# Patient Record
Sex: Female | Born: 1990 | Race: White | Hispanic: No | Marital: Single | Smoking: Current every day smoker
Health system: Southern US, Community
[De-identification: ages and names within clinical notes are randomized; demographics above are authoritative.]

## PROBLEM LIST (undated history)

## (undated) DIAGNOSIS — R569 Unspecified convulsions: Secondary | ICD-10-CM

## (undated) DIAGNOSIS — R519 Headache, unspecified: Secondary | ICD-10-CM

## (undated) DIAGNOSIS — M419 Scoliosis, unspecified: Secondary | ICD-10-CM

## (undated) DIAGNOSIS — R51 Headache: Secondary | ICD-10-CM

## (undated) HISTORY — PX: KNEE SURGERY: SHX244

## (undated) HISTORY — PX: BACK SURGERY: SHX140

---
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
## 2010-06-19 ENCOUNTER — Emergency Department (HOSPITAL_COMMUNITY)
Admission: EM | Admit: 2010-06-19 | Discharge: 2010-06-19 | Disposition: A | Discharge status: Home / Self Care | Payer: Self-pay | Attending: Emergency Medicine | Admitting: Emergency Medicine

## 2010-06-19 DIAGNOSIS — M25579 Pain in unspecified ankle and joints of unspecified foot: Secondary | ICD-10-CM | POA: Insufficient documentation

## 2010-06-19 DIAGNOSIS — M79609 Pain in unspecified limb: Secondary | ICD-10-CM | POA: Insufficient documentation

## 2010-07-03 LAB — URINALYSIS, ROUTINE W REFLEX MICROSCOPIC
Bilirubin Urine: NEGATIVE
Glucose, UA: NEGATIVE mg/dL
Hgb urine dipstick: NEGATIVE
Protein, ur: NEGATIVE mg/dL
pH: 7.5 (ref 5.0–8.0)

## 2010-07-03 LAB — BASIC METABOLIC PANEL
BUN: 6 mg/dL (ref 6–23)
Chloride: 109 mEq/L (ref 96–112)
GFR calc Af Amer: >60 mL/min (ref >60)
Glucose, Bld: 89 mg/dL (ref 70–99)

## 2010-07-03 LAB — POCT CARDIAC MARKERS
CKMB, poc: 2.7 ng/mL (ref 1.0–8.0)
Myoglobin, poc: 63.4 ng/mL (ref 12–200)
Troponin i, poc: <0.05 ng/mL (ref 0.00–0.09)

## 2010-07-03 LAB — CBC
HCT: 32.4 % — ABNORMAL LOW (ref 36.0–46.0)
MCV: 87.7 fL (ref 78.0–100.0)
RBC: 3.69 MIL/uL — ABNORMAL LOW (ref 3.87–5.11)
RDW: 12.5 % (ref 11.5–15.5)

## 2010-07-03 LAB — HEPATIC FUNCTION PANEL
Albumin: 4 g/dL (ref 3.5–5.2)
Alkaline Phosphatase: 72 U/L (ref 39–117)
Bilirubin, Direct: 0.1 mg/dL (ref 0.0–0.3)
Indirect Bilirubin: 0.4 mg/dL (ref 0.3–0.9)
Total Bilirubin: 0.5 mg/dL (ref 0.3–1.2)
Total Protein: 6.8 g/dL (ref 6.0–8.3)

## 2010-07-03 LAB — DIFFERENTIAL
Basophils Absolute: 0 10*3/uL (ref 0.0–0.1)
Eosinophils Absolute: 0.2 10*3/uL (ref 0.0–0.7)
Monocytes Absolute: 0.4 10*3/uL (ref 0.1–1.0)
Monocytes Relative: 7 % (ref 3–12)
Neutro Abs: 3.2 10*3/uL (ref 1.7–7.7)

## 2010-07-03 LAB — LIPASE, BLOOD: Lipase: 29 U/L (ref 11–59)

## 2010-07-03 LAB — PREGNANCY, URINE: Preg Test, Ur: NEGATIVE

## 2010-07-04 LAB — POCT PREGNANCY, URINE: Preg Test, Ur: NEGATIVE

## 2010-07-04 LAB — URINE CULTURE: Colony Count: NO GROWTH

## 2010-07-04 LAB — URINALYSIS, ROUTINE W REFLEX MICROSCOPIC: Specific Gravity, Urine: 1.015 (ref 1.005–1.030)

## 2010-07-07 LAB — URINALYSIS, ROUTINE W REFLEX MICROSCOPIC
Bilirubin Urine: NEGATIVE
Nitrite: NEGATIVE
Protein, ur: NEGATIVE mg/dL
Specific Gravity, Urine: 1.015 (ref 1.005–1.030)

## 2010-07-07 LAB — URINE MICROSCOPIC-ADD ON

## 2010-07-07 LAB — URINE CULTURE: Colony Count: 10000

## 2010-07-07 LAB — PREGNANCY, URINE: Preg Test, Ur: NEGATIVE

## 2011-02-11 ENCOUNTER — Emergency Department (HOSPITAL_COMMUNITY)
Admission: EM | Admit: 2011-02-11 | Discharge: 2011-02-11 | Discharge status: Against Medical Advice | Payer: Self-pay | Attending: Emergency Medicine | Admitting: Emergency Medicine

## 2011-02-11 ENCOUNTER — Emergency Department (HOSPITAL_COMMUNITY): Payer: Self-pay

## 2011-02-11 DIAGNOSIS — Z5320 Procedure and treatment not carried out because of patient's decision for unspecified reasons: Secondary | ICD-10-CM | POA: Insufficient documentation

## 2011-02-11 DIAGNOSIS — M25569 Pain in unspecified knee: Secondary | ICD-10-CM | POA: Insufficient documentation

## 2011-02-11 HISTORY — DX: Scoliosis, unspecified: M41.9

## 2011-02-11 NOTE — ED Provider Notes (Signed)
Pt left before full evaluation   Joya Gaskins, MD 02/11/11 1819

## 2011-02-11 NOTE — ED Notes (Signed)
Pt presents with left knee pain and swelling after falling yesterday. Pt to triage via w/c. NAD at this time.

## 2011-02-11 NOTE — ED Notes (Signed)
Assisted pt to bathroom, pt tolerated well.

## 2011-02-11 NOTE — ED Notes (Signed)
Pt states that she can't wait any longer, states that she has to be somewhere by 6pm, attempts to get pt to stay were unsuccessful, pt states that she needs to leave, will return if needed. ama form explained to and signed by pt

## 2011-02-11 NOTE — ED Notes (Signed)
Pt states that she was running to answer the phone yesterday when she missed a step and fell, pain to left knee area, cms intact distal

## 2011-07-06 ENCOUNTER — Emergency Department (HOSPITAL_COMMUNITY)
Admission: EM | Admit: 2011-07-06 | Discharge: 2011-07-06 | Disposition: A | Discharge status: Home / Self Care | Payer: Self-pay | Attending: Emergency Medicine | Admitting: Emergency Medicine

## 2011-07-06 ENCOUNTER — Encounter (HOSPITAL_COMMUNITY): Payer: Self-pay

## 2011-07-06 DIAGNOSIS — M4120 Other idiopathic scoliosis, site unspecified: Secondary | ICD-10-CM | POA: Insufficient documentation

## 2011-07-06 DIAGNOSIS — F17200 Nicotine dependence, unspecified, uncomplicated: Secondary | ICD-10-CM | POA: Insufficient documentation

## 2011-07-06 DIAGNOSIS — N390 Urinary tract infection, site not specified: Secondary | ICD-10-CM | POA: Insufficient documentation

## 2011-07-06 LAB — URINALYSIS, ROUTINE W REFLEX MICROSCOPIC
Glucose, UA: NEGATIVE mg/dL
Ketones, ur: NEGATIVE mg/dL
Nitrite: POSITIVE — AB
Specific Gravity, Urine: <1.005 — ABNORMAL LOW (ref 1.005–1.030)
Urobilinogen, UA: 0.2 mg/dL (ref 0.0–1.0)
pH: 6.5 (ref 5.0–8.0)

## 2011-07-06 MED ORDER — CIPROFLOXACIN HCL 250 MG PO TABS
500.0000 mg | ORAL_TABLET | Freq: Once | ORAL | Status: AC
  Administered 2011-07-06: 500 mg via ORAL
  Filled 2011-07-06: qty 2

## 2011-07-06 MED ORDER — CIPROFLOXACIN HCL 500 MG PO TABS: 500.0000 mg | ORAL_TABLET | Freq: Two times a day (BID) | ORAL | Status: AC

## 2011-07-06 MED ORDER — PHENAZOPYRIDINE HCL 100 MG PO TABS
200.0000 mg | ORAL_TABLET | Freq: Once | ORAL | Status: AC
  Administered 2011-07-06: 200 mg via ORAL
  Filled 2011-07-06: qty 2

## 2011-07-06 MED ORDER — PHENAZOPYRIDINE HCL 200 MG PO TABS: 200.0000 mg | ORAL_TABLET | Freq: Three times a day (TID) | ORAL | Status: AC

## 2011-07-06 NOTE — ED Provider Notes (Signed)
History   This chart was scribed for Celene Kras, MD by Sofie Rower. The patient was seen in room APA01/APA01 and the patient's care was started at 11:29PM.      CSN: [id number]  Arrival date & time 07/06/11  2212   None     Chief Complaint  Patient presents with  . Abdominal Pain  . Diarrhea    HPI  Judy Graham is a 21 y.o. female Judy Graham is a 21 y.o. female who presents to the Emergency Department complaining of moderate, constant abdominal pain onset today with assoicated symtoms of diarrhea, irregular periods, back pain. Pt has a hx of scoliosis. She denies any increasing pain with urination. She denies any fevers, vomiting diarrhea or constipation. Patient states she did have symptoms of urinary tract infection about a week ago but the symptoms went away.    Past Medical History  Diagnosis Date  . Scoliosis     Past Surgical History  Procedure Date  . Back surgery   . Knee surgery     No family history on file.  History  Substance Use Topics  . Smoking status: Current Everyday Smoker -- 0.5 packs/day  . Smokeless tobacco: Not on file  . Alcohol Use: No    OB History    Grav Para Term Preterm Abortions TAB SAB Ect Mult Living                  Review of Systems  All other systems reviewed and are negative.    10 Systems reviewed and are negative for acute change except as noted in the HPI.   Allergies  Clindamycin/lincomycin  Home Medications   Current Outpatient Rx  Name Route Sig Dispense Refill  . IBUPROFEN 200 MG PO TABS Oral Take 400-600 mg by mouth 3 (three) times daily as needed. FOR PAIN      BP 123/81  Pulse 107  Temp(Src) 98 F (36.7 C) (Oral)  Resp 24  Ht 5\' 3"  (1.6 m)  Wt 110 lb (49.896 kg)  BMI 19.49 kg/m2  SpO2 100%  LMP 06/22/2011  Physical Exam  Nursing note and vitals reviewed. Constitutional: She appears well-developed and well-nourished. No distress.  HENT:  Head: Normocephalic and atraumatic.  Right  Ear: External ear normal.  Left Ear: External ear normal.  Nose: Nose normal.  Eyes: Conjunctivae are normal. Right eye exhibits no discharge. Left eye exhibits no discharge. No scleral icterus.  Neck: Normal range of motion. Neck supple. No tracheal deviation present.  Cardiovascular: Normal rate, regular rhythm, normal heart sounds and intact distal pulses.   Pulmonary/Chest: Effort normal and breath sounds normal. No stridor. No respiratory distress. She has no wheezes. She has no rales.  Abdominal: Soft. Bowel sounds are normal. She exhibits no distension. There is tenderness (Mild suprapubic. ). There is no rebound and no guarding.  Musculoskeletal: She exhibits no edema and no tenderness.       Scoliosis.   Neurological: She is alert. She has normal strength. No sensory deficit. Cranial nerve deficit:  no gross defecits noted. She exhibits normal muscle tone. She displays no seizure activity. Coordination normal.  Skin: Skin is warm and dry. No rash noted.  Psychiatric: She has a normal mood and affect.    ED Course  Procedures (including critical care time)  DIAGNOSTIC STUDIES: Oxygen Saturation is 100% on room air, normal by my interpretation.    COORDINATION OF CARE:     Labs Reviewed  URINALYSIS, ROUTINE W  REFLEX MICROSCOPIC - Abnormal; Notable for the following:    APPearance CLOUDY (*)    Specific Gravity, Urine <1.005 (*)    Hgb urine dipstick LARGE (*)    Nitrite POSITIVE (*)    Leukocytes, UA SMALL (*)    All other components within normal limits  URINE MICROSCOPIC-ADD ON - Abnormal; Notable for the following:    Squamous Epithelial / LPF MANY (*)    Bacteria, UA MANY (*)    All other components within normal limits  POCT PREGNANCY, URINE   No results found.   1. UTI (lower urinary tract infection)     11:31PM- EDP at bedside discusses treatment plan.   MDM  Symptoms are consistent with a urinary tract infection. Patient was treated with a course of  Cipro and Pyridium.     I personally performed the services described in this documentation, which was scribed in my presence.  The recorded information has been reviewed and considered.    Celene Kras, MD 07/07/11 [phone]

## 2011-07-06 NOTE — ED Notes (Signed)
abd pain with diarrhea. C/o irregular periods as well

## 2011-07-06 NOTE — Discharge Instructions (Signed)

## 2011-07-06 NOTE — ED Notes (Signed)
Discharge instructions reviewed with pt, questions answered. Pt verbalized understanding.  

## 2011-07-06 NOTE — ED Notes (Signed)
Reports being on period today.

## 2011-07-06 NOTE — ED Notes (Signed)
Pt stated pain 6/10, pain radiates from mid abdomin to lower abdominal/pelvic region, pain on palpation. Hyperactive bowel sounds in all 4 quadrants.

## 2011-08-29 DIAGNOSIS — Z981 Arthrodesis status: Secondary | ICD-10-CM | POA: Insufficient documentation

## 2011-11-21 ENCOUNTER — Emergency Department (HOSPITAL_COMMUNITY): Payer: Self-pay

## 2011-11-21 ENCOUNTER — Emergency Department (HOSPITAL_COMMUNITY)
Admission: EM | Admit: 2011-11-21 | Discharge: 2011-11-21 | Disposition: A | Discharge status: Home / Self Care | Payer: Self-pay | Attending: Emergency Medicine | Admitting: Emergency Medicine

## 2011-11-21 ENCOUNTER — Encounter (HOSPITAL_COMMUNITY): Payer: Self-pay | Admitting: *Deleted

## 2011-11-21 DIAGNOSIS — R109 Unspecified abdominal pain: Secondary | ICD-10-CM | POA: Insufficient documentation

## 2011-11-21 DIAGNOSIS — R51 Headache: Secondary | ICD-10-CM | POA: Insufficient documentation

## 2011-11-21 DIAGNOSIS — F17200 Nicotine dependence, unspecified, uncomplicated: Secondary | ICD-10-CM | POA: Insufficient documentation

## 2011-11-21 LAB — URINE MICROSCOPIC-ADD ON

## 2011-11-21 LAB — CBC WITH DIFFERENTIAL/PLATELET
Basophils Absolute: 0 10*3/uL (ref 0.0–0.1)
Basophils Relative: 0 % (ref 0–1)
Eosinophils Absolute: 0.1 10*3/uL (ref 0.0–0.7)
Eosinophils Relative: 1 % (ref 0–5)
HCT: 40.9 % (ref 36.0–46.0)
Lymphocytes Relative: 25 % (ref 12–46)
MCH: 30.4 pg (ref 26.0–34.0)
MCHC: 33.5 g/dL (ref 30.0–36.0)
MCV: 90.9 fL (ref 78.0–100.0)
Monocytes Absolute: 0.5 10*3/uL (ref 0.1–1.0)
Platelets: 297 10*3/uL (ref 150–400)
RDW: 13.5 % (ref 11.5–15.5)
WBC: 9 10*3/uL (ref 4.0–10.5)

## 2011-11-21 LAB — COMPREHENSIVE METABOLIC PANEL
ALT: 10 U/L (ref 0–35)
AST: 23 U/L (ref 0–37)
Albumin: 4.2 g/dL (ref 3.5–5.2)
Calcium: 9.4 mg/dL (ref 8.4–10.5)
Creatinine, Ser: 0.61 mg/dL (ref 0.50–1.10)
GFR calc non Af Amer: >90 mL/min (ref >90)
Sodium: 136 mEq/L (ref 135–145)
Total Protein: 7.6 g/dL (ref 6.0–8.3)

## 2011-11-21 LAB — URINALYSIS, ROUTINE W REFLEX MICROSCOPIC
Glucose, UA: NEGATIVE mg/dL
Hgb urine dipstick: NEGATIVE
Ketones, ur: NEGATIVE mg/dL
Protein, ur: NEGATIVE mg/dL
Urobilinogen, UA: 0.2 mg/dL (ref 0.0–1.0)

## 2011-11-21 LAB — POCT PREGNANCY, URINE: Preg Test, Ur: NEGATIVE

## 2011-11-21 MED ORDER — METOCLOPRAMIDE HCL 5 MG/ML IJ SOLN
10.0000 mg | Freq: Once | INTRAMUSCULAR | Status: AC
  Administered 2011-11-21: 10 mg via INTRAVENOUS
  Filled 2011-11-21: qty 2

## 2011-11-21 MED ORDER — DIPHENHYDRAMINE HCL 50 MG/ML IJ SOLN
25.0000 mg | Freq: Once | INTRAMUSCULAR | Status: AC
  Administered 2011-11-21: 25 mg via INTRAVENOUS
  Filled 2011-11-21: qty 1

## 2011-11-21 MED ORDER — KETOROLAC TROMETHAMINE 30 MG/ML IJ SOLN
30.0000 mg | Freq: Once | INTRAMUSCULAR | Status: AC
  Administered 2011-11-21: 30 mg via INTRAVENOUS
  Filled 2011-11-21: qty 1

## 2011-11-21 MED ORDER — SODIUM CHLORIDE 0.9 % IV SOLN
Freq: Once | INTRAVENOUS | Status: AC
  Administered 2011-11-21: 14:00:00 via INTRAVENOUS

## 2011-11-21 NOTE — ED Notes (Signed)
Pt c/o headache with n/v since last night. 

## 2011-11-21 NOTE — ED Notes (Signed)
RN at bedside

## 2011-11-21 NOTE — ED Notes (Signed)
Headache, N/V, abd pain, onset last night.

## 2011-11-21 NOTE — ED Provider Notes (Signed)
History     CSN: [id number]  Arrival date & time 11/21/11  1149   First MD Initiated Contact with Patient 11/21/11 1249      Chief Complaint  Patient presents with  . Headache    (Consider location/radiation/quality/duration/timing/severity/associated sxs/prior treatment) HPI Comments: The patient is a 21 year old woman who benefit her stomach is hurting and she's had vomiting and headache. This started last night. She couldn't sleep because of her pain and headache. She did not eat anything that she thought made her sick.   She hasn't taken anything for her symptoms.  Patient is a 21 y.o. female presenting with abdominal pain. The history is provided by the patient. No language interpreter was used.  Abdominal Pain The primary symptoms of the illness include abdominal pain and nausea. The primary symptoms of the illness do not include fever, vomiting or diarrhea. Primary symptoms comment: Headache The current episode started 6 to 12 hours ago. The onset of the illness was gradual. The problem has not changed since onset. The abdominal pain began 6 to 12 hours ago. The pain came on gradually. The abdominal pain has been unchanged since its onset. The abdominal pain does not radiate. The severity of the abdominal pain is 6/10. The abdominal pain is relieved by nothing. Exacerbated by: Nothing.  Associated with: Headache, felt at the vertex. Symptoms associated with the illness do not include chills.    Past Medical History  Diagnosis Date  . Scoliosis     Past Surgical History  Procedure Date  . Back surgery   . Knee surgery     History reviewed. No pertinent family history.  History  Substance Use Topics  . Smoking status: Current Everyday Smoker -- 0.5 packs/day  . Smokeless tobacco: Not on file  . Alcohol Use: No    OB History    Grav Para Term Preterm Abortions TAB SAB Ect Mult Living                  Review of Systems  Constitutional: Negative.  Negative for  fever and chills.  Eyes: Negative.   Respiratory: Negative.   Cardiovascular: Negative.   Gastrointestinal: Positive for nausea and abdominal pain. Negative for vomiting and diarrhea.  Genitourinary: Negative.   Musculoskeletal: Negative.   Skin: Negative.   Neurological: Positive for headaches.  Psychiatric/Behavioral: Negative.     Allergies  Clindamycin/lincomycin  Home Medications   Current Outpatient Rx  Name Route Sig Dispense Refill  . DULOXETINE HCL 30 MG PO CPEP Oral Take 30 mg by mouth daily.    . IBUPROFEN 200 MG PO TABS Oral Take 400-600 mg by mouth 3 (three) times daily as needed. FOR PAIN      BP 133/91  Pulse 81  Temp 97.9 F (36.6 C) (Oral)  Resp 17  Ht 5\' 3"  (1.6 m)  Wt 100 lb (45.36 kg)  BMI 17.71 kg/m2  SpO2 99%  LMP 10/20/2011  Physical Exam  Nursing note and vitals reviewed. Constitutional: She is oriented to person, place, and time.       Slender young woman in mild distress complaining of headache and abdominal pain.  HENT:  Head: Normocephalic and atraumatic.  Right Ear: External ear normal.  Left Ear: External ear normal.  Nose: Nose normal.  Mouth/Throat: Oropharynx is clear and moist.  Eyes: Conjunctivae and EOM are normal. Pupils are equal, round, and reactive to light.  Neck: Normal range of motion. Neck supple.  Cardiovascular: Normal rate, regular rhythm and normal  heart sounds.   Pulmonary/Chest: Effort normal and breath sounds normal.  Abdominal: Soft. Bowel sounds are normal. She exhibits no distension. There is no tenderness. There is no rebound.  Musculoskeletal: Normal range of motion.  Lymphadenopathy:    She has no cervical adenopathy.  Neurological: She is alert and oriented to person, place, and time.       No sensory or motor deficit.  Skin: Skin is warm and dry. No erythema.  Psychiatric: She has a normal mood and affect. Her behavior is normal.    ED Course  Procedures (including critical care time)   Labs  Reviewed  URINALYSIS, ROUTINE W REFLEX MICROSCOPIC  CBC WITH DIFFERENTIAL  COMPREHENSIVE METABOLIC PANEL  LIPASE, BLOOD   1:10 PM Pt seen --> physical exam performed.  Lab workup ordered.  Antiemetics and pain medicatons ordered.  3:05 PM Results for orders placed during the hospital encounter of 11/21/11  URINALYSIS, ROUTINE W REFLEX MICROSCOPIC      Component Value Range   Color, Urine YELLOW  YELLOW   APPearance HAZY (*) CLEAR   Specific Gravity, Urine <1.005 (*) 1.005 - 1.030   pH 6.0  5.0 - 8.0   Glucose, UA NEGATIVE  NEGATIVE mg/dL   Hgb urine dipstick NEGATIVE  NEGATIVE   Bilirubin Urine NEGATIVE  NEGATIVE   Ketones, ur NEGATIVE  NEGATIVE mg/dL   Protein, ur NEGATIVE  NEGATIVE mg/dL   Urobilinogen, UA 0.2  0.0 - 1.0 mg/dL   Nitrite NEGATIVE  NEGATIVE   Leukocytes, UA SMALL (*) NEGATIVE  CBC WITH DIFFERENTIAL      Component Value Range   WBC 9.0  4.0 - 10.5 K/uL   RBC 4.50  3.87 - 5.11 MIL/uL   Hemoglobin 13.7  12.0 - 15.0 g/dL   HCT 09.8  11.9 - 14.7 %   MCV 90.9  78.0 - 100.0 fL   MCH 30.4  26.0 - 34.0 pg   MCHC 33.5  30.0 - 36.0 g/dL   RDW 82.9  56.2 - 13.0 %   Platelets 297  150 - 400 K/uL   Neutrophils Relative 69  43 - 77 %   Neutro Abs 6.2  1.7 - 7.7 K/uL   Lymphocytes Relative 25  12 - 46 %   Lymphs Abs 2.2  0.7 - 4.0 K/uL   Monocytes Relative 5  3 - 12 %   Monocytes Absolute 0.5  0.1 - 1.0 K/uL   Eosinophils Relative 1  0 - 5 %   Eosinophils Absolute 0.1  0.0 - 0.7 K/uL   Basophils Relative 0  0 - 1 %   Basophils Absolute 0.0  0.0 - 0.1 K/uL  COMPREHENSIVE METABOLIC PANEL      Component Value Range   Sodium 136  135 - 145 mEq/L   Potassium 3.2 (*) 3.5 - 5.1 mEq/L   Chloride 101  96 - 112 mEq/L   CO2 27  19 - 32 mEq/L   Glucose, Bld 99  70 - 99 mg/dL   BUN 7  6 - 23 mg/dL   Creatinine, Ser 8.65  0.50 - 1.10 mg/dL   Calcium 9.4  8.4 - 78.4 mg/dL   Total Protein 7.6  6.0 - 8.3 g/dL   Albumin 4.2  3.5 - 5.2 g/dL   AST 23  0 - 37 U/L   ALT 10  0 -  35 U/L   Alkaline Phosphatase 88  39 - 117 U/L   Total Bilirubin 0.6  0.3 - 1.2  mg/dL   GFR calc non Af Amer >90  >90 mL/min   GFR calc Af Amer >90  >90 mL/min  LIPASE, BLOOD      Component Value Range   Lipase 206 (*) 11 - 59 U/L  POCT PREGNANCY, URINE      Component Value Range   Preg Test, Ur NEGATIVE  NEGATIVE  URINE MICROSCOPIC-ADD ON      Component Value Range   Squamous Epithelial / LPF FEW (*) RARE   WBC, UA 3-6  <3 WBC/hpf   RBC / HPF 0-2  <3 RBC/hpf   Bacteria, UA FEW (*) RARE   Dg Abd Acute W/chest  11/21/2011  *RADIOLOGY REPORT*  Clinical Data: Lower abdominal pain.  ACUTE ABDOMEN SERIES (ABDOMEN 2 VIEW & CHEST 1 VIEW)  Comparison: Chest x-ray 11/11/2009  Findings: Spinal hardware again noted, unchanged.  The bowel gas pattern is normal.  There is no evidence of free intraperitoneal air.  No suspicious radio-opaque calculi or other significant radiographic abnormality is seen. Heart size and mediastinal contours are within normal limits.  Both lungs are clear.  IMPRESSION: No acute findings.  Original Report Authenticated By: Cyndie Chime, M.D.    Lab tests showed lipase elevated at 206.  Lab workup otherwise negative.  She is a nondrinker so it is unclear why her lipase is elevated.  She feels better, no further headache or abdominal pain.  Released, with advice to rest in darkened room, clear liquids today.   1. Headache   2. Abdominal pain            Carleene Cooper III, MD 11/21/11 [phone]

## 2011-11-21 NOTE — ED Notes (Signed)
Family at bedside. 

## 2012-07-10 ENCOUNTER — Emergency Department (HOSPITAL_COMMUNITY)
Admission: EM | Admit: 2012-07-10 | Discharge: 2012-07-10 | Discharge status: Against Medical Advice | Payer: Self-pay | Attending: Emergency Medicine | Admitting: Emergency Medicine

## 2012-07-10 ENCOUNTER — Encounter (HOSPITAL_COMMUNITY): Payer: Self-pay | Admitting: *Deleted

## 2012-07-10 DIAGNOSIS — R1110 Vomiting, unspecified: Secondary | ICD-10-CM | POA: Insufficient documentation

## 2012-07-10 NOTE — ED Notes (Signed)
No answer

## 2012-07-10 NOTE — ED Notes (Signed)
Onset this am with vomiting and diarrhea.

## 2013-09-01 DIAGNOSIS — F4329 Adjustment disorder with other symptoms: Secondary | ICD-10-CM | POA: Insufficient documentation

## 2013-09-05 DIAGNOSIS — F419 Anxiety disorder, unspecified: Secondary | ICD-10-CM | POA: Insufficient documentation

## 2013-10-24 ENCOUNTER — Emergency Department (HOSPITAL_COMMUNITY)
Admission: EM | Admit: 2013-10-24 | Discharge: 2013-10-24 | Disposition: A | Discharge status: Home / Self Care | Payer: Self-pay | Attending: Emergency Medicine | Admitting: Emergency Medicine

## 2013-10-24 ENCOUNTER — Encounter (HOSPITAL_COMMUNITY): Payer: Self-pay | Admitting: Emergency Medicine

## 2013-10-24 DIAGNOSIS — F17200 Nicotine dependence, unspecified, uncomplicated: Secondary | ICD-10-CM | POA: Insufficient documentation

## 2013-10-24 DIAGNOSIS — K029 Dental caries, unspecified: Secondary | ICD-10-CM | POA: Insufficient documentation

## 2013-10-24 DIAGNOSIS — Z79899 Other long term (current) drug therapy: Secondary | ICD-10-CM | POA: Insufficient documentation

## 2013-10-24 DIAGNOSIS — Z8739 Personal history of other diseases of the musculoskeletal system and connective tissue: Secondary | ICD-10-CM | POA: Insufficient documentation

## 2013-10-24 DIAGNOSIS — K089 Disorder of teeth and supporting structures, unspecified: Secondary | ICD-10-CM | POA: Insufficient documentation

## 2013-10-24 DIAGNOSIS — K0889 Other specified disorders of teeth and supporting structures: Secondary | ICD-10-CM

## 2013-10-24 MED ORDER — HYDROCODONE-ACETAMINOPHEN 5-325 MG PO TABS
2.0000 | ORAL_TABLET | Freq: Once | ORAL | Status: AC
  Administered 2013-10-24: 2 via ORAL
  Filled 2013-10-24: qty 2

## 2013-10-24 MED ORDER — IBUPROFEN 800 MG PO TABS: 800.0000 mg | ORAL_TABLET | Freq: Three times a day (TID) | ORAL | Status: DC

## 2013-10-24 MED ORDER — PENICILLIN V POTASSIUM 500 MG PO TABS: 500.0000 mg | ORAL_TABLET | Freq: Four times a day (QID) | ORAL | Status: DC

## 2013-10-24 MED ORDER — HYDROCODONE-ACETAMINOPHEN 5-325 MG PO TABS: 1.0000 | ORAL_TABLET | ORAL | Status: DC | PRN

## 2013-10-24 NOTE — ED Provider Notes (Signed)
CSN: [id number]     Arrival date & time 10/24/13  1031 History   First MD Initiated Contact with Patient 10/24/13 1033     Chief Complaint  Patient presents with  . Dental Pain   HPI  Batoul Limes is a 23 y.o. female with a PMH of scoliosis who presents to the ED for evaluation of dental pain. History was provided by the patient. Patient complains of left upper dental pain which began last night. Denies any injuries or trauma. Patient complains of a constant throbbing pain. Pain not controlled with OTC medications at home. Patient does not have a dentist. Denies any fever, chills, neck pain, difficulty swallowing/breathing. Patient is a smoker.    Past Medical History  Diagnosis Date  . Scoliosis    Past Surgical History  Procedure Laterality Date  . Back surgery    . Knee surgery     History reviewed. No pertinent family history. History  Substance Use Topics  . Smoking status: Current Every Day Smoker -- 0.50 packs/day  . Smokeless tobacco: Not on file  . Alcohol Use: Not on file     Comment: occ   OB History   Grav Para Term Preterm Abortions TAB SAB Ect Mult Living                 Review of Systems  Constitutional: Negative for fever, chills, activity change, appetite change and fatigue.  HENT: Positive for dental problem. Negative for facial swelling.   Gastrointestinal: Negative for nausea, vomiting and abdominal pain.  Musculoskeletal: Negative for neck pain.  Neurological: Negative for weakness and headaches.    Allergies  Clindamycin/lincomycin  Home Medications   Prior to Admission medications   Medication Sig Start Date End Date Taking? Authorizing Provider  DULoxetine (CYMBALTA) 30 MG capsule Take 30 mg by mouth daily.    Historical Provider, MD  ibuprofen (ADVIL,MOTRIN) 200 MG tablet Take 400-600 mg by mouth 3 (three) times daily as needed. FOR PAIN    Historical Provider, MD   BP 127/90  Pulse 88  Temp(Src) 98.1 F (36.7 C) (Oral)  Resp 18  SpO2  98%  Filed Vitals:   10/24/13 1040 10/24/13 1107  BP: 127/90   Pulse: 88 93  Temp: 98.1 F (36.7 C)   TempSrc: Oral   Resp: 18   SpO2: 98% 100%    Physical Exam  Nursing note and vitals reviewed. Constitutional: She is oriented to person, place, and time. She appears well-developed and well-nourished. No distress.  Non-toxic. Tearful  HENT:  Head: Normocephalic and atraumatic.  Right Ear: External ear normal.  Left Ear: External ear normal.  Nose: Nose normal.  Mouth/Throat: Oropharynx is clear and moist. No oropharyngeal exudate.    Dental cary present in the left upper molar. No dental abscess or facial swelling. Poor dentition throughout. No erythema to the posterior pharynx. Tonsils without edema or exudates. Uvula midline. No trismus. No difficulty controlling secretions. Tympanic membranes gray and translucent bilaterally with no erythema, edema, or hemotympanum.  No mastoid or tragal tenderness bilaterally.   Eyes: Conjunctivae and EOM are normal. Pupils are equal, round, and reactive to light. Right eye exhibits no discharge. Left eye exhibits no discharge.  Neck: Normal range of motion. Neck supple.  No cervical lymphadenopathy. No nuchal rigidity. No submental fullness  Cardiovascular: Normal rate, regular rhythm and normal heart sounds.  Exam reveals no gallop and no friction rub.   No murmur heard. Pulmonary/Chest: Effort normal and breath sounds normal. No  respiratory distress. She has no wheezes. She has no rales. She exhibits no tenderness.  Abdominal: Soft.  Musculoskeletal: Normal range of motion. She exhibits no edema and no tenderness.  Neurological: She is alert and oriented to person, place, and time.  Skin: Skin is warm and dry. She is not diaphoretic.     ED Course  Procedures (including critical care time) Labs Review Labs Reviewed - No data to display  Imaging Review No results found.   EKG Interpretation None      MDM   Rodney Cruiseicole Dosh  is a 23 y.o. female with a PMH of scoliosis who presents to the ED for evaluation of dental pain. Etiology of dental pain likely due to dental caries. No evidence of a dental abscess at this time. No concerning signs/symptoms for Ludwig's Angina at this time. Patient afebrile and non-toxic in appearance. Patient instructed to follow-up with a dentist for further evaluation and management.  Resources provided. Return precautions were given. Patient in agreement with discharge and plan.    New Prescriptions   HYDROCODONE-ACETAMINOPHEN (NORCO/VICODIN) 5-325 MG PER TABLET    Take 1-2 tablets by mouth every 4 (four) hours as needed for moderate pain or severe pain.   IBUPROFEN (ADVIL,MOTRIN) 800 MG TABLET    Take 1 tablet (800 mg total) by mouth 3 (three) times daily.   PENICILLIN V POTASSIUM (VEETID) 500 MG TABLET    Take 1 tablet (500 mg total) by mouth 4 (four) times daily.     Final impressions: 1. Pain, dental      Greer EeJessica Katlin Mirayah Wren PA-C           Jillyn LedgerJessica K Arvin Abello, PA-C 10/25/13 1540

## 2013-10-24 NOTE — Discharge Instructions (Signed)
Take ibuprofen for mild-moderate pain - this will help reduce swelling  Take vicodin for severe pain - Please be careful with this medication.  It can cause drowsiness.  Use caution while driving, operating machinery, drinking alcohol, or any other activities that may impair your physical or mental abilities.   Take penicillin antibiotic for full dose  Return to the emergency department if you develop any changing/worsening condition, fever, difficulty swallowing/breathing, or any other concerns (please read additional information regarding your condition below)  Dental Pain A tooth ache may be caused by cavities (tooth decay). Cavities expose the nerve of the tooth to air and hot or cold temperatures. It may come from an infection or abscess (also called a boil or furuncle) around your tooth. It is also often caused by dental caries (tooth decay). This causes the pain you are having. DIAGNOSIS  Your caregiver can diagnose this problem by exam. TREATMENT   If caused by an infection, it may be treated with medications which kill germs (antibiotics) and pain medications as prescribed by your caregiver. Take medications as directed.  Only take over-the-counter or prescription medicines for pain, discomfort, or fever as directed by your caregiver.  Whether the tooth ache today is caused by infection or dental disease, you should see your dentist as soon as possible for further care. SEEK MEDICAL CARE IF: The exam and treatment you received today has been provided on an emergency basis only. This is not a substitute for complete medical or dental care. If your problem worsens or new problems (symptoms) appear, and you are unable to meet with your dentist, call or return to this location. SEEK IMMEDIATE MEDICAL CARE IF:   You have a fever.  You develop redness and swelling of your face, jaw, or neck.  You are unable to open your mouth.  You have severe pain uncontrolled by pain medicine. MAKE  SURE YOU:   Understand these instructions.  Will watch your condition.  Will get help right away if you are not doing well or get worse. Document Released: 04/04/2005 Document Revised: 06/27/2011 Document Reviewed: 11/21/2007 [facility] Patient Information 2015 [place], [place]. This information is not intended to replace advice given to you by your health care provider. Make sure you discuss any questions you have with your health care provider.   Emergency Department Resource Guide 1) Find a Doctor and Pay Out of Pocket Although you won't have to find out who is covered by your insurance plan, it is a good idea to ask around and get recommendations. You will then need to call the office and see if the doctor you have chosen will accept you as a new patient and what types of options they offer for patients who are self-pay. Some doctors offer discounts or will set up payment plans for their patients who do not have insurance, but you will need to ask so you aren't surprised when you get to your appointment.  2) Contact Your Local Health Department Not all health departments have doctors that can see patients for sick visits, but many do, so it is worth a call to see if yours does. If you don't know where your local health department is, you can check in your phone book. The CDC also has a tool to help you locate your state's health department, and many state websites also have listings of all of their local health departments.  3) Find a Walk-in Clinic If your illness is not likely to be very severe or  complicated, you may want to try a walk in clinic. These are popping up all over the country in pharmacies, drugstores, and shopping centers. They're usually staffed by nurse practitioners or physician assistants that have been trained to treat common illnesses and complaints. They're usually fairly quick and inexpensive. However, if you have serious medical issues or chronic medical problems, these  are probably not your best option.  No Primary Care Doctor: - Call Health Connect at  [phone] - they can help you locate a primary care doctor that  accepts your insurance, provides certain services, etc. - Physician Referral Service- [phone]  Chronic Pain Problems: Organization         Address  Phone   Notes  [facility]  [phone] Patients need to be referred by their primary care doctor.   Medication Assistance: Organization         Address  Phone   Notes  [facility] [address] [place], [place] [address] [phone](336) [phone] --Must be a resident of [facility] -- Must have NO insurance coverage whatsoever (no Medicaid/ Medicare, etc.) -- The pt. MUST have a primary care doctor that directs their care regularly and follows them in the community   MedAssist  [phone]   [employer]  [phone]) [phone]    Agencies that provide inexpensive medical care: Organization         Address  Phone   Notes  [facility]  [phone]   [facility]    [phone]   [facility] [address] [place], [place] [address] [phone]   [facility] [address], [place] [phone]) [phone]   Planned Parenthood    [phone]   [facility]    [phone]   [facility]  [address], [place] Phone:  [phone]) [phone], Fax:  [phone]) [phone] Hours of Operation:  9 am - 6 pm, M-F.  Also accepts Medicaid/Medicare and self-pay.  [facility]  [address], [place] Phone: [phone](336) [phone], Fax: [phone]. Hours of Operation:  8:30 am - 5:30 pm, M-F.  Also accepts Medicaid and self-pay.  [facility] [place] [address], [place] Phone: [phone]) [phone]   [facility] [address], [place], [place] [phone], Ext. [phone] Mondays &  Thursdays: 7-9 AM.  First 15 patients are seen on a first come, first serve basis.    Medicaid-accepting [facility] Providers:  Organization         Address  Phone   Notes  [facility] [address], [place] [phone] Also accepts self-pay patients.  [facility] [address] [place], [place]  [phone]) [phone]   [facility] [address], [place] [phone]   [facility] [address] [place] [address], [place] [phone]) [phone]   Renaye RakersVeita Bland [address], [place]   [phone] Only accepts [place] Access [place] patients after they have their name applied to their card.   Self-Pay (no insurance) in [facility] [place]:  Organization         Address  Phone   Notes  Sickle Cell Patients, [facility] [address], [place] [phone]) [phone]   [facility] [address] [place] [phone]   [facility] [place]  [address], [place] [phone]   [facility]/Dr. Osei-Bonsu  [address] [place] or [address], [place] [phone] Phone number for both [place] and [place] locations is the same.  [facility] [address] [place] [phone]   [facility] [address], [place] or [address] [phone]   [facility] [address] [place] [phone], phone; [phone], fax Sees patients 1st and 3rd Saturday of every month.  Must not qualify for public or private insurance (i.e. Medicaid, Medicare, [place] Health Choice, Veterans' Benefits)  Household income should be no more than 200% of the poverty level The clinic cannot treat you if you are pregnant or think you are pregnant  Sexually transmitted diseases are not treated at the  clinic.    Dental Care: Organization         Address  Phone  Notes  [facility] [address] [place], [place] [phone] Accepts children up to age 23 who are enrolled in [place] or [place] Health Choice; pregnant women with a Medicaid card; and children who have applied for Medicaid or [place] Health Choice, but were declined, whose parents can pay a reduced fee at time of service.  [facility]  [address], [place] [phone] Accepts children up to age 2 who are enrolled in [place] or [place] Health Choice; pregnant women with a Medicaid card; and children who have applied for Medicaid or [place] Health Choice, but were declined, whose parents can pay a reduced fee at time of service.  [facility]  [address] [place], [place] [phone] Patients are seen by appointment only. Walk-ins are not accepted. [facility] will see patients 37 years of age and older. Monday - Tuesday (8am-5pm) Most Wednesdays (8:30-5pm) $30 per visit, cash only  [facility] Access [facility]  [address], [facility] [phone] Patients are seen by appointment only. Walk-ins are not accepted. [facility] will see patients 56 years of age and older. One Wednesday Evening (Monthly: Volunteer Based).  $30 per visit, cash only  [employer]  [phone] for adults; Children under age 88, call Graduate Pediatric Dentistry at [phone]. Children aged 62-14, please call [phone] to request a pediatric application.  Dental services are provided in all areas of dental care including fillings, crowns and bridges, complete and partial dentures, implants, gum treatment, root canals, and extractions. Preventive care is also provided. Treatment is provided to both adults and children. Patients are selected via a lottery and there is often a  waiting list.   [facility] [address] [place]  [phone] [email or web address]   [facility] [address] [place], [place] [phone], Ext. [phone] Second and Fourth Thursday of each month, opens at 6:30 AM; Clinic ends at 9 AM.  Patients are seen on a first-come first-served basis, and a limited number are seen during each clinic.   [facility]  [address] Ether [place], [place] [phone]   Eligibility Requirements You must have lived in [place], [place], or [place] counties for at least the last three months.   You cannot be  eligible for state or federal sponsored National Cityhealthcare insurance, including CIGNAVeterans Administration, [place]icaid, or [employer]Medicare.   You generally cannot be eligible for healthcare insurance through your employer.    How to apply: Eligibility screenings are held every Tuesday and Wednesday afternoon from 1:00 pm until 4:00 pm. You do not need an appointment for the interview!  [facility] [address], [place], [place] [phone]   [facility]  [phone]   [facility]  [phone]   [facility]  [phone]    Behavioral Health Resources in the Community: Intensive Outpatient Programs Organization         Address  Phone  Notes  [facility] [address], [place], [place] [phone]   [facility] [address], [place], [place] [phone]   [facility] [address], [place], [place]  [phone]   [facility] [address],  [place], [place] [phone] or [phone]   Substance Abuse Resources Organization         Address  Phone  Notes  [facility]  [phone]   [facility]  [phone]   [facility]  [phone]   [facility]  [phone]   Residential & Outpatient Substance Abuse  Program  [phone]   Psychological Services Organization         Address  Phone  Notes  [facility]  [phone]- [phone]   [facility]  [phone]- [phone]   [facility] [address], [place] [phone] or [phone]    Mobile Crisis Teams Organization         Address  Phone  Notes  [facility], Mobile Crisis Care Unit  [phone]   [facility]  [address] [place], [place] [phone]   Doristine LocksSharon DeEsch [address] [place] [phone]    Self-Help/Support Groups Organization         Address  Phone             Notes  [facility] [place] - variety of support groups  [phone] Call for more information  Narcotics Anonymous (NA), [facility] [address], [place]  2 meetings at this location   Statisticianesidential Treatment Programs Organization         Address  Phone  Notes  [facility] [address],    [place]  [phone]   [facility]  [address], [place] [phone], [place], [place] [phone]   [facility] [address], [place] [phone] Admissions: 8am-3pm M-F  [facility] [address]    [place], [place] [phone]   [facility] [address], [place], [place] [phone]   [facility] [address]  [place], [place] [phone]   [facility] - Intensive Outpatient [address], [place], [place] [phone]   [facility] ([facility] [address]  [place], [place] [phone] or [phone]   [facility] ([facility]) [address] [place], [place] [phone] Accepts Medicaid  [facility] [address]  [place] [phone] Substance Abuse/Addiction Treatment   [facility]al Health Resources Organization          Address  Phone  Notes  [facility]  [phone]) [phone]   Angie FavaJulie Brannon, PhD [phone]  [place], [place]   [phone] or [phone]   [facility]   [address] [place], [place] [phone]   [address], [place], [place] [phone] Insurance/Medicaid/sponsorship through [facility] and Families [address], [address]                                    [place], [place] [phone] [place] [address], [place] [phone]    Dr. Adele Schilder  [phone]   [facility] Dept. 1) [address], [place] 2) [place] 3)  [address], [place] [phone]  [phone]   [place] [phone] or [phone] (After Hours)

## 2013-10-24 NOTE — ED Notes (Signed)
Pt states that she has been having lt sided dental pain since last night.

## 2013-10-25 ENCOUNTER — Emergency Department (HOSPITAL_COMMUNITY)
Admission: EM | Admit: 2013-10-25 | Discharge: 2013-10-25 | Disposition: A | Discharge status: Home / Self Care | Payer: Self-pay | Attending: Emergency Medicine | Admitting: Emergency Medicine

## 2013-10-25 DIAGNOSIS — Z792 Long term (current) use of antibiotics: Secondary | ICD-10-CM | POA: Insufficient documentation

## 2013-10-25 DIAGNOSIS — F17200 Nicotine dependence, unspecified, uncomplicated: Secondary | ICD-10-CM | POA: Insufficient documentation

## 2013-10-25 DIAGNOSIS — Z8739 Personal history of other diseases of the musculoskeletal system and connective tissue: Secondary | ICD-10-CM | POA: Insufficient documentation

## 2013-10-25 DIAGNOSIS — K047 Periapical abscess without sinus: Secondary | ICD-10-CM | POA: Insufficient documentation

## 2013-10-25 DIAGNOSIS — Z791 Long term (current) use of non-steroidal anti-inflammatories (NSAID): Secondary | ICD-10-CM | POA: Insufficient documentation

## 2013-10-25 DIAGNOSIS — K029 Dental caries, unspecified: Secondary | ICD-10-CM | POA: Insufficient documentation

## 2013-10-25 MED ORDER — OXYCODONE-ACETAMINOPHEN 5-325 MG PO TABS: 1.0000 | ORAL_TABLET | ORAL | Status: DC | PRN

## 2013-10-25 MED ORDER — AMOXICILLIN-POT CLAVULANATE 875-125 MG PO TABS: 1.0000 | ORAL_TABLET | Freq: Two times a day (BID) | ORAL | Status: DC

## 2013-10-25 MED ORDER — OXYCODONE-ACETAMINOPHEN 5-325 MG PO TABS
1.0000 | ORAL_TABLET | Freq: Once | ORAL | Status: AC
  Administered 2013-10-25: 1 via ORAL
  Filled 2013-10-25: qty 1

## 2013-10-25 NOTE — ED Provider Notes (Signed)
Medical screening examination/treatment/procedure(s) were performed by non-physician practitioner and as supervising physician I was immediately available for consultation/collaboration.   EKG Interpretation None        Jayden Kratochvil M Cyrah Mclamb, MD 10/25/13 1608 

## 2013-10-25 NOTE — Discharge Planning (Signed)
[facility] Community Liaison  Spoke to patient regarding primary care resources and the [facility] orange card. Orange card application and instructions provided. Resource guide and my contact information also provided for any future questions or concerns. No other Community Liaison needs identified at this time.

## 2013-10-25 NOTE — ED Notes (Signed)
PA at the bedside.

## 2013-10-25 NOTE — Discharge Instructions (Signed)
Continue to take penicillin - start augmentin if symptoms not improving or worsening  Continue to take Ibuprofen  Take percocet for severe pain as needed  Return to the emergency department if you develop any changing/worsening condition, fever, difficulty swallowing/breathing, or any other concerns (please read additional information regarding your condition below)  Dental Abscess A dental abscess is a collection of infected fluid (pus) from a bacterial infection in the inner part of the tooth (pulp). It usually occurs at the end of the tooth's root.  CAUSES   Severe tooth decay.  Trauma to the tooth that allows bacteria to enter into the pulp, such as a broken or chipped tooth. SYMPTOMS   Severe pain in and around the infected tooth.  Swelling and redness around the abscessed tooth or in the mouth or face.  Tenderness.  Pus drainage.  Bad breath.  Bitter taste in the mouth.  Difficulty swallowing.  Difficulty opening the mouth.  Nausea.  Vomiting.  Chills.  Swollen neck glands. DIAGNOSIS   A medical and dental history will be taken.  An examination will be performed by tapping on the abscessed tooth.  X-rays may be taken of the tooth to identify the abscess. TREATMENT The goal of treatment is to eliminate the infection. You may be prescribed antibiotic medicine to stop the infection from spreading. A root canal may be performed to save the tooth. If the tooth cannot be saved, it may be pulled (extracted) and the abscess may be drained.  HOME CARE INSTRUCTIONS  Only take over-the-counter or prescription medicines for pain, fever, or discomfort as directed by your caregiver.  Rinse your mouth (gargle) often with salt water ( tsp salt in 8 oz [250 ml] of warm water) to relieve pain or swelling.  Do not drive after taking pain medicine (narcotics).  Do not apply heat to the outside of your face.  Return to your dentist for further treatment as directed. SEEK  MEDICAL CARE IF:  Your pain is not helped by medicine.  Your pain is getting worse instead of better. SEEK IMMEDIATE MEDICAL CARE IF:  You have a fever or persistent symptoms for more than 2-3 days.  You have a fever and your symptoms suddenly get worse.  You have chills or a very bad headache.  You have problems breathing or swallowing.  You have trouble opening your mouth.  You have swelling in the neck or around the eye. Document Released: 04/04/2005 Document Revised: 12/28/2011 Document Reviewed: 07/13/2010 [facility] Patient Information 2015 [place], [place]. This information is not intended to replace advice given to you by your health care provider. Make sure you discuss any questions you have with your health care provider.   Dental Caries  Dental caries (also called tooth decay) is the most common oral disease. It can occur at any age, but is more common in children and young adults.  HOW DENTAL CARIES DEVELOPS  The process of decay begins when bacteria and foods (particularly sugars and starches) combine in your mouth to produce plaque. Plaque is a substance that sticks to the hard, outer surface of a tooth (enamel). The bacteria in plaque produce acids that attack enamel. These acids may also attack the root surface of a tooth (cementum) if it is exposed. Repeated attacks dissolve these surfaces and create holes in the tooth (cavities). If left untreated, the acids destroy the other layers of the tooth.  RISK FACTORS  Frequent sipping of sugary beverages.   Frequent snacking on sugary and starchy  foods, especially those that easily get stuck in the teeth.   Poor oral hygiene.   Dry mouth.   Substance abuse such as methamphetamine abuse.   Broken or poor-fitting dental restorations.   Eating disorders.   Gastroesophageal reflux disease (GERD).   Certain radiation treatments to the head and neck. SYMPTOMS In the early stages of dental caries, symptoms are  seldom present. Sometimes white, chalky areas may be seen on the enamel or other tooth layers. In later stages, symptoms may include:  Pits and holes on the enamel.  Toothache after sweet, hot, or cold foods or drinks are consumed.  Pain around the tooth.  Swelling around the tooth. DIAGNOSIS  Most of the time, dental caries is detected during a regular dental checkup. A diagnosis is made after a thorough medical and dental history is taken and the surfaces of your teeth are checked for signs of dental caries. Sometimes special instruments, such as lasers, are used to check for dental caries. Dental X-ray exams may be taken so that areas not visible to the eye (such as between the contact areas of the teeth) can be checked for cavities.  TREATMENT  If dental caries is in its early stages, it may be reversed with a fluoride treatment or an application of a remineralizing agent at the dental office. Thorough brushing and flossing at home is needed to aid these treatments. If it is in its later stages, treatment depends on the location and extent of tooth destruction:   If a small area of the tooth has been destroyed, the destroyed area will be removed and cavities will be filled with a material such as gold, silver amalgam, or composite resin.   If a large area of the tooth has been destroyed, the destroyed area will be removed and a cap (crown) will be fitted over the remaining tooth structure.   If the center part of the tooth (pulp) is affected, a procedure called a root canal will be needed before a filling or crown can be placed.   If most of the tooth has been destroyed, the tooth may need to be pulled (extracted). HOME CARE INSTRUCTIONS You can prevent, stop, or reverse dental caries at home by practicing good oral hygiene. Good oral hygiene includes:  Thoroughly cleaning your teeth at least twice a day with a toothbrush and dental floss.   Using a fluoride toothpaste. A fluoride  mouth rinse may also be used if recommended by your dentist or health care provider.   Restricting the amount of sugary and starchy foods and sugary liquids you consume.   Avoiding frequent snacking on these foods and sipping of these liquids.   Keeping regular visits with a dentist for checkups and cleanings. PREVENTION   Practice good oral hygiene.  Consider a dental sealant. A dental sealant is a coating material that is applied by your dentist to the pits and grooves of teeth. The sealant prevents food from being trapped in them. It may protect the teeth for several years.  Ask about fluoride supplements if you live in a community without fluorinated water or with water that has a low fluoride content. Use fluoride supplements as directed by your dentist or health care provider.  Allow fluoride varnish applications to teeth if directed by your dentist or health care provider. Document Released: 12/25/2001 Document Revised: 12/05/2012 Document Reviewed: 04/06/2012 [employer] [facility] Patient Information 2015 [place], [place]. This information is not intended to replace advice given to you by your health care  provider. Make sure you discuss any questions you have with your health care provider.   Emergency Department Resource Guide 1) Find a Doctor and Pay Out of Pocket Although you won't have to find out who is covered by your insurance plan, it is a good idea to ask around and get recommendations. You will then need to call the office and see if the doctor you have chosen will accept you as a new patient and what types of options they offer for patients who are self-pay. Some doctors offer discounts or will set up payment plans for their patients who do not have insurance, but you will need to ask so you aren't surprised when you get to your appointment.  2) Contact Your Local Health Department Not all health departments have doctors that can see patients for sick visits, but many do, so it is  worth a call to see if yours does. If you don't know where your local health department is, you can check in your phone book. The CDC also has a tool to help you locate your state's health department, and many state websites also have listings of all of their local health departments.  3) Find a Walk-in Clinic If your illness is not likely to be very severe or complicated, you may want to try a walk in clinic. These are popping up all over the country in pharmacies, drugstores, and shopping centers. They're usually staffed by nurse practitioners or physician assistants that have been trained to treat common illnesses and complaints. They're usually fairly quick and inexpensive. However, if you have serious medical issues or chronic medical problems, these are probably not your best option.  No Primary Care Doctor: - Call Health Connect at  [phone] - they can help you locate a primary care doctor that  accepts your insurance, provides certain services, etc. - Physician Referral Service- [phone]  Chronic Pain Problems: Organization         Address  Phone   Notes  [facility]  [phone] Patients need to be referred by their primary care doctor.   Medication Assistance: Organization         Address  Phone   Notes  [facility] [address] [place], [place] [phone] --Must be a resident of [facility] -- Must have NO insurance coverage whatsoever (no Medicaid/ Medicare, etc.) -- The pt. MUST have a primary care doctor that directs their care regularly and follows them in the community   Med[facility]ist  [phone]   [employer]  [phone]    Agencies that provide inexpensive medical care: Organization         Address  Phone   Notes  [facility]  [phone]) [phone]   [facility]    [phone]   [facility] [address] [place],  [place] [id number] [phone]   [facility] [address], [place] [phone]   [facility]    [phone]   [facility]    [phone]   [facility]  [address], [place] Phone:  [phone]) [phone], Fax:  [phone] Hours of Operation:  9 am - 6 pm, M-F.  Also accepts Medicaid/Medicare and self-pay.  [facility]  [address], [place] Phone: [phone]) [phone], Fax: [phone]. Hours of Operation:  8:30 am - 5:30 pm, M-F.  Also accepts Medicaid and self-pay.  [facility] [place] [address], [place] Phone: [phone]   [facility] [address] Natasha Bence [place], [place] [phone], Ext. [phone] Mondays & Thursdays: 7-9 AM.  First 15 patients are seen on a first come, first serve basis.    Medicaid-accepting [facility] Providers:  Organization         Address  Phone   Notes  [facility] [address], [place] [phone] Also accepts self-pay patients.  [facility] [address] Laurell [place], [place]  [phone]   [facility] [address], [place] [phone]   [facility] [address], [place] [phone]   Renaye Rakers [address], [place]   [phone] Only accepts [place] patients after they have their name applied to their card.   Self-Pay (no insurance) in [facility]:  Organization         Address  Phone   Notes  Sickle Cell Patients, [facility] [address] [place], [place] [phone]   [facility] [address] [place], [place] [phone]   [facility] [place]  [address], [place] [phone]   [facility]/Dr. Osei-Bonsu  [address] [place] or [address], [place] [phone] Phone number for both [place] and [place] locations is the same.  [facility] [address] [place] [phone]   [facility] [address], [place] or [address] [phone]   [facility] [address] [place] [phone], phone; [phone], fax Sees patients 1st and 3rd Saturday of every month.  Must not qualify for public or private insurance (i.e. Medicaid, Medicare, [place] Health Choice, Veterans' Benefits)  Household income should be no more than 200% of the poverty level The clinic cannot treat you if you are pregnant or think you are pregnant  Sexually transmitted diseases are not treated at the clinic.    Dental Care: Organization         Address  Phone  Notes  [facility] Department of [facility] [address] [place], [place] [phone] Accepts children up to age 36 who are enrolled in [place] or [place] Health Choice; pregnant women with a Medicaid card; and children who have applied for Medicaid or [place] Health Choice, but were declined, whose parents can pay a reduced fee at time of service.  [facility] Department of [facility]  [address], [place] [phone] Accepts children up to age 72 who are enrolled in [place] or [place] Health Choice; pregnant women with a Medicaid card; and children who have applied for Medicaid or [place] Health Choice, but were declined, whose parents can pay a reduced fee at time of service.  [facility]  [address] [place], [place] [phone] Patients are seen by appointment only. Walk-ins are not accepted. [facility] will see patients 52 years of age and older. Monday - Tuesday (8am-5pm) Most Wednesdays (8:30-5pm) $30 per visit, cash only  [facility]  [address],  [facility] [phone]  Patients are seen by appointment  only. Walk-ins are not accepted. [facility] will see patients 44 years of age and older. One Wednesday Evening (Monthly: Volunteer Based).  $30 per visit, cash only  [employer]  [phone] for adults; Children under age 43, call Graduate Pediatric Dentistry at [phone]. Children aged 29-14, please call [phone] to request a pediatric application.  Dental services are provided in all areas of dental care including fillings, crowns and bridges, complete and partial dentures, implants, gum treatment, root canals, and extractions. Preventive care is also provided. Treatment is provided to both adults and children. Patients are selected via a lottery and there is often a waiting list.   [facility] [address], [place]  [phone] [email or web address]   [facility] [address] [place], [place] [phone], Ext. [phone] Second and Fourth Thursday of each month, opens at 6:30 AM; Clinic ends at 9 AM.  Patients are seen on a first-come first-served basis, and a limited number are seen during each clinic.   [facility]  [address] Ether Griffins [place], [place] [phone]   Eligibility Requirements You must have lived in [place], [place], or [place] counties for at least the last three months.   You cannot be eligible for state or federal sponsored [place], including [employer], [place], or [employer].   You generally cannot be eligible for healthcare insurance through your employer.    How to apply: Eligibility screenings are held every Tuesday and Wednesday afternoon from 1:00 pm until 4:00 pm. You do not need an appointment for the interview!  [facility] [address] [place], [place] [phone]   [facility]  [phone]   [facility] Health Department  [phone]   [facility]   [phone]    Behavioral Health Resources in the Community: Intensive Outpatient Programs Organization         Address  Phone  Notes  [facility] Services [address] [place], [place] [phone]   [facility] [address], [place], [place] [phone]   [facility] [address], [place], [place]  [phone]   [facility] [address],  [place], [place] [phone] or [phone]   Substance Abuse Resources Organization         Address  Phone  Notes  [facility] Drug Services  [phone]   [facility]  [phone]   [facility] [place]  [phone]   Floydene Flock  [phone]   Residential & Outpatient Substance Abuse Program  [phone]   Psychological Services Organization         Address  Phone  Notes  [facility]  [phone]   [facility] Services  [phone]   [facility] [address] [place] [phone] or [phone]    Mobile Crisis Teams Organization         Address  Phone  Notes  [facility], Mobile Crisis Care Unit  [phone]   [facility]  [address] [place], [place] [phone]   Doristine Locks [address] [place] [phone]    Self-Help/Support Groups Organization         Address  Phone             Notes  [facility] [place] - variety of support groups  [phone]- I7437963 Call for more information  Narcotics  Anonymous (NA), [facility] [address], [employer] [place]  2 meetings at this location   Residential Sports administrator         Address  Phone  Notes  [facility] [address],    [place]  [phone]   [facility]  [address] [place] [address], [place], [place] [phone]   [facility] [address] [place], [place] [phone] Admissions: 8am-3pm M-F   [facility] [address],    [place], [place] [phone]   [facility] [address] [place], [place], [place] [phone]   [facility] [address],  [place], [place] [phone]   [facility] - Intensive Outpatient [address] Laurell [address], [place], [place] [phone]   [facility] ([facility] [address] [place].,  [place], [place] [phone] or [phone]   [facility] ([facility]) [address], [place], [place] [phone] Accepts Medicaid  [facility] [address]  [place] [phone] Substance Abuse/Addiction Treatment   [facility] Organization         Address  Phone  Notes  [facility]  [phone]   Angie Fava, PhD [address] Ervin Knack [place], [place]   [phone] or [phone]   [facility]   [address] [place], [place] [phone]   [facility] [address], [place], [place] [phone] Insurance/Medicaid/sponsorship through [facility] and Families [address]                                    [place], [place] [phone] Therapy/tele-psych/case  [facility] [address] [place], [place] [phone]    Dr. Lolly Mustache  [phone]   [facility] [place]  [employer] [facility] Dept. 1) [address], [place] 2) [address], [place] 3)  [address], [place] [phone]  [phone]   [facility] Child Abuse Hotline (986)099-2117 or 712-151-7961 (After Hours)

## 2013-10-25 NOTE — ED Notes (Signed)
Pt reports dental pain to upper left side since yesterday associated with swelling. Pt reports hx of dental carries. No fevers. Rates pain 7/10.

## 2013-10-26 NOTE — ED Provider Notes (Signed)
CSN: [id number]     Arrival date & time 10/25/13  1118 History   First MD Initiated Contact with Patient 10/25/13 1517     Chief Complaint  Patient presents with  . Dental Pain   HPI  Judy Graham is a 23 y.o. female with a PMH of scoliosis who presents to the ED for evaluation of dental pain. History was provided by the patient. Patient complains of left upper dental pain which began two days ago. Denies any injuries or trauma. Patient complains of a constant throbbing pain. Patient seen in the ED yesterday and was prescribed Norco, Penicillin and Ibuprofen. Patient has been taking the antibiotic with worsening facial swelling. She ran out of the Norco and has continued to take the Ibuprofen. Patient does not have a dentist. Denies any fever, chills, neck pain, difficulty swallowing/breathing. Patient is a smoker.    Past Medical History  Diagnosis Date  . Scoliosis    Past Surgical History  Procedure Laterality Date  . Back surgery    . Knee surgery     No family history on file. History  Substance Use Topics  . Smoking status: Current Every Day Smoker -- 0.50 packs/day  . Smokeless tobacco: Not on file  . Alcohol Use: Not on file     Comment: occ   OB History   Grav Para Term Preterm Abortions TAB SAB Ect Mult Living                  Review of Systems  Constitutional: Negative for fever, diaphoresis, activity change, appetite change and fatigue.  HENT: Positive for dental problem and facial swelling.   Gastrointestinal: Negative for nausea, vomiting and abdominal pain.  Musculoskeletal: Negative for neck pain.  Skin: Negative for wound.  Neurological: Negative for dizziness, weakness, light-headedness and headaches.    Allergies  Clindamycin/lincomycin  Home Medications   Prior to Admission medications   Medication Sig Start Date End Date Taking? Authorizing Provider  amoxicillin-clavulanate (AUGMENTIN) 875-125 MG per tablet Take 1 tablet by mouth every 12  (twelve) hours. 10/25/13   Jillyn Ledger, PA-C  HYDROcodone-acetaminophen (NORCO/VICODIN) 5-325 MG per tablet Take 1-2 tablets by mouth every 4 (four) hours as needed for moderate pain or severe pain. 10/24/13   Jillyn Ledger, PA-C  ibuprofen (ADVIL,MOTRIN) 200 MG tablet Take 1,200 mg by mouth every 6 (six) hours as needed for moderate pain. FOR PAIN    Historical Provider, MD  ibuprofen (ADVIL,MOTRIN) 800 MG tablet Take 1 tablet (800 mg total) by mouth 3 (three) times daily. 10/24/13   Jillyn Ledger, PA-C  oxyCODONE-acetaminophen (PERCOCET/ROXICET) 5-325 MG per tablet Take 1-2 tablets by mouth every 4 (four) hours as needed for severe pain. 10/25/13   Jillyn Ledger, PA-C  penicillin v potassium (VEETID) 500 MG tablet Take 1 tablet (500 mg total) by mouth 4 (four) times daily. 10/24/13   Jillyn Ledger, PA-C   BP 113/75  Pulse 95  Temp(Src) 98.7 F (37.1 C) (Oral)  Resp 18  SpO2 100%  Filed Vitals:   10/25/13 1229 10/25/13 1526 10/25/13 1545 10/25/13 1600  BP: 124/74 110/80 106/71 113/75  Pulse: 105 100 83 95  Temp: 98.6 F (37 C) 98.8 F (37.1 C)  98.7 F (37.1 C)  TempSrc:  Oral  Oral  Resp: 15 19  18   SpO2: 100% 100% 100% 100%    Physical Exam  Nursing note and vitals reviewed. Constitutional: She is oriented to person, place, and time. She  appears well-developed and well-nourished. No distress.  Non-toxic  HENT:  Head: Normocephalic and atraumatic.    Right Ear: External ear normal.  Left Ear: External ear normal.  Nose: Nose normal.  Mouth/Throat: Oropharynx is clear and moist. No oropharyngeal exudate.    Dental cary to the left upper molar. Facial edema to the left upper cheek. Dental abscess to the left upper jaw surrounding dental cary. No drainage. No oral edema. No erythema to the posterior pharynx. Tonsils without edema or exudates. Uvula midline. No trismus. No difficulty controlling secretions. Tympanic membranes gray and translucent bilaterally with no  erythema, edema, or hemotympanum.  No mastoid or tragal tenderness bilaterally.   Eyes: Conjunctivae and EOM are normal. Pupils are equal, round, and reactive to light. Right eye exhibits no discharge. Left eye exhibits no discharge.  Neck: Normal range of motion. Neck supple.  No cervical lymphadenopathy. No nuchal rigidity. No submental fullness.   Cardiovascular: Normal rate.   Pulmonary/Chest: Effort normal.  Abdominal: Soft.  Musculoskeletal: Normal range of motion. She exhibits no edema and no tenderness.  Neurological: She is alert and oriented to person, place, and time.  Skin: Skin is warm and dry. She is not diaphoretic.    ED Course  Dental Date/Time: 10/25/2013 4:00 PM Performed by: Jillyn LedgerPALMER, Jaylia Pettus K Authorized by: Jillyn LedgerPALMER, Solace Manwarren K Consent: Verbal consent obtained. Consent given by: patient Patient identity confirmed: verbally with patient Local anesthesia used: yes Local anesthetic: bupivacaine 0.5% with epinephrine Anesthetic total: 1.8 ml Patient sedated: no Patient tolerance: Patient tolerated the procedure well with no immediate complications. Comments: Dental abscess I&D'd with an 11 blade scalpel. Bloody and scant purulent drainage. Bleeding controlled. Patient tolerated the procedure well.    (including critical care time) Labs Review Labs Reviewed - No data to display  Imaging Review No results found.   EKG Interpretation None      MDM   Judy Graham is a 23 y.o. female with a PMH of scoliosis who presents to the ED for evaluation of dental pain. Patient found to have a dental abscess. Abscess drained in the ED. Patient given Augmentin (allergy to clindamycin). No concerning signs/symptoms for Ludwig's Angina at this time.  Patient afebrile and non-toxic in appearance. Patient instructed to follow-up with a dentist for further evaluation and management. More resources provided. Return precautions were given. Encouraged to continue Ibuprofen. Patient in  agreement with discharge and plan.     Discharge Medication List as of 10/25/2013  4:08 PM    START taking these medications   Details  amoxicillin-clavulanate (AUGMENTIN) 875-125 MG per tablet Take 1 tablet by mouth every 12 (twelve) hours., Starting 10/25/2013, Until Discontinued, Print    oxyCODONE-acetaminophen (PERCOCET/ROXICET) 5-325 MG per tablet Take 1-2 tablets by mouth every 4 (four) hours as needed for severe pain., Starting 10/25/2013, Until Discontinued, Print         Final impressions: 1. Dental abscess   2. Dental caries      Luiz IronJessica Katlin Davette Nugent PA-C          Jillyn LedgerJessica K Meegan Shanafelt, PA-C 10/26/13 1556

## 2013-10-26 NOTE — ED Provider Notes (Signed)
Medical screening examination/treatment/procedure(s) were performed by non-physician practitioner and as supervising physician I was immediately available for consultation/collaboration.     Geoffery Lyonsouglas Warden Buffa, MD 10/26/13 [phone]

## 2014-01-09 ENCOUNTER — Encounter (HOSPITAL_COMMUNITY): Payer: Self-pay | Admitting: Emergency Medicine

## 2014-01-09 ENCOUNTER — Emergency Department (HOSPITAL_COMMUNITY)
Admission: EM | Admit: 2014-01-09 | Discharge: 2014-01-09 | Discharge status: Against Medical Advice | Payer: Self-pay | Attending: Emergency Medicine | Admitting: Emergency Medicine

## 2014-01-09 DIAGNOSIS — M549 Dorsalgia, unspecified: Secondary | ICD-10-CM | POA: Insufficient documentation

## 2014-01-09 DIAGNOSIS — R109 Unspecified abdominal pain: Secondary | ICD-10-CM | POA: Insufficient documentation

## 2014-01-09 DIAGNOSIS — F17200 Nicotine dependence, unspecified, uncomplicated: Secondary | ICD-10-CM | POA: Insufficient documentation

## 2014-01-09 DIAGNOSIS — Z3201 Encounter for pregnancy test, result positive: Secondary | ICD-10-CM | POA: Insufficient documentation

## 2014-01-09 LAB — URINALYSIS, ROUTINE W REFLEX MICROSCOPIC
Bilirubin Urine: NEGATIVE
GLUCOSE, UA: NEGATIVE mg/dL
Hgb urine dipstick: NEGATIVE
Ketones, ur: 40 mg/dL — AB
LEUKOCYTES UA: NEGATIVE
Nitrite: NEGATIVE
PH: 8.5 — AB (ref 5.0–8.0)
PROTEIN: NEGATIVE mg/dL
Specific Gravity, Urine: 1.018 (ref 1.005–1.030)
Urobilinogen, UA: 0.2 mg/dL (ref 0.0–1.0)

## 2014-01-09 LAB — POC URINE PREG, ED: Preg Test, Ur: POSITIVE — AB

## 2014-01-09 NOTE — ED Notes (Signed)
Pt c/o right sided back and flank pain x 3 days

## 2014-01-16 ENCOUNTER — Emergency Department (HOSPITAL_COMMUNITY)
Admission: EM | Admit: 2014-01-16 | Discharge: 2014-01-16 | Disposition: A | Discharge status: Home / Self Care | Payer: Self-pay | Attending: Emergency Medicine | Admitting: Emergency Medicine

## 2014-01-16 ENCOUNTER — Emergency Department (HOSPITAL_COMMUNITY): Payer: Self-pay

## 2014-01-16 ENCOUNTER — Encounter (HOSPITAL_COMMUNITY): Payer: Self-pay | Admitting: Emergency Medicine

## 2014-01-16 DIAGNOSIS — O99331 Smoking (tobacco) complicating pregnancy, first trimester: Secondary | ICD-10-CM | POA: Insufficient documentation

## 2014-01-16 DIAGNOSIS — R102 Pelvic and perineal pain: Secondary | ICD-10-CM | POA: Insufficient documentation

## 2014-01-16 DIAGNOSIS — Z3A09 9 weeks gestation of pregnancy: Secondary | ICD-10-CM | POA: Insufficient documentation

## 2014-01-16 DIAGNOSIS — O26891 Other specified pregnancy related conditions, first trimester: Secondary | ICD-10-CM | POA: Insufficient documentation

## 2014-01-16 DIAGNOSIS — N898 Other specified noninflammatory disorders of vagina: Secondary | ICD-10-CM | POA: Insufficient documentation

## 2014-01-16 DIAGNOSIS — F17210 Nicotine dependence, cigarettes, uncomplicated: Secondary | ICD-10-CM | POA: Insufficient documentation

## 2014-01-16 LAB — URINALYSIS, ROUTINE W REFLEX MICROSCOPIC
Bilirubin Urine: NEGATIVE
Glucose, UA: NEGATIVE mg/dL
Hgb urine dipstick: NEGATIVE
KETONES UR: NEGATIVE mg/dL
LEUKOCYTES UA: NEGATIVE
NITRITE: NEGATIVE
PH: 7.5 (ref 5.0–8.0)
PROTEIN: NEGATIVE mg/dL
Specific Gravity, Urine: 1.005 (ref 1.005–1.030)
UROBILINOGEN UA: 0.2 mg/dL (ref 0.0–1.0)

## 2014-01-16 LAB — COMPREHENSIVE METABOLIC PANEL
ALK PHOS: 62 U/L (ref 39–117)
ALT: 11 U/L (ref 0–35)
AST: 20 U/L (ref 0–37)
Albumin: 4 g/dL (ref 3.5–5.2)
Anion gap: 12 (ref 5–15)
BILIRUBIN TOTAL: 0.3 mg/dL (ref 0.3–1.2)
BUN: 5 mg/dL — ABNORMAL LOW (ref 6–23)
CHLORIDE: 104 meq/L (ref 96–112)
CO2: 24 meq/L (ref 19–32)
Calcium: 9.3 mg/dL (ref 8.4–10.5)
Creatinine, Ser: 0.47 mg/dL — ABNORMAL LOW (ref 0.50–1.10)
GFR calc Af Amer: >90 mL/min (ref >90)
Glucose, Bld: 94 mg/dL (ref 70–99)
Potassium: 3.9 mEq/L (ref 3.7–5.3)
Sodium: 140 mEq/L (ref 137–147)
Total Protein: 7.9 g/dL (ref 6.0–8.3)

## 2014-01-16 LAB — WET PREP, GENITAL
CLUE CELLS WET PREP: NONE SEEN
TRICH WET PREP: NONE SEEN
WBC, Wet Prep HPF POC: NONE SEEN
YEAST WET PREP: NONE SEEN

## 2014-01-16 LAB — HCG, QUANTITATIVE, PREGNANCY: hCG, Beta Chain, Quant, S: 65571 m[IU]/mL — ABNORMAL HIGH (ref <5)

## 2014-01-16 LAB — CBC WITH DIFFERENTIAL/PLATELET
BASOS ABS: 0 10*3/uL (ref 0.0–0.1)
Basophils Relative: 0 % (ref 0–1)
Eosinophils Absolute: 0.1 10*3/uL (ref 0.0–0.7)
Eosinophils Relative: 2 % (ref 0–5)
HEMATOCRIT: 36.9 % (ref 36.0–46.0)
Hemoglobin: 12.3 g/dL (ref 12.0–15.0)
LYMPHS PCT: 23 % (ref 12–46)
Lymphs Abs: 1.5 10*3/uL (ref 0.7–4.0)
MCH: 30.2 pg (ref 26.0–34.0)
MCHC: 33.3 g/dL (ref 30.0–36.0)
MCV: 90.7 fL (ref 78.0–100.0)
MONO ABS: 0.4 10*3/uL (ref 0.1–1.0)
Monocytes Relative: 6 % (ref 3–12)
NEUTROS ABS: 4.7 10*3/uL (ref 1.7–7.7)
Neutrophils Relative %: 69 % (ref 43–77)
Platelets: 258 10*3/uL (ref 150–400)
RBC: 4.07 MIL/uL (ref 3.87–5.11)
RDW: 13.2 % (ref 11.5–15.5)
WBC: 6.8 10*3/uL (ref 4.0–10.5)

## 2014-01-16 LAB — ABO/RH: ABO/RH(D): O POS

## 2014-01-16 LAB — PREGNANCY, URINE: Preg Test, Ur: POSITIVE — AB

## 2014-01-16 LAB — LIPASE, BLOOD: Lipase: 27 U/L (ref 11–59)

## 2014-01-16 LAB — OB RESULTS CONSOLE GC/CHLAMYDIA
Chlamydia: NEGATIVE
GC PROBE AMP, GENITAL: NEGATIVE

## 2014-01-16 MED ORDER — HYDROCODONE-ACETAMINOPHEN 5-325 MG PO TABS
2.0000 | ORAL_TABLET | Freq: Once | ORAL | Status: AC
  Administered 2014-01-16: 2 via ORAL
  Filled 2014-01-16: qty 2

## 2014-01-16 NOTE — ED Notes (Signed)
Pt in stating she is pregnant, her LMP was two months ago, c/o right lower quad pain that started a few days ago, pt states she has not had an ultrasound to confirm pregnancy. Pt also reports vaginal discharge.

## 2014-01-16 NOTE — ED Notes (Signed)
Pt. Reports that she did a home pregnancy X 3 and all were positive.  Pt. Reports having  RLQ abdominal pain radiates into rt. Flank.  Pain began 2-3 weeks ago.  Denies any vaginal bleeding.  Pt. Having pressure when she voids and urgency.   Pt. Also reports having nausea

## 2014-01-16 NOTE — ED Provider Notes (Signed)
CSN: [id number]     Arrival date & time 01/16/14  1128 History   First MD Initiated Contact with Patient 01/16/14 1147     Chief Complaint  Patient presents with  . Pregnancy   . Abdominal Pain     (Consider location/radiation/quality/duration/timing/severity/associated sxs/prior Treatment) HPI Judy Graham is a 23 year old female G3 P0020 with past medical history scoliosis who presents to the ER with abdominal pain. Patient with positive home pregnancy test  approximately 2 weeks ago and LMP 2 months ago. Patient states she has noticed this pain for the past "several days" and cannot give a specific time line. Patient states she came to the ER to be seen 2 days ago, however stated wait was too long and went home. Patient describes her pain as a constant, throbbing pain in her right lower quadrant radiating to her right flank. Patient states he has had kidney stones in the past, and she states the pain feels somewhat similar to her to stones. Patient denies any associated nausea, vomiting, diarrhea, fever, dysuria, vaginal bleeding. Patient does report mild vaginal discharge which is white in color. Patient states she is sexually active, with one partner. Past Medical History  Diagnosis Date  . Scoliosis    Past Surgical History  Procedure Laterality Date  . Back surgery    . Knee surgery     History reviewed. No pertinent family history. History  Substance Use Topics  . Smoking status: Current Every Day Smoker -- 0.50 packs/day  . Smokeless tobacco: Not on file  . Alcohol Use: Not on file     Comment: occ   OB History   Grav Para Term Preterm Abortions TAB SAB Ect Mult Living                 Review of Systems  Constitutional: Negative for fever.  HENT: Negative for trouble swallowing.   Eyes: Negative for visual disturbance.  Respiratory: Negative for shortness of breath.   Cardiovascular: Negative for chest pain.  Gastrointestinal: Positive for abdominal pain. Negative for  nausea and vomiting.  Genitourinary: Positive for vaginal discharge and pelvic pain. Negative for dysuria, vaginal bleeding and vaginal pain.  Musculoskeletal: Negative for neck pain.  Skin: Negative for rash.  Neurological: Negative for dizziness, weakness and numbness.  Psychiatric/Behavioral: Negative.       Allergies  Clindamycin/lincomycin  Home Medications   Prior to Admission medications   Not on File   BP 103/57  Pulse 65  Temp(Src) 98.2 F (36.8 C) (Oral)  Resp 21  SpO2 100%  LMP 11/16/2013 Physical Exam  Nursing note and vitals reviewed. Constitutional: She is oriented to person, place, and time. She appears well-developed and well-nourished. No distress.  HENT:  Head: Normocephalic and atraumatic.  Mouth/Throat: Oropharynx is clear and moist. No oropharyngeal exudate.  Eyes: Right eye exhibits no discharge. Left eye exhibits no discharge. No scleral icterus.  Neck: Normal range of motion.  Cardiovascular: Normal rate, regular rhythm and normal heart sounds.   No murmur heard. Pulmonary/Chest: Effort normal and breath sounds normal. No respiratory distress.  Abdominal: Soft. Normal appearance and bowel sounds are normal. There is tenderness in the right lower quadrant. There is no rigidity, no rebound, no guarding, no tenderness at McBurney's point and negative Murphy's sign.    Genitourinary: There is no rash, tenderness, lesion or injury on the right labia. There is no rash, tenderness, lesion or injury on the left labia. Uterus is deviated. Uterus is not tender. Cervix exhibits no  motion tenderness, no discharge and no friability. Right adnexum displays tenderness. Right adnexum displays no mass and no fullness. Left adnexum displays no mass, no tenderness and no fullness. No erythema, tenderness or bleeding around the vagina. No signs of injury around the vagina. Vaginal discharge found.  Moderate amount of right adnexal tenderness noted. No cervical motion  tenderness. Mild discharge noted in vaginal vault white in color. Chaperone present during entire pelvic exam.  Musculoskeletal: Normal range of motion. She exhibits no edema and no tenderness.  Neurological: She is alert and oriented to person, place, and time. No cranial nerve deficit. Coordination normal.  Skin: Skin is warm and dry. No rash noted. She is not diaphoretic.  Psychiatric: She has a normal mood and affect.    ED Course  Procedures (including critical care time) Labs Review Labs Reviewed  URINALYSIS, ROUTINE W REFLEX MICROSCOPIC - Abnormal; Notable for the following:    APPearance HAZY (*)    All other components within normal limits  PREGNANCY, URINE - Abnormal; Notable for the following:    Preg Test, Ur POSITIVE (*)    All other components within normal limits  COMPREHENSIVE METABOLIC PANEL - Abnormal; Notable for the following:    BUN 5 (*)    Creatinine, Ser 0.47 (*)    All other components within normal limits  HCG, QUANTITATIVE, PREGNANCY - Abnormal; Notable for the following:    hCG, Beta Chain, Quant, S 65571 (*)    All other components within normal limits  WET PREP, GENITAL  GC/CHLAMYDIA PROBE AMP  CBC WITH DIFFERENTIAL  LIPASE, BLOOD  ABO/RH    Imaging Review US Ob Comp Less 14 Wks  01/16/2014   CLINICAL DATA:  Acute right lower quadrant abdominal and pelvic pain radiating to the right flank. Pain began 2-3 weeks ago. No associated vaginal bleeding.  EXAM: OBSTETRIC <14 WK [place] AND TRANSVAGINAL OB  US DOPPLER ULTRASOUND OF OVARIES  TECHNIQUE: Both transabdominal and transvaginal ultrasound examinations were performed for complete evaluation of the gestation as well as the maternal uterus, adnexal regions, and pelvic cul-de-sac. Transvaginal technique was performed to assess early pregnancy.  Color and duplex Doppler ultrasound was utilized to evaluate blood flow to the ovaries.  COMPARISON:  None.  FINDINGS: Intrauterine gestational sac: Visualized/normal in  shape.  Yolk sac:  Visualized  Embryo:  Visualized  Cardiac Activity: Detected  Heart Rate: 175 bpm  CRL:   24.7  Mm   9 w 1 d                  [place] EDC: 08/20/2014  Maternal uterus/adnexae: Single viable intrauterine pregnancy estimated at 9 weeks 2 days gestational age by crown-rump length as above. Cardiac activity demonstrated with a heart rate of 175 beats per min. No subchorionic hemorrhage. No acute finding by ultrasound. Trace pelvic free fluid. Right adnexal small cyst measures 14 mm.  Pulsed Doppler evaluation of both ovaries demonstrates normal appearing low-resistance arterial and venous waveforms.  IMPRESSION: Single viable IUP with an estimated gestational age [redacted] weeks 1 day by crown-rump length as above. No acute finding by ultrasound. No evidence of torsion.   Electronically Signed   By: Ruel Favors M.D.   On: 01/16/2014 14:42   US Ob Transvaginal  01/16/2014   CLINICAL DATA:  Acute right lower quadrant abdominal and pelvic pain radiating to the right flank. Pain began 2-3 weeks ago. No associated vaginal bleeding.  EXAM: OBSTETRIC <14 WK [place] AND TRANSVAGINAL OB  US DOPPLER  ULTRASOUND OF OVARIES  TECHNIQUE: Both transabdominal and transvaginal ultrasound examinations were performed for complete evaluation of the gestation as well as the maternal uterus, adnexal regions, and pelvic cul-de-sac. Transvaginal technique was performed to assess early pregnancy.  Color and duplex Doppler ultrasound was utilized to evaluate blood flow to the ovaries.  COMPARISON:  None.  FINDINGS: Intrauterine gestational sac: Visualized/normal in shape.  Yolk sac:  Visualized  Embryo:  Visualized  Cardiac Activity: Detected  Heart Rate: 175 bpm  CRL:   24.7  Mm   9 w 1 d                  [place] EDC: 08/20/2014  Maternal uterus/adnexae: Single viable intrauterine pregnancy estimated at 9 weeks 2 days gestational age by crown-rump length as above. Cardiac activity demonstrated with a heart rate of 175 beats per min. No  subchorionic hemorrhage. No acute finding by ultrasound. Trace pelvic free fluid. Right adnexal small cyst measures 14 mm.  Pulsed Doppler evaluation of both ovaries demonstrates normal appearing low-resistance arterial and venous waveforms.  IMPRESSION: Single viable IUP with an estimated gestational age [redacted] weeks 1 day by crown-rump length as above. No acute finding by ultrasound. No evidence of torsion.   Electronically Signed   By: Ruel Favors M.D.   On: 01/16/2014 14:42   [place] Art/ven Flow Abd Pelv Doppler  01/16/2014   CLINICAL DATA:  Acute right lower quadrant abdominal and pelvic pain radiating to the right flank. Pain began 2-3 weeks ago. No associated vaginal bleeding.  EXAM: OBSTETRIC <14 WK [place] AND TRANSVAGINAL OB  US DOPPLER ULTRASOUND OF OVARIES  TECHNIQUE: Both transabdominal and transvaginal ultrasound examinations were performed for complete evaluation of the gestation as well as the maternal uterus, adnexal regions, and pelvic cul-de-sac. Transvaginal technique was performed to assess early pregnancy.  Color and duplex Doppler ultrasound was utilized to evaluate blood flow to the ovaries.  COMPARISON:  None.  FINDINGS: Intrauterine gestational sac: Visualized/normal in shape.  Yolk sac:  Visualized  Embryo:  Visualized  Cardiac Activity: Detected  Heart Rate: 175 bpm  CRL:   24.7  Mm   9 w 1 d                  [place] EDC: 08/20/2014  Maternal uterus/adnexae: Single viable intrauterine pregnancy estimated at 9 weeks 2 days gestational age by crown-rump length as above. Cardiac activity demonstrated with a heart rate of 175 beats per min. No subchorionic hemorrhage. No acute finding by ultrasound. Trace pelvic free fluid. Right adnexal small cyst measures 14 mm.  Pulsed Doppler evaluation of both ovaries demonstrates normal appearing low-resistance arterial and venous waveforms.  IMPRESSION: Single viable IUP with an estimated gestational age [redacted] weeks 1 day by crown-rump length as above. No acute finding  by ultrasound. No evidence of torsion.   Electronically Signed   By: Ruel Favors M.D.   On: 01/16/2014 14:42     EKG Interpretation None      MDM   Final diagnoses:  Pelvic pain in female    Patient several days of right lower quadrant abdominal pain recently with positive home pregnancy test. Rule out for ectopic pregnancy, ovarian torsion, symptomatic relief. Urine pregnancy positive today. Will followup with pelvic ultrasound, CBC, CMP, ABO Rh, hCG beta Quant.  Labs unremarkable for any acute pathology, no leukocytosis, no anemia, no UTI, wet prep unremarkable.  2:42 PM: US pelvis complete with impression: Single viable IUP with an estimated gestational age  [redacted] weeks 1 day by crown-rump length as above. No acute finding by ultrasound. No evidence of torsion.  Beta Quant at 65,000, consistent with dated above pelvic ultrasound confirmed IUP. We will discharge patient at this time and have her followup with OB/GYN. Patient states she has an appointment with the health Department for her prenatal care in November. I encouraged patient to call this evening to see her sooner for her pelvic pain. I also gave patient a number for women's clinic to followup with them if she cannot get a sooner appointment with the health department. I encouraged patient to drink plenty of fluids, and use Tylenol one to 2 pills every 4-6 hours as needed for her pelvic pain. I gave patient strict return precautions, and encourage her to call or return to the ER should her symptoms change, worsen, return, persist or should she have a questions or concerns. Patient was agreeable to this plan.  BP 103/57  Pulse 65  Temp(Src) 98.2 F (36.8 C) (Oral)  Resp 21  SpO2 100%  LMP 11/16/2013   Signed,  Ladona Mow, PA-C 5:32 PM  This patient discussed with Dr. Gerhard Munch, M.D.      Monte Fantasia, PA-C 01/16/14 1732

## 2014-01-16 NOTE — Discharge Instructions (Signed)
Use Tylenol over-the-counter as needed every 4-6 hours for pelvic pain. Return to the ER if your symptoms worsen, persist, change or if you develop fever, nausea, vomiting, vaginal discharge, vaginal bleeding.    Pelvic Pain Female pelvic pain can be caused by many different things and start from a variety of places. Pelvic pain refers to pain that is located in the lower half of the abdomen and between your hips. The pain may occur over a short period of time (acute) or may be reoccurring (chronic). The cause of pelvic pain may be related to disorders affecting the female reproductive organs (gynecologic), but it may also be related to the bladder, kidney stones, an intestinal complication, or muscle or skeletal problems. Getting help right away for pelvic pain is important, especially if there has been severe, sharp, or a sudden onset of unusual pain. It is also important to get help right away because some types of pelvic pain can be life threatening.  CAUSES  Below are only some of the causes of pelvic pain. The causes of pelvic pain can be in one of several categories.   Gynecologic.  Pelvic inflammatory disease.  Sexually transmitted infection.  Ovarian cyst or a twisted ovarian ligament (ovarian torsion).  Uterine lining that grows outside the uterus (endometriosis).  Fibroids, cysts, or tumors.  Ovulation.  Pregnancy.  Pregnancy that occurs outside the uterus (ectopic pregnancy).  Miscarriage.  Labor.  Abruption of the placenta or ruptured uterus.  Infection.  Uterine infection (endometritis).  Bladder infection.  Diverticulitis.  Miscarriage related to a uterine infection (septic abortion).  Bladder.  Inflammation of the bladder (cystitis).  Kidney stone(s).  Gastrointestinal.  Constipation.  Diverticulitis.  Neurologic.  Trauma.  Feeling pelvic pain because of mental or emotional causes (psychosomatic).  Cancers of the bowel or pelvis. EVALUATION   Your caregiver will want to take a careful history of your concerns. This includes recent changes in your health, a careful gynecologic history of your periods (menses), and a sexual history. Obtaining your family history and medical history is also important. Your caregiver may suggest a pelvic exam. A pelvic exam will help identify the location and severity of the pain. It also helps in the evaluation of which organ system may be involved. In order to identify the cause of the pelvic pain and be properly treated, your caregiver may order tests. These tests may include:   A pregnancy test.  Pelvic ultrasonography.  An X-ray exam of the abdomen.  A urinalysis or evaluation of vaginal discharge.  Blood tests. HOME CARE INSTRUCTIONS   Only take over-the-counter or prescription medicines for pain, discomfort, or fever as directed by your caregiver.   Rest as directed by your caregiver.   Eat a balanced diet.   Drink enough fluids to make your urine clear or pale yellow, or as directed.   Avoid sexual intercourse if it causes pain.   Apply warm or cold compresses to the lower abdomen depending on which one helps the pain.   Avoid stressful situations.   Keep a journal of your pelvic pain. Write down when it started, where the pain is located, and if there are things that seem to be associated with the pain, such as food or your menstrual cycle.  Follow up with your caregiver as directed.  SEEK MEDICAL CARE IF:  Your medicine does not help your pain.  You have abnormal vaginal discharge. SEEK IMMEDIATE MEDICAL CARE IF:   You have heavy bleeding from the vagina.  Your pelvic pain increases.   You feel light-headed or faint.   You have chills.   You have pain with urination or blood in your urine.   You have uncontrolled diarrhea or vomiting.   You have a fever or persistent symptoms for more than 3 days.  You have a fever and your symptoms suddenly get  worse.   You are being physically or sexually abused.  MAKE SURE YOU:  Understand these instructions.  Will watch your condition.  Will get help if you are not doing well or get worse. Document Released: 03/01/2004 Document Revised: 08/19/2013 Document Reviewed: 07/25/2011 [facility] Patient Information 2015 [place], [place]. This information is not intended to replace advice given to you by your health care provider. Make sure you discuss any questions you have with your health care provider.   Emergency Department Resource Guide 1) Find a Doctor and Pay Out of Pocket Although you won't have to find out who is covered by your insurance plan, it is a good idea to ask around and get recommendations. You will then need to call the office and see if the doctor you have chosen will accept you as a new patient and what types of options they offer for patients who are self-pay. Some doctors offer discounts or will set up payment plans for their patients who do not have insurance, but you will need to ask so you aren't surprised when you get to your appointment.  2) Contact Your Local Health Department Not all health departments have doctors that can see patients for sick visits, but many do, so it is worth a call to see if yours does. If you don't know where your local health department is, you can check in your phone book. The CDC also has a tool to help you locate your state's health department, and many state websites also have listings of all of their local health departments.  3) Find a Walk-in Clinic If your illness is not likely to be very severe or complicated, you may want to try a walk in clinic. These are popping up all over the country in pharmacies, drugstores, and shopping centers. They're usually staffed by nurse practitioners or physician assistants that have been trained to treat common illnesses and complaints. They're usually fairly quick and inexpensive. However, if you have  serious medical issues or chronic medical problems, these are probably not your best option.  No Primary Care Doctor: - Call Health Connect at  [phone] - they can help you locate a primary care doctor that  accepts your insurance, provides certain services, etc. - Physician Referral Service- [phone]  Chronic Pain Problems: Organization         Address  Phone   Notes  [facility]  [phone] Patients need to be referred by their primary care doctor.   Medication Assistance: Organization         Address  Phone   Notes  [facility] Medication [facility] [address] [place] [place], [place] [address] [phone] --Must be a resident of [facility] -- Must have NO insurance coverage whatsoever (no Medicaid/ Medicare, etc.) -- The pt. MUST have a primary care doctor that directs their care regularly and follows them in the community   [facility]  [phone]   [employer]  [phone]    Agencies that provide inexpensive medical care: Organization         Address  Phone   Notes  [facility]  [phone]   [facility]    [phone]   [facility] [address] [place], [place] [address] [phone]   [facility] [address] [place] [place] [phone]   Planned Parenthood    [phone]   [facility]    [phone]   [facility]  [address], [place] Phone:  [phone], Fax:  [phone] Hours of Operation:  9 am - 6 pm, M-F.  Also accepts Medicaid/Medicare and self-pay.  [facility]  [address], [place] Phone: [phone], Fax: [phone]. Hours of Operation:  8:30 am - 5:30 pm, M-F.  Also accepts Medicaid and self-pay.  [facility] [place] [address], [place] Phone: [phone]   [facility] [address] Natasha Bence [place], [place] [phone], Ext. [phone] Mondays & Thursdays: 7-9 AM.  First 15 patients are seen on a first come, first serve basis.    Medicaid-accepting [facility] Providers:  Organization         Address  Phone   Notes  [facility] [address], [place] [phone] Also accepts self-pay patients.  [facility] [address] Laurell Josephs [place], [place]  [phone]   [facility] [address], [place] [phone]   [facility] [address] [place] [phone]   Renaye Rakers [address], [place]   [phone] Only accepts [place] patients after they have their name applied to their card.   Self-Pay (no insurance) in [facility]:  Organization         Address  Phone   Notes  Sickle Cell Patients, [facility] Medicine [address] [place], [place] [phone]   [facility] [address] [place], [place] [phone]   [facility] [place]  [address], [place] [phone]   [facility]/Dr. Osei-Bonsu  [address], [place] or [address], [place] [phone] Phone number for both [place] and [place] locations is the same.  [facility] and [facility] [address] [place] [phone]   [facility] [address] [place] or [address] [phone]   [facility] [address] [place] [phone], phone; [phone], fax Sees patients 1st and 3rd Saturday of every month.  Must not qualify for public or private insurance (i.e. Medicaid, Medicare, [place] Health Choice, Veterans' Benefits)  Household income should be no more than 200% of the poverty level The clinic cannot treat you if you are pregnant or think you are pregnant   Sexually transmitted diseases are not treated at the clinic.    Dental Care: Organization         Address  Phone  Notes  [facility] Department of [facility] [address] [place], [place] [phone] Accepts children up to age 41 who are enrolled in [place] or [place] Health Choice; pregnant women with a Medicaid card; and children who have applied for Medicaid or  Health Choice, but were declined, whose parents can pay a reduced fee at time of service.  [facility] Department of  [facility]  [address], [place] [phone] Accepts children up to age 28 who are enrolled in Medicaid or [place] Health Choice; pregnant women with a Medicaid card; and children who have applied for Medicaid or [place] Health Choice, but were declined, whose parents can pay a reduced fee at time of service.  [facility]  [address] [place], [place] [phone] Patients are seen by appointment only. Walk-ins are not accepted. [facility] will see patients 78 years of age and older. Monday - Tuesday (8am-5pm) Most Wednesdays (8:30-5pm) $30 per visit, cash only  [facility] Adult [facility] Access [facility]  [address], [facility] [phone] Patients are seen by appointment only. Walk-ins are not accepted. [facility] will see patients 36 years of age and older. One Wednesday Evening (Monthly: Volunteer Based).  $30 per visit, cash only  [employer]  [phone] for adults; Children under age 82, call Graduate Pediatric Dentistry at [phone]. Children aged 75-14, please call [phone] to request a pediatric application.  Dental services are provided in all areas of dental care including fillings, crowns and bridges, complete and partial dentures, implants, gum treatment, root canals, and extractions. Preventive care is also provided. Treatment is provided to both adults and children. Patients  are selected via a lottery and there is often a waiting list.   [facility] [address] [place]  [phone] [email or web address]   [facility] [address] [place], [place] [phone], Ext. [phone] Second and Fourth Thursday of each month, opens at 6:30 AM; Clinic ends at 9 AM.  Patients are seen on a first-come first-served basis, and a limited number are seen during each clinic.   [facility]  [address] Ether Griffins [place], [place] [phone]   Eligibility Requirements You must have lived in [place], [place], or [place] counties for at least the last three months.   You cannot be eligible for state or federal sponsored [place], including [employer], [place], or [employer].   You generally cannot be eligible for healthcare insurance through your employer.    How to apply: Eligibility screenings are held every Tuesday and Wednesday afternoon from 1:00 pm until 4:00 pm. You do not need an appointment for the interview!  [facility] [address], [place], [place] [phone]   [facility] Health Department  [phone]   [facility] Health Department  [phone]   [facility] Health Department  [phone]    Behavioral Health Resources in the Community: Intensive Outpatient Programs Organization         Address  Phone  Notes  [facility] Services [address], [place], [place] [phone]   [facility] Outpatient [address] [place], [place] [phone]   [facility]: [facility] [address], [place], [place]  [phone]   [facility] Mental Health [address]  [place], [place] [phone] or [phone]   Substance Abuse Resources Organization         Address  Phone  Notes  [facility] Drug Services  [phone]   [facility]  [phone]   [facility] [place]  [phone]   Floydene Flock  [phone]    Residential & Outpatient Substance Abuse Program  [phone]   Psychological Services Organization         Address  Phone  Notes  [facility] Behavioral Health  [phone]- [phone]   [facility]  [phone]- [phone]   [facility] [address], [place] [phone] or [phone]    Mobile Crisis Teams Organization         Address  Phone  Notes  [facility], Mobile Crisis Care Unit  [phone]   [facility]  [address] [place], [place] [phone]   Doristine LocksSharon DeEsch [address] [place] [phone]    Self-Help/Support Groups Organization         Address  Phone             Notes  [facility] [place] - variety of support groups  336- [phone] Call for more information  Narcotics Anonymous (NA), [facility] [address], [place]  2 meetings at this location   Statisticianesidential Treatment Programs Organization         Address  Phone  Notes  [facility] [address],    [place]  [phone]   [facility]  [address], [place] [phone], [place], [place] [phone]   [facility] [address], [place] [phone] Admissions: 8am-3pm M-F  [facility] [address]    [place], [place] [phone]   [facility] [address], [place], [place] [phone]   [facility] [address]  [place], [place] [phone]   [facility] - Intensive Outpatient [address], [place], [place] [phone]   [facility] ([facility] [address]  [place], [place] [phone] or [phone]   [facility] ([facility]) [address] [place], [place] [phone] Accepts Medicaid  [facility] [address]  [place] [phone] Substance Abuse/Addiction Treatment   [facility]  Resources Organization         Address  Phone  Notes  [facility]  [phone]   Angie FavaJulie Brannon, PhD [address] [place], [place]   [phone](336) [phone] or [phone](336) [phone]   [facility]   [address] [place], [place] [phone](336) [phone]   [facility] [address], [place], [place] [phone] Insurance/Medicaid/sponsorship through [facility]  [facility] [address]                                    [place], [place] [phone] Therapy/tele-psych/case  [facility] [address]   [place], [place] [phone]) [phone]    Dr. Lolly MustacheArfeen  [phone](336) [phone]   [facility]  [employer] [facility] 1) [address], [place] 2) [address], [place] 3)  [address], [place] [phone](336) [phone]) [phone]  [phone]   [facility] Child Abuse Hotline [phone]) [phone] or [phone]) [phone] (After Hours)

## 2014-01-17 LAB — GC/CHLAMYDIA PROBE AMP
CT PROBE, AMP APTIMA: NEGATIVE
GC PROBE AMP APTIMA: NEGATIVE

## 2014-01-17 NOTE — ED Provider Notes (Signed)
Medical screening examination/treatment/procedure(s) were performed by non-physician practitioner and as supervising physician I was immediately available for consultation/collaboration.  Gerhard Munchobert Neeley Sedivy, MD 01/17/14 [phone]

## 2014-03-04 ENCOUNTER — Encounter: Payer: Self-pay | Admitting: Advanced Practice Midwife

## 2014-03-04 ENCOUNTER — Telehealth: Payer: Self-pay | Admitting: *Deleted

## 2014-03-04 ENCOUNTER — Ambulatory Visit (INDEPENDENT_AMBULATORY_CARE_PROVIDER_SITE_OTHER): Payer: Self-pay | Admitting: Advanced Practice Midwife

## 2014-03-04 VITALS — BP 116/82 | HR 108 | Temp 98.2°F | Wt 94.4 lb

## 2014-03-04 DIAGNOSIS — O0932 Supervision of pregnancy with insufficient antenatal care, second trimester: Secondary | ICD-10-CM

## 2014-03-04 DIAGNOSIS — Z118 Encounter for screening for other infectious and parasitic diseases: Secondary | ICD-10-CM

## 2014-03-04 DIAGNOSIS — Z113 Encounter for screening for infections with a predominantly sexual mode of transmission: Secondary | ICD-10-CM

## 2014-03-04 DIAGNOSIS — M419 Scoliosis, unspecified: Secondary | ICD-10-CM

## 2014-03-04 DIAGNOSIS — Z124 Encounter for screening for malignant neoplasm of cervix: Secondary | ICD-10-CM

## 2014-03-04 DIAGNOSIS — Z23 Encounter for immunization: Secondary | ICD-10-CM

## 2014-03-04 DIAGNOSIS — Z3492 Encounter for supervision of normal pregnancy, unspecified, second trimester: Secondary | ICD-10-CM

## 2014-03-04 LAB — POCT URINALYSIS DIP (DEVICE)
Bilirubin Urine: NEGATIVE
GLUCOSE, UA: NEGATIVE mg/dL
Hgb urine dipstick: NEGATIVE
Ketones, ur: 40 mg/dL — AB
Leukocytes, UA: NEGATIVE
NITRITE: NEGATIVE
Protein, ur: NEGATIVE mg/dL
Specific Gravity, Urine: 1.02 (ref 1.005–1.030)
Urobilinogen, UA: 0.2 mg/dL (ref 0.0–1.0)
pH: 8.5 — ABNORMAL HIGH (ref 5.0–8.0)

## 2014-03-04 MED ORDER — CYCLOBENZAPRINE HCL 10 MG PO TABS: 10.0000 mg | ORAL_TABLET | Freq: Three times a day (TID) | ORAL | Status: DC | PRN

## 2014-03-04 NOTE — Progress Notes (Signed)
New OB. See Smart Set  Subjective:    Judy Graham is a G1P0 4584w6d being seen today for her first obstetrical visit.  Her obstetrical history is significant for Late to care. Patient does intend to breast feed. Pregnancy history fully reviewed.  Patient reports backache and Has scoliosis.  Filed Vitals:   03/04/14 1004  BP: 116/82  Pulse: 108  Temp: 98.2 F (36.8 C)  Weight: 94 lb 6.4 oz (42.82 kg)    HISTORY: OB History  Gravida Para Term Preterm AB SAB TAB Ectopic Multiple Living  1             # Outcome Date GA Lbr Len/2nd Weight Sex Delivery Anes PTL Lv  1 Current              Past Medical History  Diagnosis Date  . Scoliosis    Past Surgical History  Procedure Laterality Date  . Back surgery    . Knee surgery     Family History  Problem Relation Age of Onset  . Cancer Mother   . Cancer Maternal Grandmother   . Diabetes Paternal Grandmother   . Stroke Paternal Grandmother      Exam    Uterus:  Fundal Height: 16 cm  Pelvic Exam:    Perineum: No Hemorrhoids, Normal Perineum   Vulva: Bartholin's, Urethra, Skene's normal   Vagina:  normal mucosa, normal discharge   pH:    Cervix: nulliparous appearance   Adnexa: no mass, fullness, tenderness   Bony Pelvis: gynecoid  System: Breast:  normal appearance, no masses or tenderness   Skin: normal coloration and turgor, no rashes    Neurologic: oriented, grossly non-focal   Extremities: normal strength, tone, and muscle mass   HEENT neck supple with midline trachea   Mouth/Teeth mucous membranes moist, pharynx normal without lesions   Neck supple and no masses   Cardiovascular: regular rate and rhythm   Respiratory:  appears well, vitals normal, no respiratory distress, acyanotic, normal RR, ear and throat exam is normal, neck free of mass or lymphadenopathy, chest clear, no wheezing, crepitations, rhonchi, normal symmetric air entry   Abdomen: soft, non-tender; bowel sounds normal; no masses,  no  organomegaly   Urinary: urethral meatus normal      Assessment:    Pregnancy: G1P0 Patient Active Problem List   Diagnosis Date Noted  . Late prenatal care affecting pregnancy in second trimester, antepartum 03/04/2014        Plan:     Initial labs drawn. Prenatal vitamins. Problem list reviewed and updated. Genetic Screening discussed Quad Screen: ordered.  Ultrasound discussed; fetal survey: ordered.  Follow up in 4 weeks. 50% of 30 min visit spent on counseling and coordination of care.   Discussed back pain/scoliosis. States her doctor told her she could not have a vaginal birth or epidural . I told her she may be able to. Will Rx Flexeril for pain.    [facility],Cohen Doleman 03/04/2014

## 2014-03-04 NOTE — Progress Notes (Signed)
Initial prenatal visit  

## 2014-03-04 NOTE — Patient Instructions (Signed)
Second Trimester of Pregnancy The second trimester is from week 13 through week 28, months 4 through 6. The second trimester is often a time when you feel your best. Your body has also adjusted to being pregnant, and you begin to feel better physically. Usually, morning sickness has lessened or quit completely, you may have more energy, and you may have an increase in appetite. The second trimester is also a time when the fetus is growing rapidly. At the end of the sixth month, the fetus is about 9 inches long and weighs about 1 pounds. You will likely begin to feel the baby move (quickening) between 18 and 20 weeks of the pregnancy. BODY CHANGES Your body goes through many changes during pregnancy. The changes vary from woman to woman.   Your weight will continue to increase. You will notice your lower abdomen bulging out.  You may begin to get stretch marks on your hips, abdomen, and breasts.  You may develop headaches that can be relieved by medicines approved by your health care provider.  You may urinate more often because the fetus is pressing on your bladder.  You may develop or continue to have heartburn as a result of your pregnancy.  You may develop constipation because certain hormones are causing the muscles that push waste through your intestines to slow down.  You may develop hemorrhoids or swollen, bulging veins (varicose veins).  You may have back pain because of the weight gain and pregnancy hormones relaxing your joints between the bones in your pelvis and as a result of a shift in weight and the muscles that support your balance.  Your breasts will continue to grow and be tender.  Your gums may bleed and may be sensitive to brushing and flossing.  Dark spots or blotches (chloasma, mask of pregnancy) may develop on your face. This will likely fade after the baby is born.  A dark line from your belly button to the pubic area (linea nigra) may appear. This will likely fade  after the baby is born.  You may have changes in your hair. These can include thickening of your hair, rapid growth, and changes in texture. Some women also have hair loss during or after pregnancy, or hair that feels dry or thin. Your hair will most likely return to normal after your baby is born. WHAT TO EXPECT AT YOUR PRENATAL VISITS During a routine prenatal visit:  You will be weighed to make sure you and the fetus are growing normally.  Your blood pressure will be taken.  Your abdomen will be measured to track your baby's growth.  The fetal heartbeat will be listened to.  Any test results from the previous visit will be discussed. Your health care provider may ask you:  How you are feeling.  If you are feeling the baby move.  If you have had any abnormal symptoms, such as leaking fluid, bleeding, severe headaches, or abdominal cramping.  If you have any questions. Other tests that may be performed during your second trimester include:  Blood tests that check for:  Low iron levels (anemia).  Gestational diabetes (between 24 and 28 weeks).  Rh antibodies.  Urine tests to check for infections, diabetes, or protein in the urine.  An ultrasound to confirm the proper growth and development of the baby.  An amniocentesis to check for possible genetic problems.  Fetal screens for spina bifida and Down syndrome. HOME CARE INSTRUCTIONS   Avoid all smoking, herbs, alcohol, and unprescribed   drugs. These chemicals affect the formation and growth of the baby.  Follow your health care provider's instructions regarding medicine use. There are medicines that are either safe or unsafe to take during pregnancy.  Exercise only as directed by your health care provider. Experiencing uterine cramps is a good sign to stop exercising.  Continue to eat regular, healthy meals.  Wear a good support bra for breast tenderness.  Do not use hot tubs, steam rooms, or saunas.  Wear your  seat belt at all times when driving.  Avoid raw meat, uncooked cheese, cat litter boxes, and soil used by cats. These carry germs that can cause birth defects in the baby.  Take your prenatal vitamins.  Try taking a stool softener (if your health care provider approves) if you develop constipation. Eat more high-fiber foods, such as fresh vegetables or fruit and whole grains. Drink plenty of fluids to keep your urine clear or pale yellow.  Take warm sitz baths to soothe any pain or discomfort caused by hemorrhoids. Use hemorrhoid cream if your health care provider approves.  If you develop varicose veins, wear support hose. Elevate your feet for 15 minutes, 3-4 times a day. Limit salt in your diet.  Avoid heavy lifting, wear low heel shoes, and practice good posture.  Rest with your legs elevated if you have leg cramps or low back pain.  Visit your dentist if you have not gone yet during your pregnancy. Use a soft toothbrush to brush your teeth and be gentle when you floss.  A sexual relationship may be continued unless your health care provider directs you otherwise.  Continue to go to all your prenatal visits as directed by your health care provider. SEEK MEDICAL CARE IF:   You have dizziness.  You have mild pelvic cramps, pelvic pressure, or nagging pain in the abdominal area.  You have persistent nausea, vomiting, or diarrhea.  You have a bad smelling vaginal discharge.  You have pain with urination. SEEK IMMEDIATE MEDICAL CARE IF:   You have a fever.  You are leaking fluid from your vagina.  You have spotting or bleeding from your vagina.  You have severe abdominal cramping or pain.  You have rapid weight gain or loss.  You have shortness of breath with chest pain.  You notice sudden or extreme swelling of your face, hands, ankles, feet, or legs.  You have not felt your baby move in over an hour.  You have severe headaches that do not go away with  medicine.  You have vision changes. Document Released: 03/29/2001 Document Revised: 04/09/2013 Document Reviewed: 06/05/2012 ExitCare Patient Information 2015 [employer]. This information is not intended to replace advice given to you by your health care provider. Make sure you discuss any questions you have with your health care provider.  

## 2014-03-04 NOTE — Telephone Encounter (Signed)
Pt called nurse line and requested medication for morning sickness, states she forgot to ask at her appointment this am.  Contacted patient, informed patient that she can try Vitamin B6 100mg  BID or [place] as directed. Pt verbalizes understanding.

## 2014-03-05 LAB — PRENATAL PROFILE (SOLSTAS)
ANTIBODY SCREEN: NEGATIVE
Basophils Absolute: 0 10*3/uL (ref 0.0–0.1)
Basophils Relative: 0 % (ref 0–1)
EOS PCT: 1 % (ref 0–5)
Eosinophils Absolute: 0.1 10*3/uL (ref 0.0–0.7)
HCT: 36.2 % (ref 36.0–46.0)
HEMOGLOBIN: 12.6 g/dL (ref 12.0–15.0)
HIV 1&2 Ab, 4th Generation: NONREACTIVE
Hepatitis B Surface Ag: NEGATIVE
LYMPHS ABS: 1.6 10*3/uL (ref 0.7–4.0)
Lymphocytes Relative: 15 % (ref 12–46)
MCH: 30.3 pg (ref 26.0–34.0)
MCHC: 34.8 g/dL (ref 30.0–36.0)
MCV: 87 fL (ref 78.0–100.0)
MONOS PCT: 5 % (ref 3–12)
MPV: 10 fL (ref 9.4–12.4)
Monocytes Absolute: 0.5 10*3/uL (ref 0.1–1.0)
Neutro Abs: 8.4 10*3/uL — ABNORMAL HIGH (ref 1.7–7.7)
Neutrophils Relative %: 79 % — ABNORMAL HIGH (ref 43–77)
Platelets: 308 10*3/uL (ref 150–400)
RBC: 4.16 MIL/uL (ref 3.87–5.11)
RDW: 13.9 % (ref 11.5–15.5)
RUBELLA: 1.72 {index} — AB (ref <0.90)
Rh Type: POSITIVE
WBC: 10.6 10*3/uL — ABNORMAL HIGH (ref 4.0–10.5)

## 2014-03-05 LAB — AFP, QUAD SCREEN
AFP: 102.3 ng/mL
Age Alone: 1:1110 {titer}
Curr Gest Age: 15.6 wks.days
Down Syndrome Scr Risk Est: <1:38500 {titer}
HCG TOTAL: 78.15 [IU]/mL
INH: 613.7 pg/mL
INTERPRETATION-AFP: NEGATIVE
MOM FOR AFP: 2.52
MoM for INH: 2.34
MoM for hCG: 1.59
OPEN SPINA BIFIDA: POSITIVE — AB
Osb Risk: 1:231 {titer}
Tri 18 Scr Risk Est: NEGATIVE
Trisomy 18 (Edward) Syndrome Interp.: 1:234000 {titer}
UE3 MOM: 1.11
uE3 Value: 0.89 ng/mL

## 2014-03-05 LAB — CYTOLOGY - PAP

## 2014-03-07 LAB — PRESCRIPTION MONITORING PROFILE (19 PANEL)
Amphetamine/Meth: NEGATIVE ng/mL
BARBITURATE SCREEN, URINE: NEGATIVE ng/mL
BENZODIAZEPINE SCREEN, URINE: NEGATIVE ng/mL
BUPRENORPHINE, URINE: NEGATIVE ng/mL
CARISOPRODOL, URINE: NEGATIVE ng/mL
Cocaine Metabolites: NEGATIVE ng/mL
Creatinine, Urine: 143.82 mg/dL (ref >20.0)
ECSTASY: NEGATIVE ng/mL
Fentanyl, Ur: NEGATIVE ng/mL
METHADONE SCREEN, URINE: NEGATIVE ng/mL
Meperidine, Ur: NEGATIVE ng/mL
Methaqualone: NEGATIVE ng/mL
NITRITES URINE, INITIAL: NEGATIVE ug/mL
OPIATE SCREEN, URINE: NEGATIVE ng/mL
OXYCODONE SCRN UR: NEGATIVE ng/mL
PHENCYCLIDINE, UR: NEGATIVE ng/mL
Propoxyphene: NEGATIVE ng/mL
Tapentadol, urine: NEGATIVE ng/mL
Tramadol Scrn, Ur: NEGATIVE ng/mL
Zolpidem, Urine: NEGATIVE ng/mL
pH, Initial: 7.6 pH (ref 4.5–8.9)

## 2014-03-07 LAB — CULTURE, OB URINE: Colony Count: 40000

## 2014-03-07 LAB — CANNABANOIDS (GC/LC/MS), URINE: THC-COOH (GC/LC/MS), ur confirm: 2012 ng/mL — AB (ref <5)

## 2014-03-08 ENCOUNTER — Encounter (HOSPITAL_COMMUNITY): Payer: Self-pay | Admitting: Advanced Practice Midwife

## 2014-03-08 ENCOUNTER — Other Ambulatory Visit (HOSPITAL_COMMUNITY): Payer: Self-pay | Admitting: Advanced Practice Midwife

## 2014-03-08 DIAGNOSIS — F1290 Cannabis use, unspecified, uncomplicated: Secondary | ICD-10-CM | POA: Insufficient documentation

## 2014-03-08 DIAGNOSIS — O2342 Unspecified infection of urinary tract in pregnancy, second trimester: Secondary | ICD-10-CM | POA: Insufficient documentation

## 2014-03-08 DIAGNOSIS — O280 Abnormal hematological finding on antenatal screening of mother: Secondary | ICD-10-CM | POA: Insufficient documentation

## 2014-03-08 MED ORDER — CEPHALEXIN 500 MG PO CAPS: 500.0000 mg | ORAL_CAPSULE | Freq: Four times a day (QID) | ORAL | Status: DC

## 2014-03-08 NOTE — Progress Notes (Signed)
UTI  _-  Rx sent for Keflex  Increased Spina Bifida risk on Quad   __ referred to MFM

## 2014-03-11 ENCOUNTER — Telehealth: Payer: Self-pay | Admitting: *Deleted

## 2014-03-11 DIAGNOSIS — O280 Abnormal hematological finding on antenatal screening of mother: Secondary | ICD-10-CM

## 2014-03-11 NOTE — Telephone Encounter (Signed)
Genetic Counseling appointment for 03/12/14 at 1000.  Contacted patient, informed of results of quad screen and appointment with MFM on 03/12/14 @ 1000.  Location given.  Informed patient of UTI and prescription that was ordered and sent to pharmacy.  Stressed importance of taking antibiotic as prescribed.  Pt verbalizes understanding.

## 2014-03-11 NOTE — Telephone Encounter (Signed)
-----   Message from Aviva SignsMarie L Williams, CNM sent at 03/08/2014  5:54 PM EST ----- Regarding: Rx and referral Has UTI - Rx Keflex ( I did)  Increased spina bifida risk on quad screen, needs MFM referral  Hilda LiasMarie

## 2014-03-12 ENCOUNTER — Ambulatory Visit (HOSPITAL_COMMUNITY): Admission: RE | Admit: 2014-03-12 | Payer: Self-pay | Source: Ambulatory Visit

## 2014-03-12 ENCOUNTER — Encounter (HOSPITAL_COMMUNITY): Payer: Self-pay | Admitting: Emergency Medicine

## 2014-03-12 ENCOUNTER — Other Ambulatory Visit: Payer: Self-pay | Admitting: Advanced Practice Midwife

## 2014-03-12 ENCOUNTER — Encounter (HOSPITAL_COMMUNITY): Payer: Self-pay

## 2014-03-12 ENCOUNTER — Ambulatory Visit (HOSPITAL_COMMUNITY)
Admission: RE | Admit: 2014-03-12 | Discharge: 2014-03-12 | Disposition: A | Discharge status: Home / Self Care | Payer: Self-pay | Source: Ambulatory Visit | Attending: Advanced Practice Midwife | Admitting: Advanced Practice Midwife

## 2014-03-12 ENCOUNTER — Emergency Department (HOSPITAL_COMMUNITY)
Admission: EM | Admit: 2014-03-12 | Discharge: 2014-03-12 | Disposition: A | Discharge status: Home / Self Care | Payer: Self-pay | Attending: Emergency Medicine | Admitting: Emergency Medicine

## 2014-03-12 DIAGNOSIS — R772 Abnormality of alphafetoprotein: Secondary | ICD-10-CM

## 2014-03-12 DIAGNOSIS — Z3492 Encounter for supervision of normal pregnancy, unspecified, second trimester: Secondary | ICD-10-CM

## 2014-03-12 DIAGNOSIS — O09899 Supervision of other high risk pregnancies, unspecified trimester: Secondary | ICD-10-CM | POA: Insufficient documentation

## 2014-03-12 DIAGNOSIS — IMO0002 Reserved for concepts with insufficient information to code with codable children: Secondary | ICD-10-CM | POA: Insufficient documentation

## 2014-03-12 DIAGNOSIS — O352XX0 Maternal care for (suspected) hereditary disease in fetus, not applicable or unspecified: Secondary | ICD-10-CM | POA: Insufficient documentation

## 2014-03-12 DIAGNOSIS — K047 Periapical abscess without sinus: Secondary | ICD-10-CM | POA: Insufficient documentation

## 2014-03-12 DIAGNOSIS — Z8739 Personal history of other diseases of the musculoskeletal system and connective tissue: Secondary | ICD-10-CM | POA: Insufficient documentation

## 2014-03-12 DIAGNOSIS — Z3A16 16 weeks gestation of pregnancy: Secondary | ICD-10-CM | POA: Insufficient documentation

## 2014-03-12 DIAGNOSIS — Z720 Tobacco use: Secondary | ICD-10-CM | POA: Insufficient documentation

## 2014-03-12 DIAGNOSIS — O280 Abnormal hematological finding on antenatal screening of mother: Secondary | ICD-10-CM | POA: Insufficient documentation

## 2014-03-12 DIAGNOSIS — G930 Cerebral cysts: Secondary | ICD-10-CM | POA: Insufficient documentation

## 2014-03-12 DIAGNOSIS — O289 Unspecified abnormal findings on antenatal screening of mother: Secondary | ICD-10-CM | POA: Insufficient documentation

## 2014-03-12 DIAGNOSIS — O0930 Supervision of pregnancy with insufficient antenatal care, unspecified trimester: Secondary | ICD-10-CM | POA: Insufficient documentation

## 2014-03-12 DIAGNOSIS — O0932 Supervision of pregnancy with insufficient antenatal care, second trimester: Secondary | ICD-10-CM

## 2014-03-12 DIAGNOSIS — O288 Other abnormal findings on antenatal screening of mother: Secondary | ICD-10-CM

## 2014-03-12 DIAGNOSIS — Z36 Encounter for antenatal screening of mother: Secondary | ICD-10-CM | POA: Insufficient documentation

## 2014-03-12 DIAGNOSIS — Z792 Long term (current) use of antibiotics: Secondary | ICD-10-CM | POA: Insufficient documentation

## 2014-03-12 MED ORDER — HYDROCODONE-ACETAMINOPHEN 5-325 MG PO TABS
1.0000 | ORAL_TABLET | Freq: Once | ORAL | Status: AC
  Administered 2014-03-12: 1 via ORAL
  Filled 2014-03-12: qty 1

## 2014-03-12 MED ORDER — PENICILLIN V POTASSIUM 500 MG PO TABS
500.0000 mg | ORAL_TABLET | Freq: Once | ORAL | Status: AC
  Administered 2014-03-12: 500 mg via ORAL
  Filled 2014-03-12: qty 1

## 2014-03-12 MED ORDER — HYDROCODONE-ACETAMINOPHEN 5-325 MG PO TABS: 1.0000 | ORAL_TABLET | Freq: Four times a day (QID) | ORAL | Status: DC | PRN

## 2014-03-12 NOTE — ED Notes (Signed)
AVS explained in detail. Knows to continue abx regimen she is taking at home. Knows not to drink/drive with prescribed medications. Patient is not driving home. No other questions/concerns.

## 2014-03-12 NOTE — ED Notes (Signed)
Pt reports dental pain that started yesterday upper front and today her upper lip is also swollen. Pt denies sob.

## 2014-03-12 NOTE — Discharge Instructions (Signed)
Abscessed Tooth An abscessed tooth is an infection around your tooth. It may be caused by holes or damage to the tooth (cavity) or a dental disease. An abscessed tooth causes mild to very bad pain in and around the tooth. See your dentist right away if you have tooth or gum pain. HOME CARE  Take your medicine as told. Finish it even if you start to feel better.  Do not drive after taking pain medicine.  Rinse your mouth (gargle) often with salt water ( teaspoon salt in 8 ounces of warm water).  Do not apply heat to the outside of your face. GET HELP RIGHT AWAY IF:   You have a temperature by mouth above 102 F (38.9 C), not controlled by medicine.  You have chills and a very bad headache.  You have problems breathing or swallowing.  Your mouth will not open.  You develop puffiness (swelling) on the neck or around the eye.  Your pain is not helped by medicine.  Your pain is getting worse instead of better. MAKE SURE YOU:   Understand these instructions.  Will watch your condition.  Will get help right away if you are not doing well or get worse. Document Released: 09/21/2007 Document Revised: 06/27/2011 Document Reviewed: 07/13/2010 [facility] Patient Information 2015 [place]. This information is not intended to replace advice given to you by your health care provider. Make sure you discuss any questions you have with your health care provider. Please make sure to pickup her prescription for antibiotics tomorrow and take as directed

## 2014-03-12 NOTE — ED Provider Notes (Signed)
CSN: [id number]     Arrival date & time 03/12/14  2123 History  This chart was scribed for a non-physician practitioner, Ernie HewGail L Arsh Feutz, NP working with Rolan BuccoMelanie Belfi, MD by [place] Peace, ED Scribe. The patient was seen in [facility]. The patient's care was started at 10:25 PM.    Chief Complaint  Patient presents with  . Dental Pain  . Oral Swelling     Patient is a 23 y.o. female presenting with tooth pain. The history is provided by the patient. No language interpreter was used.  Dental Pain HPI Comments: Judy Cruiseicole Dinsmore is a 23 y.o. female who presents to the Emergency Department complaining of dental pain onset yesterday. Pain specifically to upper front teeth. Pt also reports her upper lip is so swollen. She denies SOB. Pt is currently pregnant.    Past Medical History  Diagnosis Date  . Scoliosis    Past Surgical History  Procedure Laterality Date  . Back surgery    . Knee surgery     Family History  Problem Relation Age of Onset  . Cancer Mother   . Cancer Maternal Grandmother   . Diabetes Paternal Grandmother   . Stroke Paternal Grandmother    History  Substance Use Topics  . Smoking status: Current Every Day Smoker -- 0.50 packs/day  . Smokeless tobacco: Not on file  . Alcohol Use: No     Comment: occ   OB History    Gravida Para Term Preterm AB TAB SAB Ectopic Multiple Living   3 0 0 0 2 0 2 0 0 0      Review of Systems  HENT: Positive for dental problem.        Swollen upper lip.   Respiratory: Negative for shortness of breath.   All other systems reviewed and are negative.     Allergies  Clindamycin/lincomycin  Home Medications   Prior to Admission medications   Medication Sig Start Date End Date Taking? Authorizing Provider  cephALEXin (KEFLEX) 500 MG capsule Take 1 capsule (500 mg total) by mouth 4 (four) times daily. 03/08/14   Aviva SignsMarie L Williams, CNM  cyclobenzaprine (FLEXERIL) 10 MG tablet Take 1 tablet (10 mg total) by mouth every 8 (eight)  hours as needed for muscle spasms. 03/04/14 03/04/15  Aviva SignsMarie L Williams, CNM  HYDROcodone-acetaminophen (NORCO/VICODIN) 5-325 MG per tablet Take 1 tablet by mouth every 6 (six) hours as needed for moderate pain. 03/12/14   Arman FilterGail K Darrol Brandenburg, NP   BP 110/70 mmHg  Pulse 106  Temp(Src) 98.7 F (37.1 C) (Oral)  Resp 16  SpO2 100%  LMP 11/16/2013 Physical Exam  Constitutional: She is oriented to person, place, and time. She appears well-developed and well-nourished. No distress.  HENT:  Head: Normocephalic and atraumatic.  Eyes: Conjunctivae and EOM are normal.  Neck: Neck supple. No tracheal deviation present.  Cardiovascular: Normal rate.   Pulmonary/Chest: Effort normal. No respiratory distress.  Musculoskeletal: Normal range of motion.  Neurological: She is alert and oriented to person, place, and time.  Skin: Skin is warm and dry.  Psychiatric: She has a normal mood and affect. Her behavior is normal.  Nursing note and vitals reviewed.   ED Course  Procedures (including critical care time) Labs Review Labs Reviewed - No data to display  Imaging Review [place] Ob Detail + 14 Wk  03/12/2014   OBSTETRICAL ULTRASOUND: This exam was performed within a [facility]. The OB US report was generated in the [facility] system, and faxed  to the ordering physician.   This report is available in the [employer] PACS. See the [facility] Obstetric US report via the Image Link.    EKG Interpretation None     Medications  penicillin v potassium (VEETID) tablet 500 mg (500 mg Oral Given 03/12/14 2244)  HYDROcodone-acetaminophen (NORCO/VICODIN) 5-325 MG per tablet 1 tablet (1 tablet Oral Given 03/12/14 2244)    10:28 PM- Treatment plan was discussed with patient who verbalizes understanding and agrees.   MDM  Reviewing patient's chart, I see that she's been given a prescription for Keflex for her dental abscess by her primary care provider.  Encouraged the patient to pick up her prescription has  been at the pharmacy since the 21st and take it as directed Final diagnoses:  Dental abscess       I personally performed the services described in this documentation, which was scribed in my presence. The recorded information has been reviewed and is accurate.   Arman FilterGail K Kanoe Wanner, NP 03/12/14 [phone]  Rolan BuccoMelanie Belfi, MD 03/12/14 2324

## 2014-03-14 ENCOUNTER — Encounter (HOSPITAL_COMMUNITY): Payer: Self-pay | Admitting: *Deleted

## 2014-03-14 ENCOUNTER — Emergency Department (HOSPITAL_COMMUNITY)
Admission: EM | Admit: 2014-03-14 | Discharge: 2014-03-14 | Disposition: A | Discharge status: Home / Self Care | Payer: Self-pay | Attending: Emergency Medicine | Admitting: Emergency Medicine

## 2014-03-14 DIAGNOSIS — Z3490 Encounter for supervision of normal pregnancy, unspecified, unspecified trimester: Secondary | ICD-10-CM

## 2014-03-14 DIAGNOSIS — Z792 Long term (current) use of antibiotics: Secondary | ICD-10-CM | POA: Insufficient documentation

## 2014-03-14 DIAGNOSIS — O99332 Smoking (tobacco) complicating pregnancy, second trimester: Secondary | ICD-10-CM | POA: Insufficient documentation

## 2014-03-14 DIAGNOSIS — Z3A16 16 weeks gestation of pregnancy: Secondary | ICD-10-CM | POA: Insufficient documentation

## 2014-03-14 DIAGNOSIS — Z79891 Long term (current) use of opiate analgesic: Secondary | ICD-10-CM | POA: Insufficient documentation

## 2014-03-14 DIAGNOSIS — K088 Other specified disorders of teeth and supporting structures: Secondary | ICD-10-CM | POA: Insufficient documentation

## 2014-03-14 DIAGNOSIS — O99612 Diseases of the digestive system complicating pregnancy, second trimester: Secondary | ICD-10-CM | POA: Insufficient documentation

## 2014-03-14 DIAGNOSIS — Z8739 Personal history of other diseases of the musculoskeletal system and connective tissue: Secondary | ICD-10-CM | POA: Insufficient documentation

## 2014-03-14 DIAGNOSIS — F17210 Nicotine dependence, cigarettes, uncomplicated: Secondary | ICD-10-CM | POA: Insufficient documentation

## 2014-03-14 DIAGNOSIS — Z79899 Other long term (current) drug therapy: Secondary | ICD-10-CM | POA: Insufficient documentation

## 2014-03-14 DIAGNOSIS — K029 Dental caries, unspecified: Secondary | ICD-10-CM

## 2014-03-14 MED ORDER — AMOXICILLIN 500 MG PO CAPS: 500.0000 mg | ORAL_CAPSULE | Freq: Three times a day (TID) | ORAL | Status: DC

## 2014-03-14 MED ORDER — HYDROCODONE-ACETAMINOPHEN 5-325 MG PO TABS: ORAL_TABLET | ORAL | Status: DC

## 2014-03-14 MED ORDER — AMOXICILLIN 500 MG PO CAPS
500.0000 mg | ORAL_CAPSULE | Freq: Once | ORAL | Status: AC
  Administered 2014-03-14: 500 mg via ORAL
  Filled 2014-03-14: qty 1

## 2014-03-14 NOTE — Discharge Instructions (Signed)
As we have discussed, there is a risk to the fetus when you take narcotics like vicodin, please weigh this risk when you decide to take this pain medicine. Do not drink, drive, operate heavy machinery or perform other critical task when taking vicodin because it will slow your reactions.  Take percocet for breakthrough pain, do not drink alcohol, drive, care for children or do other critical tasks while taking percocet.  Return to the emergency room for fever, change in vision, redness to the face that rapidly spreads towards the eye, nausea or vomiting, difficulty swallowing or shortness of breath.   Apply warm compresses to jaw throughout the day.   ake your antibiotics as directed and to the end of the course.   Followup with a dentist is very important for ongoing evaluation and management of recurrent dental pain. Return to emergency department for emergent changing or worsening symptoms. Dental Pain A tooth ache may be caused by cavities (tooth decay). Cavities expose the nerve of the tooth to air and hot or cold temperatures. It may come from an infection or abscess (also called a boil or furuncle) around your tooth. It is also often caused by dental caries (tooth decay). This causes the pain you are having. DIAGNOSIS  Your caregiver can diagnose this problem by exam. TREATMENT   If caused by an infection, it may be treated with medications which kill germs (antibiotics) and pain medications as prescribed by your caregiver. Take medications as directed.  Only take over-the-counter or prescription medicines for pain, discomfort, or fever as directed by your caregiver.  Whether the tooth ache today is caused by infection or dental disease, you should see your dentist as soon as possible for further care. SEEK MEDICAL CARE IF: The exam and treatment you received today has been provided on an emergency basis only. This is not a substitute for complete medical or dental care. If your problem  worsens or new problems (symptoms) appear, and you are unable to meet with your dentist, call or return to this location. SEEK IMMEDIATE MEDICAL CARE IF:   You have a fever.  You develop redness and swelling of your face, jaw, or neck.  You are unable to open your mouth.  You have severe pain uncontrolled by pain medicine. MAKE SURE YOU:   Understand these instructions.  Will watch your condition.  Will get help right away if you are not doing well or get worse. Document Released: 04/04/2005 Document Revised: 06/27/2011 Document Reviewed: 11/21/2007 [facility] Patient Information 2015 [place], [place]. This information is not intended to replace advice given to you by your health care provider. Make sure you discuss any questions you have with your health care provider.   Low-cost dental clinic: [facility] Flemings**David  Civils  at [phone]**  **Nuala AlphaJanna Civils at [phone] [address]**    You may also call [phone]  Dental Assistance If the dentist on-call cannot see you, please use the resources below:   Patients with Medicaid: [facility] [id number], [phone] [address], [phone]  If unable to pay, or uninsured, contact [facility] [phone]) or [facility] [phone] in [place], [phone] in [facility]) to become qualified for the adult dental clinic  Other Low-Cost Community Dental Services: [facility]- [address], [place], [place], [address]    [phone], Ext. [phone]    2nd and 4th Thursday of the month at 6:30am    10 clients each day by appointment, can  sometimes see walk-in     patients if someone does not show for an appointment [facility]- [address], [place], [place], [id number]    [phone] [facility]- [address], [place], [place], [address]    [phone]  [facility]- [phone] [facility]-  [phone] [phone]- [phone]

## 2014-03-14 NOTE — ED Provider Notes (Signed)
CSN: [id number]     Arrival date & time 03/14/14  1446 History  This chart was scribed for Wynetta EmeryNicole Jarell Mcewen, PA-C, working with Tilden FossaElizabeth Rees, MD found by Elon SpannerGarrett Cook, ED Scribe. This patient was seen in room WTR5/WTR5 and the patient's care was started at 3:41 PM.   Chief Complaint  Patient presents with  . Dental Pain   The history is provided by the patient. No language interpreter was used.   HPI Comments: Judy Graham is a 23 y.o. female who presents to the Emergency Department complaining of left upper dental pain onset 3 days ago.  She reports the associated swelling began 2 days ago just on the left side but worsenedd and spread bilaterally yesterday days ago.  Patient reports she was seen two days ago in the ED where she was referred to a dentist and has an appointment for 11/30.  She was also prescribed Vicodin but is currently out.  She was not prescribed antibiotics.  Patient is 4 months pregnant.  Denies fever/chills, difficulty opening jaw, difficulty swallowing, SOB,  neck swelling.   Past Medical History  Diagnosis Date  . Scoliosis    Past Surgical History  Procedure Laterality Date  . Back surgery    . Knee surgery     Family History  Problem Relation Age of Onset  . Cancer Mother   . Cancer Maternal Grandmother   . Diabetes Paternal Grandmother   . Stroke Paternal Grandmother    History  Substance Use Topics  . Smoking status: Current Every Day Smoker -- 0.50 packs/day  . Smokeless tobacco: Not on file  . Alcohol Use: No     Comment: occ   OB History    Gravida Para Term Preterm AB TAB SAB Ectopic Multiple Living   3 0 0 0 2 0 2 0 0 0      Review of Systems A complete 10 system review of systems was obtained and all systems are negative except as noted in the HPI and PMH.   Allergies  Clindamycin/lincomycin  Home Medications   Prior to Admission medications   Medication Sig Start Date End Date Taking? Authorizing Provider  amoxicillin (AMOXIL)  500 MG capsule Take 1 capsule (500 mg total) by mouth 3 (three) times daily. 03/14/14   Francies Kindsey Eblin, PA-C  cephALEXin (KEFLEX) 500 MG capsule Take 1 capsule (500 mg total) by mouth 4 (four) times daily. 03/08/14   Aviva SignsMarie L Williams, CNM  cyclobenzaprine (FLEXERIL) 10 MG tablet Take 1 tablet (10 mg total) by mouth every 8 (eight) hours as needed for muscle spasms. 03/04/14 03/04/15  Aviva SignsMarie L Williams, CNM  HYDROcodone-acetaminophen (NORCO/VICODIN) 5-325 MG per tablet Take 1 tablet by mouth every 6 (six) hours as needed for moderate pain. 03/12/14   Arman FilterGail K Schulz, NP  HYDROcodone-acetaminophen (NORCO/VICODIN) 5-325 MG per tablet Take 1-2 tablets by mouth every 6 hours as needed for pain. 03/14/14   Takyla Anai Lipson, PA-C   BP 111/70 mmHg  Pulse 71  Temp(Src) 98.8 F (37.1 C) (Oral)  Resp 18  SpO2 98%  LMP 11/16/2013 Physical Exam  Constitutional: She is oriented to person, place, and time. She appears well-developed and well-nourished. No distress.  HENT:  Head: Normocephalic and atraumatic.  Mouth/Throat: Uvula is midline and oropharynx is clear and moist. No trismus in the jaw. No uvula swelling. No oropharyngeal exudate, posterior oropharyngeal edema, posterior oropharyngeal erythema or tonsillar abscesses.  Generally poor dentition, no gingival swelling, erythema or tenderness to palpation. Patient is handling their  secretions. There is no tenderness to palpation or firmness underneath tongue bilaterally. No trismus.    Eyes: Conjunctivae and EOM are normal.  Neck: Normal range of motion.  Cardiovascular: Normal rate, regular rhythm and intact distal pulses.   Pulmonary/Chest: Effort normal and breath sounds normal. No stridor.  Musculoskeletal: Normal range of motion.  Lymphadenopathy:    She has no cervical adenopathy.  Neurological: She is alert and oriented to person, place, and time.  Psychiatric: She has a normal mood and affect.  Nursing note and vitals reviewed.   ED  Course  Procedures (including critical care time)  DIAGNOSTIC STUDIES: Oxygen Saturation is 100% on RA, normal by my interpretation.    COORDINATION OF CARE:  3:50 PM Will prescribe pain medication and antibiotics.  Patient acknowledges and agrees with plan.    Labs Review Labs Reviewed - No data to display  Imaging Review No results found.   EKG Interpretation None      MDM   Final diagnoses:  Pain due to dental caries  Pregnancy    Filed Vitals:   03/14/14 1541  BP: 111/70  Pulse: 71  Temp: 98.8 F (37.1 C)  TempSrc: Oral  Resp: 18  SpO2: 98%    Medications  amoxicillin (AMOXIL) capsule 500 mg (500 mg Oral Given 03/14/14 1558)    Judy Graham is a 23 y.o. female presenting with dental pain associated with dental caries but no signs or symptoms of dental abscess. Patient afebrile, non toxic appearing and swallowing secretions well. I gave patient referral to dentist and stressed the importance of dental follow up for definitive management of dental issues. Patient voices understanding and is agreeable to plan.  I have advised the patient that narcotics are potentially detrimental to the fetus. I have advised her that she risks miscarriage or fetal harm if she uses them. In shared decision-making capacity she has asked for a prescription which I provided to her.  Pt has asked nurse at discharge if Rx for vicodin can be separated from the prescription for amoxicillin. Patient states she wants to full the pain medication first and knows that the pharmacist will not fill one  without other. I've explained to her that I will not do that, she will need to take both medications.  Evaluation does not show pathology that would require ongoing emergent intervention or inpatient treatment. Pt is hemodynamically stable and mentating appropriately. Discussed findings and plan with patient/guardian, who agrees with care plan. All questions answered. Return precautions discussed  and outpatient follow up given.   Discharge Medication List as of 03/14/2014  3:51 PM    START taking these medications   Details  amoxicillin (AMOXIL) 500 MG capsule Take 1 capsule (500 mg total) by mouth 3 (three) times daily., Starting 03/14/2014, Until Discontinued, Print    !! HYDROcodone-acetaminophen (NORCO/VICODIN) 5-325 MG per tablet Take 1-2 tablets by mouth every 6 hours as needed for pain., Print     !! - Potential duplicate medications found. Please discuss with provider.       I personally performed the services described in this documentation, which was scribed in my presence. The recorded information has been reviewed and is accurate.    Wynetta Emeryicole Lilton Pare, PA-C 03/14/14 1630  Tilden FossaElizabeth Rees, MD 03/15/14 0040

## 2014-03-14 NOTE — ED Notes (Signed)
Pt stated that she has been experiencing dental pain since 03/11/2014. Pt reports a bump on inner part of upper lip. Pt denies taking any medications for pain and discomfort.

## 2014-03-17 ENCOUNTER — Ambulatory Visit (HOSPITAL_COMMUNITY): Payer: Self-pay

## 2014-03-20 ENCOUNTER — Ambulatory Visit (HOSPITAL_COMMUNITY)
Admission: RE | Admit: 2014-03-20 | Discharge: 2014-03-20 | Disposition: A | Discharge status: Home / Self Care | Payer: Self-pay | Source: Ambulatory Visit | Attending: Advanced Practice Midwife | Admitting: Advanced Practice Midwife

## 2014-03-21 NOTE — Progress Notes (Signed)
Genetic Counseling  High-Risk Gestation Note  Appointment Date:  03/20/2014 Referred By: Aviva Signs, CNM Date of Birth:  Sep 08, 1990 Partner:  Micki Riley   Pregnancy History: [id number] Estimated Date of Delivery: 08/23/14 Estimated Gestational Age: [redacted]w[redacted]d Attending: Damaris Hippo, MD    Judy Graham and her partner, Mr. Micki Riley, were seen for consultation for genetic counseling because of an elevated MSAFP of 2.52 MoMs based on Quad screening through [facility] and previous ultrasound findings.    We reviewed Judy Graham's maternal serum screening result, the elevation of MSAFP, and the associated 1 in 231 risk for a fetal open neural tube defect.   We reviewed ONTDs, the typical multifactorial etiology, and variable prognosis.  In addition, we discussed additional explanations for an elevated MSAFP including: normal variation, twins, feto-maternal bleeding, a gestational dating error, abdominal wall defects, kidney differences, oligohydramnios, and placental problems.  We discussed that an unexplained elevation of MSAFP is associated with an increased risk for third trimester complications including: prematurity, low birth weight, and pre-eclampsia.  Additionally, we reviewed the Quad screen associated reduction in risks for fetal trisomy 18 (1 in 234,000) and Down syndrome (1 in 1110 to less than 1 in 38,500). Additionally, the levels of one of the other analytes analyzed, DIA, was also elevated (2.34 MoM), which can also be associated with increased risk for adverse pregnancy outcomes.   We reviewed additional available screening and diagnostic options for open neural tube defects including detailed ultrasound and amniocentesis.  We discussed the risks, limitations, and benefits of each. Regarding amniocentesis, we discussed the associated 1 in 400 risk for complications, including spontaneous pregnancy loss.  After thoughtful consideration of these options, Ms. Tinley  Hartshorne declined amniocentesis.  She understands that ultrasound cannot rule out all birth defects or genetic syndromes.  However, she was counseled that 80-90% of fetuses with ONTDs can be detected by detailed 2nd trimester ultrasound, when well visualized.    Judy Graham [facility]. Limited fetal anatomy was obtained due to early gestational age. Bilateral choroid plexus cysts were visualized at that time. Possible ventricular septal defect was also visualized. Complete ultrasound results reported separately. Follow-up ultrasound is planned for 03/26/14 to complete fetal anatomic survey, and fetal echocardiogram is scheduled for 03/28/14 with [facility].    We discussed that the second trimester genetic ultrasound is targeted at identifying features associated with aneuploidy as well as other conditions.  It has evolved as a screening tool used to provide an individualized risk assessment for Down syndrome and other trisomies.  The ability of sonography to aid in the detection of aneuploidies relies on identification of both major structural anomalies and soft markers.  She was counseled that markers have a known association with aneuploidy, which varies depending on the specific marker(s) visualized by ultrasound.    The couple was counseled that the choroid plexus is an area in the brain where cerebral spinal fluid, the fluid that bathes the brain and spinal cord, is made.  Cysts, or fluid filled sacs, are sometimes found in the choroid plexus of babies both before and after they are born.  We discussed that approximately 1% of pregnancies evaluated by ultrasound will show choroid plexus cyst (CPCs).  Literature suggests that CPCs are an ultrasound finding in approximately 30-50% of fetuses with trisomy 18, but are an isolated finding in less than 10% of fetuses with trisomy 42.  Judy Graham was  counseled that  when a patient has other risk factors for fetal trisomy 8118 (abnormal First trimester or quad screening, advanced maternal age, or another ultrasound finding), CPCs are associated with an increased risk (LR of 9) for trisomy 8818.  Newer literature suggests that in the absence of other risk factors, CPCs are likely a normal variation of development or a benign finding.  CPCs are not associated with an increased risk for fetal Down syndrome.   This couple was counseled that congenital heart defects occur at a frequency of approximately 1 in 100 births. We discussed that congenital heart defects can be genetic, environmental, or multifactorial in etiology.  This couple was counseled that congenital heart defects are prominent features in chromosome conditions, particularly autosomal trisomies. Specifically, a VSD is associated with up to approximately 18% chance for a chromosome conditions. Additionally, congenital heart defects can be caused by single gene conditions, which can follow various patterns of inheritance. Environmental causes for congenital heart defects are well described and many teratogens are known to be linked to congenital heart disease (alcohol and specific medications).  Additionally, we discussed that the incidence of congenital heart defects is increased among individuals who have insulin dependent diabetes. The remainder of the cases (>70%) are classified as multifactorial, referring to the condition being caused by a combination of both environmental and genetic causes. We discussed that follow-up ultrasound and fetal echocardiogram results would assist in risk assessment for the pregnancy. In the case of a VSD, the chance for an underlying condition is increased.   We reviewed chromosomes, nondisjunction, and the common features and poor prognosis of trisomy 8818.  We also reviewed Ms. Cornell's negative Quad screen result and the associated 1 in 234,000 risk for fetal trisomy 18.   Considering her maternal age of 23 y.o. and her normal Quad screening result, Judy Graham's risk for fetal trisomy 18 is not expected to be increased above her screen adjusted risk. However, we discussed that in the case that a fetal heart defect is present, this would alter the risk for fetal aneuploidy and would be associated with an increased risk for conditions, including trisomy 18.   Additional testing options for detection of fetal trisomy 18 and other chromosome conditions were discussed.  We discussed the screening option of noninvasive prenatal screening (NIPS)/cell free DNA testing including the conditions for which it screens, the detection rates, and false positive rates. They understand that NIPS is not diagnostic for these conditions, nor does it assess for all chromosome conditions.  In addition, we discussed the option of diagnostic testing via amniocentesis.   After consideration of all the options, the couple declined amniocentesis and NIPS today. They stated that they planned to further consider the option of NIPS and may elect to pursue this screening at the time of their follow-up ultrasound on 03/26/14.   Judy Graham was provided with written information regarding cystic fibrosis (CF) including the carrier frequency and incidence in the Caucasian population, the availability of carrier testing and prenatal diagnosis if indicated.  In addition, we discussed that CF is routinely screened for as part of the [facility] newborn screening panel.  She declined CF carrier screening today.   Both family histories were reviewed and found to be contributory for a maternal aunt for Mr. Berneda Graham with congenital heart disease, requiring surgical correction prior to age 468 years old. This aunt reportedly did not have additional health concerns from birth or in childhood. The underlying etiology is not known regarding her  congenital heart disease. Congenital heart defects occur in approximately 1% of  pregnancies.  Congenital heart defects may occur due to multifactorial influences, chromosomal abnormalities, genetic syndromes or environmental exposures.  Isolated heart defects are generally multifactorial.  Given the reported family history and assuming multifactorial inheritance, this family history would not be expected to increase the risk for congenital heart disease for the current pregnancy (a third degree relative).   Mr. Berneda Graham reported that his mother died at age 23 years old due to Acute Myeloid Leukemia. 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 He also reported that he is not currently followed by a medical professional regarding this history. Though most cancers are thought to be sporadic or due to environmental factors, some families appear to have a strong predisposition to cancers.  When considering a family history of cancer, we look for common types of cancer in multiple family members occurring at younger than typical ages. Some genes that can predispose to the development of cancer can follow autosomal dominant inheritance. There are various genes that have been identified that can increase the chance for AML. Additional information is needed to assess the risk for Mr. Berneda Graham and the current pregnancy.  We discussed the option of meeting with a cancer genetic counselor to discuss any possible screening or testing options available. If they are concerned about the family history of cancer and would like to learn more about the family's chance for an inherited cancer syndrome, his doctor may refer him or his relatives to [facility] [phone]). Without further information regarding the provided family history, an accurate genetic risk cannot be calculated. Further genetic counseling is warranted if  more information is obtained.   Ms. Rodney Cruiseicole Graham denied exposure to environmental toxins or chemical agents. She denied the use of alcohol or street drugs. She reported smoking a half pack of cigarettes per day. The associations of smoking in pregnancy were reviewed and cessation encouraged. She denied significant viral illnesses during the course of her pregnancy. Her medical and surgical histories were contributory for anxiety. Judy Graham also reported that her mother passed away several weeks ago.  She also reported a history of seizures but reported that she is currently not treated for this history. Judy Graham stated that her OB is aware of this history.   I counseled this couple for approximately 40 minutes regarding the above risks and available options.    Quinn PlowmanKaren Gregary Blackard, MS,  Certified Genetic Counselor  03/21/2014

## 2014-03-26 ENCOUNTER — Encounter (HOSPITAL_COMMUNITY): Payer: Self-pay

## 2014-03-26 ENCOUNTER — Ambulatory Visit (HOSPITAL_COMMUNITY)
Admission: RE | Admit: 2014-03-26 | Discharge: 2014-03-26 | Disposition: A | Discharge status: Home / Self Care | Payer: Self-pay | Source: Ambulatory Visit | Attending: Advanced Practice Midwife | Admitting: Advanced Practice Midwife

## 2014-03-26 DIAGNOSIS — R772 Abnormality of alphafetoprotein: Secondary | ICD-10-CM

## 2014-03-26 DIAGNOSIS — O350XX0 Maternal care for (suspected) central nervous system malformation in fetus, not applicable or unspecified: Secondary | ICD-10-CM | POA: Insufficient documentation

## 2014-03-26 DIAGNOSIS — O0932 Supervision of pregnancy with insufficient antenatal care, second trimester: Secondary | ICD-10-CM

## 2014-03-26 DIAGNOSIS — F17210 Nicotine dependence, cigarettes, uncomplicated: Secondary | ICD-10-CM | POA: Insufficient documentation

## 2014-03-26 DIAGNOSIS — O99332 Smoking (tobacco) complicating pregnancy, second trimester: Secondary | ICD-10-CM | POA: Insufficient documentation

## 2014-03-26 DIAGNOSIS — O289 Unspecified abnormal findings on antenatal screening of mother: Secondary | ICD-10-CM | POA: Insufficient documentation

## 2014-03-26 DIAGNOSIS — Z3A18 18 weeks gestation of pregnancy: Secondary | ICD-10-CM | POA: Insufficient documentation

## 2014-03-26 DIAGNOSIS — Z3492 Encounter for supervision of normal pregnancy, unspecified, second trimester: Secondary | ICD-10-CM

## 2014-04-01 ENCOUNTER — Encounter: Payer: Self-pay | Admitting: Physician Assistant

## 2014-04-01 ENCOUNTER — Ambulatory Visit (HOSPITAL_COMMUNITY): Payer: Self-pay

## 2014-04-14 ENCOUNTER — Encounter: Payer: Self-pay | Admitting: Family Medicine

## 2014-04-16 ENCOUNTER — Encounter: Payer: Self-pay | Admitting: Obstetrics & Gynecology

## 2014-04-18 NOTE — L&D Delivery Note (Cosign Needed)
Delivery Note During a routine check, pt was turned from her side to her back for a cervical exam.  Upon lifting the sheet, the female baby was noted to be in the bed in between her legs:  Acrocyanotic, breathing, good tone.  This was at 11:43 PM. The pt had no idea when the baby had come out, but the assumption is when the pt was repositioned. The baby was immediately placed on mother's abdomen, dried and stimulated.  ~ 5 minute APGAR 9. 40 units of pitocin diluted in 1000cc LR was infused rapidly IV.  The placenta separated spontaneously and delivered via CCT and maternal pushing effort.  It was inspected and appears to be intact with a 3 VC.    Anesthesia:  epidural Episiotomy:  none Lacerations:  1st degree labial Suture Repair: n/a Est. Blood Loss (mL):  Pending, but <150  Mom to postpartum.  Baby to Couplet care / Skin to Skin.  Graham,Judy Rosas 08/15/2014, 12:01 AM

## 2014-04-23 ENCOUNTER — Ambulatory Visit (HOSPITAL_COMMUNITY): Payer: Self-pay | Attending: Advanced Practice Midwife

## 2014-05-02 ENCOUNTER — Ambulatory Visit (HOSPITAL_COMMUNITY)
Admission: RE | Admit: 2014-05-02 | Discharge: 2014-05-02 | Disposition: A | Discharge status: Home / Self Care | Payer: Self-pay | Source: Ambulatory Visit | Attending: Advanced Practice Midwife | Admitting: Advanced Practice Midwife

## 2014-05-02 ENCOUNTER — Encounter (HOSPITAL_COMMUNITY): Payer: Self-pay

## 2014-05-02 VITALS — BP 99/59 | HR 85 | Wt 103.5 lb

## 2014-05-02 DIAGNOSIS — R772 Abnormality of alphafetoprotein: Secondary | ICD-10-CM

## 2014-05-02 DIAGNOSIS — O0932 Supervision of pregnancy with insufficient antenatal care, second trimester: Secondary | ICD-10-CM

## 2014-05-02 DIAGNOSIS — F17210 Nicotine dependence, cigarettes, uncomplicated: Secondary | ICD-10-CM | POA: Insufficient documentation

## 2014-05-02 DIAGNOSIS — Z3A23 23 weeks gestation of pregnancy: Secondary | ICD-10-CM | POA: Insufficient documentation

## 2014-05-02 DIAGNOSIS — IMO0002 Reserved for concepts with insufficient information to code with codable children: Secondary | ICD-10-CM | POA: Insufficient documentation

## 2014-05-02 DIAGNOSIS — Z3492 Encounter for supervision of normal pregnancy, unspecified, second trimester: Secondary | ICD-10-CM

## 2014-05-02 DIAGNOSIS — O289 Unspecified abnormal findings on antenatal screening of mother: Secondary | ICD-10-CM | POA: Insufficient documentation

## 2014-05-02 DIAGNOSIS — O99332 Smoking (tobacco) complicating pregnancy, second trimester: Secondary | ICD-10-CM | POA: Insufficient documentation

## 2014-05-02 DIAGNOSIS — Z36 Encounter for antenatal screening of mother: Secondary | ICD-10-CM | POA: Insufficient documentation

## 2014-05-02 DIAGNOSIS — Z0489 Encounter for examination and observation for other specified reasons: Secondary | ICD-10-CM | POA: Insufficient documentation

## 2014-05-05 ENCOUNTER — Other Ambulatory Visit (HOSPITAL_COMMUNITY): Payer: Self-pay | Admitting: Maternal and Fetal Medicine

## 2014-05-05 DIAGNOSIS — O280 Abnormal hematological finding on antenatal screening of mother: Secondary | ICD-10-CM

## 2014-05-05 DIAGNOSIS — O0930 Supervision of pregnancy with insufficient antenatal care, unspecified trimester: Secondary | ICD-10-CM

## 2014-05-05 DIAGNOSIS — O352XX1 Maternal care for (suspected) hereditary disease in fetus, fetus 1: Secondary | ICD-10-CM

## 2014-05-05 DIAGNOSIS — F17210 Nicotine dependence, cigarettes, uncomplicated: Secondary | ICD-10-CM

## 2014-05-05 DIAGNOSIS — G930 Cerebral cysts: Secondary | ICD-10-CM

## 2014-05-06 ENCOUNTER — Encounter: Payer: Self-pay | Admitting: General Practice

## 2014-05-13 ENCOUNTER — Encounter: Payer: Self-pay | Admitting: Family Medicine

## 2014-05-21 ENCOUNTER — Ambulatory Visit (INDEPENDENT_AMBULATORY_CARE_PROVIDER_SITE_OTHER): Payer: Self-pay | Admitting: Advanced Practice Midwife

## 2014-05-21 VITALS — BP 119/73 | HR 108 | Temp 97.9°F | Wt 103.6 lb

## 2014-05-21 DIAGNOSIS — O219 Vomiting of pregnancy, unspecified: Secondary | ICD-10-CM

## 2014-05-21 DIAGNOSIS — M549 Dorsalgia, unspecified: Secondary | ICD-10-CM

## 2014-05-21 DIAGNOSIS — O99891 Other specified diseases and conditions complicating pregnancy: Secondary | ICD-10-CM

## 2014-05-21 DIAGNOSIS — Z23 Encounter for immunization: Secondary | ICD-10-CM

## 2014-05-21 DIAGNOSIS — Z3492 Encounter for supervision of normal pregnancy, unspecified, second trimester: Secondary | ICD-10-CM

## 2014-05-21 DIAGNOSIS — O9989 Other specified diseases and conditions complicating pregnancy, childbirth and the puerperium: Secondary | ICD-10-CM

## 2014-05-21 DIAGNOSIS — O2342 Unspecified infection of urinary tract in pregnancy, second trimester: Secondary | ICD-10-CM

## 2014-05-21 DIAGNOSIS — O0932 Supervision of pregnancy with insufficient antenatal care, second trimester: Secondary | ICD-10-CM

## 2014-05-21 DIAGNOSIS — M419 Scoliosis, unspecified: Secondary | ICD-10-CM

## 2014-05-21 DIAGNOSIS — O26892 Other specified pregnancy related conditions, second trimester: Secondary | ICD-10-CM

## 2014-05-21 LAB — CBC
HCT: 34.4 % — ABNORMAL LOW (ref 36.0–46.0)
Hemoglobin: 11.2 g/dL — ABNORMAL LOW (ref 12.0–15.0)
MCH: 30.6 pg (ref 26.0–34.0)
MCHC: 32.6 g/dL (ref 30.0–36.0)
MCV: 94 fL (ref 78.0–100.0)
MPV: 10.5 fL (ref 8.6–12.4)
Platelets: 304 10*3/uL (ref 150–400)
RBC: 3.66 MIL/uL — ABNORMAL LOW (ref 3.87–5.11)
RDW: 13.6 % (ref 11.5–15.5)
WBC: 10.6 10*3/uL — ABNORMAL HIGH (ref 4.0–10.5)

## 2014-05-21 LAB — POCT URINALYSIS DIP (DEVICE)
BILIRUBIN URINE: NEGATIVE
Glucose, UA: NEGATIVE mg/dL
HGB URINE DIPSTICK: NEGATIVE
Ketones, ur: NEGATIVE mg/dL
Nitrite: NEGATIVE
PH: 6.5 (ref 5.0–8.0)
Protein, ur: NEGATIVE mg/dL
Specific Gravity, Urine: 1.02 (ref 1.005–1.030)
UROBILINOGEN UA: 0.2 mg/dL (ref 0.0–1.0)

## 2014-05-21 MED ORDER — IBUPROFEN 600 MG PO TABS: 600.0000 mg | ORAL_TABLET | Freq: Four times a day (QID) | ORAL | Status: DC | PRN

## 2014-05-21 MED ORDER — TETANUS-DIPHTH-ACELL PERTUSSIS 5-2.5-18.5 LF-MCG/0.5 IM SUSP
0.5000 mL | Freq: Once | INTRAMUSCULAR | Status: AC
  Administered 2014-05-21: 0.5 mL via INTRAMUSCULAR

## 2014-05-21 MED ORDER — CYCLOBENZAPRINE HCL 10 MG PO TABS: 10.0000 mg | ORAL_TABLET | Freq: Three times a day (TID) | ORAL | Status: DC | PRN

## 2014-05-21 MED ORDER — PROMETHAZINE HCL 25 MG PO TABS: 25.0000 mg | ORAL_TABLET | Freq: Four times a day (QID) | ORAL | Status: DC | PRN

## 2014-05-21 NOTE — Patient Instructions (Signed)
Preterm Birth Preterm birth is a birth that happens before 57 weeks of pregnancy. Most pregnancies last about 39-41 weeks. Every week in the womb is important and is beneficial to the health of the infant. Infants born before 45 weeks of pregnancy are at a higher risk for complications. Depending on when the infant was born, he or she may be:  Late preterm. Born between 32 weeks and 37 weeks of pregnancy.  Very preterm. Born at less than 32 weeks of pregnancy.  Extremely preterm. Born at less than 25 weeks of pregnancy. The earlier a baby is born, the more likely the child will have issues related to prematurity. Complications and problems that can be seen in infants born too early include:  Problems breathing (respiratory distress syndrome).  Low birth weight.  Problems feeding.  Sleeping problems.  Yellowing of the skin (jaundice).  Infections such as pneumonia. Babies born very preterm or extremely preterm are at risk for more serious medical issues. These include:  More severe breathing issues.  Eyesight issues.  Brain development issues (intraventricular hemorrhage).  Behavioral and emotional development issues.  Growth and developmental delays.  Cerebral palsy.  Serious feeding or bowel complications (necrotizing enterocolitis). CAUSES  There are two broad categories of preterm birth.  Spontaneous preterm birth. This is a birth resulting from preterm labor (not medically induced) or preterm premature rupture of membranes (PPROM).  Indicated preterm birth. This is a birth resulting from labor being medically induced due to health, personal, or social reasons. RISK FACTORS Preterm birth may be related to certain medical conditions, lifestyle factors, or demographic factors encountered by the mother or fetus.  Medical conditions include:  Multiple gestations (twins, triplets, and so on).  Infection.  Diabetes.  Heart disease.  Kidney disease.  Cervical or  uterine abnormalities.  Being underweight.  High blood pressure or preeclampsia.  Premature rupture of membranes (PROM).  Birth defects in the fetus.  Lifestyle factors include:  Poor prenatal care.  Poor nutrition or anemia.  Cigarette smoking.  Consuming alcohol.  High levels of stress and lack of social or emotional support.  Exposure to chemical or environmental toxins.  Substance abuse.  Demographic factors include:  African-American ethnicity.  Age (younger than 55 or older than 24 years of age).  Low socioeconomic status. Women with a history of preterm labor or who become pregnant within 50 months of giving birth are also at increased risk for preterm birth. DIAGNOSIS  Your health care provider may request additional tests to diagnose underlying complications resulting from preterm birth. Tests on the infant may include:  Physical exam.  Blood tests.  Chest X-rays.  Heart-lung monitoring. TREATMENT  After birth, special care will be taken to assess any problems or complications for the infant. Supportive care will be provided for the infant. Treatment depends on what problems are present and any complications that develop. Some preterm infants are cared for in a neonatal intensive care unit. In general, care may include:  Maintaining temperature and oxygen in a clear heated box (baby isolette).  Monitoring the infant's heart rate, breathing, and level of oxygen in the blood.  Monitoring for signs of infection and, if needed, giving IV antibiotic medicine.  Inserting a feeding tube (nose, mouth) or giving IV nutrition if unable to feed.  Inserting a breathing tube (ventilation).  Respiration support (continuous positive airway pressure [CPAP] or oxygen). Treatment will change as the infant builds up strength and is able to breathe and eat on his or her  own. For some infants, no special treatment is necessary. Parents may be educated on the potential  health risks of prematurity to the infant. HOME CARE INSTRUCTIONS  Understand your infant's special conditions and needs. It may be reassuring to learn about infant CPR.  Monitor your infant in the car seat until he or she grows and matures. Infant car seats can cause breathing difficulties for preterm infants.  Keep your infant warm. Dress your infant in layers and keep him or her away from drafts, especially in cold months of the year.  Wash your hands thoroughly after going to the bathroom or changing a diaper. Late preterm infants may be more prone to infection.  Follow all your health care provider's instructions for providing support and care to your preterm infant.  Get support from organizations and groups that understand your challenges.  Follow up with your infant's health care provider as directed. Prevention There are some things you can do to help lower your risk of having a preterm infant in the future. These include:  Good prenatal care throughout the entire pregnancy. See a health care provider regularly for advice and tests.  Management of underlying medical conditions.  Proper self-care and lifestyle changes.  Proper diet and weight control.  Watching for signs of various infections. SEEK MEDICAL CARE IF:  Your infant has feeding difficulties.  Your infant has sleeping difficulties.  Your infant has breathing difficulties.  Your infant's skin starts to look yellow.  Your infant shows signs of infection, such as a stuffy nose, fever, crying, or bluish color of the skin. FOR MORE INFORMATION [employer]: www.marchofdimes.com Prematurity.org: www.prematurity.org Document Released: 06/25/2003 Document Revised: 01/23/2013 Document Reviewed: 11/01/2012 [facility] Patient Information 2015 [place], [place]. This information is not intended to replace advice given to you by your health care provider. Make sure you discuss any questions you have with your health  care provider.  Breastfeeding Deciding to breastfeed is one of the best choices you can make for you and your baby. A change in hormones during pregnancy causes your breast tissue to grow and increases the number and size of your milk ducts. These hormones also allow proteins, sugars, and fats from your blood supply to make breast milk in your milk-producing glands. Hormones prevent breast milk from being released before your baby is born as well as prompt milk flow after birth. Once breastfeeding has begun, thoughts of your baby, as well as his or her sucking or crying, can stimulate the release of milk from your milk-producing glands.  BENEFITS OF BREASTFEEDING For Your Baby  Your first milk (colostrum) helps your baby's digestive system function better.   There are antibodies in your milk that help your baby fight off infections.   Your baby has a lower incidence of asthma, allergies, and sudden infant death syndrome.   The nutrients in breast milk are better for your baby than infant formulas and are designed uniquely for your baby's needs.   Breast milk improves your baby's brain development.   Your baby is less likely to develop other conditions, such as childhood obesity, asthma, or type 2 diabetes mellitus.  For You   Breastfeeding helps to create a very special bond between you and your baby.   Breastfeeding is convenient. Breast milk is always available at the correct temperature and costs nothing.   Breastfeeding helps to burn calories and helps you lose the weight gained during pregnancy.   Breastfeeding makes your uterus contract to its prepregnancy size faster  and slows bleeding (lochia) after you give birth.   Breastfeeding helps to lower your risk of developing type 2 diabetes mellitus, osteoporosis, and breast or ovarian cancer later in life. SIGNS THAT YOUR BABY IS HUNGRY Early Signs of Hunger  Increased alertness or activity.  Stretching.  Movement of  the head from side to side.  Movement of the head and opening of the mouth when the corner of the mouth or cheek is stroked (rooting).  Increased sucking sounds, smacking lips, cooing, sighing, or squeaking.  Hand-to-mouth movements.  Increased sucking of fingers or hands. Late Signs of Hunger  Fussing.  Intermittent crying. Extreme Signs of Hunger Signs of extreme hunger will require calming and consoling before your baby will be able to breastfeed successfully. Do not wait for the following signs of extreme hunger to occur before you initiate breastfeeding:   Restlessness.  A loud, strong cry.   Screaming. BREASTFEEDING BASICS Breastfeeding Initiation  Find a comfortable place to sit or lie down, with your neck and back well supported.  Place a pillow or rolled up blanket under your baby to bring him or her to the level of your breast (if you are seated). Nursing pillows are specially designed to help support your arms and your baby while you breastfeed.  Make sure that your baby's abdomen is facing your abdomen.   Gently massage your breast. With your fingertips, massage from your chest wall toward your nipple in a circular motion. This encourages milk flow. You may need to continue this action during the feeding if your milk flows slowly.  Support your breast with 4 fingers underneath and your thumb above your nipple. Make sure your fingers are well away from your nipple and your baby's mouth.   Stroke your baby's lips gently with your finger or nipple.   When your baby's mouth is open wide enough, quickly bring your baby to your breast, placing your entire nipple and as much of the colored area around your nipple (areola) as possible into your baby's mouth.   More areola should be visible above your baby's upper lip than below the lower lip.   Your baby's tongue should be between his or her lower gum and your breast.   Ensure that your baby's mouth is correctly  positioned around your nipple (latched). Your baby's lips should create a seal on your breast and be turned out (everted).  It is common for your baby to suck about 2-3 minutes in order to start the flow of breast milk. Latching Teaching your baby how to latch on to your breast properly is very important. An improper latch can cause nipple pain and decreased milk supply for you and poor weight gain in your baby. Also, if your baby is not latched onto your nipple properly, he or she may swallow some air during feeding. This can make your baby fussy. Burping your baby when you switch breasts during the feeding can help to get rid of the air. However, teaching your baby to latch on properly is still the best way to prevent fussiness from swallowing air while breastfeeding. Signs that your baby has successfully latched on to your nipple:    Silent tugging or silent sucking, without causing you pain.   Swallowing heard between every 3-4 sucks.    Muscle movement above and in front of his or her ears while sucking.  Signs that your baby has not successfully latched on to nipple:   Sucking sounds or smacking sounds from  your baby while breastfeeding.  Nipple pain. If you think your baby has not latched on correctly, slip your finger into the corner of your baby's mouth to break the suction and place it between your baby's gums. Attempt breastfeeding initiation again. Signs of Successful Breastfeeding Signs from your baby:   A gradual decrease in the number of sucks or complete cessation of sucking.   Falling asleep.   Relaxation of his or her body.   Retention of a small amount of milk in his or her mouth.   Letting go of your breast by himself or herself. Signs from you:  Breasts that have increased in firmness, weight, and size 1-3 hours after feeding.   Breasts that are softer immediately after breastfeeding.  Increased milk volume, as well as a change in milk consistency  and color by the fifth day of breastfeeding.   Nipples that are not sore, cracked, or bleeding. Signs That Your Pecola Leisure is Getting Enough Milk  Wetting at least 3 diapers in a 24-hour period. The urine should be clear and pale yellow by age 69 days.  At least 3 stools in a 24-hour period by age 69 days. The stool should be soft and yellow.  At least 3 stools in a 24-hour period by age 31 days. The stool should be seedy and yellow.  No loss of weight greater than 10% of birth weight during the first 44 days of age.  Average weight gain of 4-7 ounces (113-198 g) per week after age 17 days.  Consistent daily weight gain by age 69 days, without weight loss after the age of 2 weeks. After a feeding, your baby may spit up a small amount. This is common. BREASTFEEDING FREQUENCY AND DURATION Frequent feeding will help you make more milk and can prevent sore nipples and breast engorgement. Breastfeed when you feel the need to reduce the fullness of your breasts or when your baby shows signs of hunger. This is called "breastfeeding on demand." Avoid introducing a pacifier to your baby while you are working to establish breastfeeding (the first 4-6 weeks after your baby is born). After this time you may choose to use a pacifier. Research has shown that pacifier use during the first year of a baby's life decreases the risk of sudden infant death syndrome (SIDS). Allow your baby to feed on each breast as long as he or she wants. Breastfeed until your baby is finished feeding. When your baby unlatches or falls asleep while feeding from the first breast, offer the second breast. Because newborns are often sleepy in the first few weeks of life, you may need to awaken your baby to get him or her to feed. Breastfeeding times will vary from baby to baby. However, the following rules can serve as a guide to help you ensure that your baby is properly fed:  Newborns (babies 48 weeks of age or younger) may breastfeed every  1-3 hours.  Newborns should not go longer than 3 hours during the day or 5 hours during the night without breastfeeding.  You should breastfeed your baby a minimum of 8 times in a 24-hour period until you begin to introduce solid foods to your baby at around 17 months of age. BREAST MILK PUMPING Pumping and storing breast milk allows you to ensure that your baby is exclusively fed your breast milk, even at times when you are unable to breastfeed. This is especially important if you are going back to work while you are still  breastfeeding or when you are not able to be present during feedings. Your lactation consultant can give you guidelines on how long it is safe to store breast milk.  A breast pump is a machine that allows you to pump milk from your breast into a sterile bottle. The pumped breast milk can then be stored in a refrigerator or freezer. Some breast pumps are operated by hand, while others use electricity. Ask your lactation consultant which type will work best for you. Breast pumps can be purchased, but some hospitals and breastfeeding support groups lease breast pumps on a monthly basis. A lactation consultant can teach you how to hand express breast milk, if you prefer not to use a pump.  CARING FOR YOUR BREASTS WHILE YOU BREASTFEED Nipples can become dry, cracked, and sore while breastfeeding. The following recommendations can help keep your breasts moisturized and healthy:  Avoid using soap on your nipples.   Wear a supportive bra. Although not required, special nursing bras and tank tops are designed to allow access to your breasts for breastfeeding without taking off your entire bra or top. Avoid wearing underwire-style bras or extremely tight bras.  Air dry your nipples for 3-73minutes after each feeding.   Use only cotton bra pads to absorb leaked breast milk. Leaking of breast milk between feedings is normal.   Use lanolin on your nipples after breastfeeding. Lanolin  helps to maintain your skin's normal moisture barrier. If you use pure lanolin, you do not need to wash it off before feeding your baby again. Pure lanolin is not toxic to your baby. You may also hand express a few drops of breast milk and gently massage that milk into your nipples and allow the milk to air dry. In the first few weeks after giving birth, some women experience extremely full breasts (engorgement). Engorgement can make your breasts feel heavy, warm, and tender to the touch. Engorgement peaks within 3-5 days after you give birth. The following recommendations can help ease engorgement:  Completely empty your breasts while breastfeeding or pumping. You may want to start by applying warm, moist heat (in the shower or with warm water-soaked hand towels) just before feeding or pumping. This increases circulation and helps the milk flow. If your baby does not completely empty your breasts while breastfeeding, pump any extra milk after he or she is finished.  Wear a snug bra (nursing or regular) or tank top for 1-2 days to signal your body to slightly decrease milk production.  Apply ice packs to your breasts, unless this is too uncomfortable for you.  Make sure that your baby is latched on and positioned properly while breastfeeding. If engorgement persists after 48 hours of following these recommendations, contact your health care provider or a Advertising copywriter. OVERALL HEALTH CARE RECOMMENDATIONS WHILE BREASTFEEDING  Eat healthy foods. Alternate between meals and snacks, eating 3 of each per day. Because what you eat affects your breast milk, some of the foods may make your baby more irritable than usual. Avoid eating these foods if you are sure that they are negatively affecting your baby.  Drink milk, fruit juice, and water to satisfy your thirst (about 10 glasses a day).   Rest often, relax, and continue to take your prenatal vitamins to prevent fatigue, stress, and  anemia.  Continue breast self-awareness checks.  Avoid chewing and smoking tobacco.  Avoid alcohol and drug use. Some medicines that may be harmful to your baby can pass through breast milk. It is  important to ask your health care provider before taking any medicine, including all over-the-counter and prescription medicine as well as vitamin and herbal supplements. It is possible to become pregnant while breastfeeding. If birth control is desired, ask your health care provider about options that will be safe for your baby. SEEK MEDICAL CARE IF:   You feel like you want to stop breastfeeding or have become frustrated with breastfeeding.  You have painful breasts or nipples.  Your nipples are cracked or bleeding.  Your breasts are red, tender, or warm.  You have a swollen area on either breast.  You have a fever or chills.  You have nausea or vomiting.  You have drainage other than breast milk from your nipples.  Your breasts do not become full before feedings by the fifth day after you give birth.  You feel sad and depressed.  Your baby is too sleepy to eat well.  Your baby is having trouble sleeping.   Your baby is wetting less than 3 diapers in a 24-hour period.  Your baby has less than 3 stools in a 24-hour period.  Your baby's skin or the white part of his or her eyes becomes yellow.   Your baby is not gaining weight by 745 days of age. SEEK IMMEDIATE MEDICAL CARE IF:   Your baby is overly tired (lethargic) and does not want to wake up and feed.  Your baby develops an unexplained fever. Document Released: 04/04/2005 Document Revised: 04/09/2013 Document Reviewed: 09/26/2012 [facility] Patient Information 2015 [place]. This information is not intended to replace advice given to you by your health care provider. Make sure you discuss any questions you have with your health care provider.

## 2014-05-21 NOTE — Progress Notes (Signed)
Pt complains of severe back pain for the past two weeks, encouraged patient to use support belt at all times. 28 wk labs with 1 hour glucose test and Tdap vaccine

## 2014-05-21 NOTE — Progress Notes (Signed)
28 weeks labs, TDaP today. C/o severe LBP same as pain she has had w/ scoliosis, but much worse. Strongly encouraged comfort measures, PT. Pt cant afford PT. Will also refill Flexeril. May use Ibuprofen x 48 hour. Do not take in third trimester. Growth US ordered 2/2 abn AFP. Urine TOC. States she was told by back surgeon that she would not be able to have vaginal birth. Records requested. Notified anesthesia. If Harrington Rods go down to lumbar area, she may not be able to get epidural. If they stop higher, she may be able to. Send copy of records.

## 2014-05-22 LAB — GLUCOSE TOLERANCE, 1 HOUR (50G) W/O FASTING: Glucose, 1 Hour GTT: 105 mg/dL (ref 70–140)

## 2014-05-22 LAB — HIV ANTIBODY (ROUTINE TESTING W REFLEX): HIV: NONREACTIVE

## 2014-05-22 LAB — RPR

## 2014-05-24 LAB — CULTURE, OB URINE

## 2014-05-26 ENCOUNTER — Other Ambulatory Visit: Payer: Self-pay | Admitting: Advanced Practice Midwife

## 2014-05-26 DIAGNOSIS — Z22322 Carrier or suspected carrier of Methicillin resistant Staphylococcus aureus: Secondary | ICD-10-CM | POA: Insufficient documentation

## 2014-05-26 DIAGNOSIS — O2342 Unspecified infection of urinary tract in pregnancy, second trimester: Secondary | ICD-10-CM

## 2014-05-26 MED ORDER — NITROFURANTOIN MONOHYD MACRO 100 MG PO CAPS: 100.0000 mg | ORAL_CAPSULE | Freq: Two times a day (BID) | ORAL | Status: DC

## 2014-05-26 NOTE — Progress Notes (Signed)
Urine TOC pos. Needs Macrobid Rx.

## 2014-05-27 NOTE — Progress Notes (Signed)
Called Judy Graham and notified her of UTI and need to take macrobid until all capsules taken [facility] voices understanding.

## 2014-05-30 ENCOUNTER — Ambulatory Visit (HOSPITAL_COMMUNITY): Admission: RE | Admit: 2014-05-30 | Payer: Self-pay | Source: Ambulatory Visit

## 2014-06-05 ENCOUNTER — Encounter: Payer: Self-pay | Admitting: *Deleted

## 2014-06-06 ENCOUNTER — Ambulatory Visit (INDEPENDENT_AMBULATORY_CARE_PROVIDER_SITE_OTHER): Payer: Self-pay | Admitting: Obstetrics & Gynecology

## 2014-06-06 VITALS — BP 116/86 | HR 113 | Temp 97.5°F | Wt 108.7 lb

## 2014-06-06 DIAGNOSIS — O0932 Supervision of pregnancy with insufficient antenatal care, second trimester: Secondary | ICD-10-CM

## 2014-06-06 NOTE — Progress Notes (Signed)
C/o continuous lower back pain, patient reassured about this C/o of daily nose bleeds for the last few days; had normal platelet count on 05/21/14 of 304K. Will continue to monitor. Normal 1 hr GTT. No other complaints or concerns.  Routine obstetric precautions reviewed.

## 2014-06-09 LAB — POCT URINALYSIS DIP (DEVICE)
Bilirubin Urine: NEGATIVE
Glucose, UA: NEGATIVE mg/dL
Hgb urine dipstick: NEGATIVE
Ketones, ur: NEGATIVE mg/dL
Nitrite: NEGATIVE
Protein, ur: NEGATIVE mg/dL
Specific Gravity, Urine: 1.015 (ref 1.005–1.030)
Urobilinogen, UA: 0.2 mg/dL (ref 0.0–1.0)
pH: 7.5 (ref 5.0–8.0)

## 2014-06-12 ENCOUNTER — Ambulatory Visit (HOSPITAL_COMMUNITY)
Admission: RE | Admit: 2014-06-12 | Payer: Self-pay | Source: Ambulatory Visit | Attending: Maternal and Fetal Medicine | Admitting: Maternal and Fetal Medicine

## 2014-06-13 ENCOUNTER — Encounter: Payer: Self-pay | Admitting: General Practice

## 2014-06-20 ENCOUNTER — Telehealth: Payer: Self-pay | Admitting: Physician Assistant

## 2014-06-20 ENCOUNTER — Encounter: Payer: Self-pay | Admitting: Physician Assistant

## 2014-06-20 NOTE — Telephone Encounter (Signed)
Called patient left message for her to return call to clinic due to missed ob appointment. Mailing certified letter to patient.

## 2014-06-25 ENCOUNTER — Ambulatory Visit (HOSPITAL_COMMUNITY)
Admission: RE | Admit: 2014-06-25 | Discharge: 2014-06-25 | Disposition: A | Discharge status: Home / Self Care | Payer: Medicaid Other | Source: Ambulatory Visit | Attending: Family Medicine | Admitting: Family Medicine

## 2014-06-25 ENCOUNTER — Encounter (HOSPITAL_COMMUNITY): Payer: Self-pay

## 2014-06-25 VITALS — BP 129/82 | HR 124 | Wt 110.5 lb

## 2014-06-25 DIAGNOSIS — O280 Abnormal hematological finding on antenatal screening of mother: Secondary | ICD-10-CM

## 2014-06-25 DIAGNOSIS — F17210 Nicotine dependence, cigarettes, uncomplicated: Secondary | ICD-10-CM | POA: Insufficient documentation

## 2014-06-25 DIAGNOSIS — O99333 Smoking (tobacco) complicating pregnancy, third trimester: Secondary | ICD-10-CM | POA: Diagnosis present

## 2014-06-25 DIAGNOSIS — Z3A31 31 weeks gestation of pregnancy: Secondary | ICD-10-CM | POA: Insufficient documentation

## 2014-06-25 DIAGNOSIS — O352XX0 Maternal care for (suspected) hereditary disease in fetus, not applicable or unspecified: Secondary | ICD-10-CM | POA: Insufficient documentation

## 2014-06-25 DIAGNOSIS — G930 Cerebral cysts: Secondary | ICD-10-CM

## 2014-06-25 DIAGNOSIS — R772 Abnormality of alphafetoprotein: Secondary | ICD-10-CM

## 2014-06-25 DIAGNOSIS — O0930 Supervision of pregnancy with insufficient antenatal care, unspecified trimester: Secondary | ICD-10-CM

## 2014-06-25 DIAGNOSIS — O352XX1 Maternal care for (suspected) hereditary disease in fetus, fetus 1: Secondary | ICD-10-CM

## 2014-07-21 ENCOUNTER — Ambulatory Visit (INDEPENDENT_AMBULATORY_CARE_PROVIDER_SITE_OTHER): Payer: Medicaid Other | Admitting: Advanced Practice Midwife

## 2014-07-21 ENCOUNTER — Encounter: Payer: Self-pay | Admitting: Advanced Practice Midwife

## 2014-07-21 VITALS — BP 121/87 | HR 106 | Wt 114.9 lb

## 2014-07-21 DIAGNOSIS — O219 Vomiting of pregnancy, unspecified: Secondary | ICD-10-CM

## 2014-07-21 DIAGNOSIS — Z3493 Encounter for supervision of normal pregnancy, unspecified, third trimester: Secondary | ICD-10-CM

## 2014-07-21 DIAGNOSIS — M549 Dorsalgia, unspecified: Secondary | ICD-10-CM

## 2014-07-21 DIAGNOSIS — O9989 Other specified diseases and conditions complicating pregnancy, childbirth and the puerperium: Secondary | ICD-10-CM

## 2014-07-21 DIAGNOSIS — O26893 Other specified pregnancy related conditions, third trimester: Secondary | ICD-10-CM

## 2014-07-21 DIAGNOSIS — M419 Scoliosis, unspecified: Secondary | ICD-10-CM

## 2014-07-21 LAB — POCT URINALYSIS DIP (DEVICE)
Bilirubin Urine: NEGATIVE
Glucose, UA: NEGATIVE mg/dL
HGB URINE DIPSTICK: NEGATIVE
Ketones, ur: NEGATIVE mg/dL
Leukocytes, UA: NEGATIVE
NITRITE: NEGATIVE
PROTEIN: NEGATIVE mg/dL
SPECIFIC GRAVITY, URINE: 1.015 (ref 1.005–1.030)
Urobilinogen, UA: 0.2 mg/dL (ref 0.0–1.0)
pH: 6.5 (ref 5.0–8.0)

## 2014-07-21 MED ORDER — PROMETHAZINE HCL 25 MG PO TABS
25.0000 mg | ORAL_TABLET | Freq: Four times a day (QID) | ORAL | Status: DC | PRN
Start: 2014-07-21 — End: 2014-08-16

## 2014-07-21 MED ORDER — CYCLOBENZAPRINE HCL 10 MG PO TABS: 10.0000 mg | ORAL_TABLET | Freq: Three times a day (TID) | ORAL | Status: DC | PRN

## 2014-07-21 MED ORDER — PROMETHAZINE HCL 25 MG PO TABS: 25.0000 mg | ORAL_TABLET | Freq: Four times a day (QID) | ORAL | Status: DC | PRN

## 2014-07-21 NOTE — Progress Notes (Signed)
Called Dr. Arby BarretteHatchett per Sharen CounterLisa Leftwich-Kirby request- read him part of op note, send in-box message so he can review op note and tell us his reccomendation.

## 2014-07-21 NOTE — Progress Notes (Signed)
Doing well.  Good fetal movement, denies vaginal bleeding, LOF, regular contractions.  Does report daily back pain. Recommend pregnancy support belt, heat/warm bath.  Is taking Flexeril.  Reports nausea, not daily, but often.  Renewed Rx for Phenergan and Flexeril.  Records in chart from back surgery, will review with anesthesia to see if pt can get epidural in labor.

## 2014-07-21 NOTE — Progress Notes (Signed)
Baby is active but movement is different.

## 2014-07-25 ENCOUNTER — Ambulatory Visit (HOSPITAL_COMMUNITY): Admission: RE | Admit: 2014-07-25 | Payer: Medicaid Other | Source: Ambulatory Visit

## 2014-08-04 ENCOUNTER — Telehealth: Payer: Self-pay | Admitting: General Practice

## 2014-08-04 NOTE — Telephone Encounter (Signed)
Patient called and left message stating her doctor wants to her know what the anesthesiologist recommended, whether she can have an epidural or not. Per chart review patient has appt on 4/20, do not see response from anesthesia yet. Called patient stating I am returning your phone call. Told patient we have not heard from anesthesia yet but we should be able to answer that question for her when she comes in on 4/20. Patient verbalized understanding and had no other questions

## 2014-08-06 ENCOUNTER — Encounter: Payer: Medicaid Other | Admitting: Advanced Practice Midwife

## 2014-08-06 ENCOUNTER — Telehealth: Payer: Self-pay | Admitting: Advanced Practice Midwife

## 2014-08-06 NOTE — Telephone Encounter (Signed)
Called left message for patient to return call to clinic due to missing her OB follow up appointment. Mailing dnka letter.

## 2014-08-14 ENCOUNTER — Encounter (HOSPITAL_COMMUNITY): Payer: Self-pay

## 2014-08-14 ENCOUNTER — Inpatient Hospital Stay (HOSPITAL_COMMUNITY)
Admission: AD | Admit: 2014-08-14 | Discharge: 2014-08-16 | DRG: 774 | Disposition: A | Discharge status: Home / Self Care | Payer: Medicaid Other | Source: Ambulatory Visit | Attending: Obstetrics & Gynecology | Admitting: Obstetrics & Gynecology

## 2014-08-14 ENCOUNTER — Inpatient Hospital Stay (HOSPITAL_COMMUNITY): Payer: Medicaid Other | Admitting: Anesthesiology

## 2014-08-14 ENCOUNTER — Ambulatory Visit (HOSPITAL_COMMUNITY)
Admission: RE | Admit: 2014-08-14 | Discharge: 2014-08-14 | Disposition: A | Discharge status: Home / Self Care | Payer: Medicaid Other | Source: Ambulatory Visit | Attending: Obstetrics & Gynecology | Admitting: Obstetrics & Gynecology

## 2014-08-14 ENCOUNTER — Other Ambulatory Visit (HOSPITAL_COMMUNITY): Payer: Self-pay | Admitting: Maternal and Fetal Medicine

## 2014-08-14 ENCOUNTER — Encounter (HOSPITAL_COMMUNITY): Payer: Self-pay | Admitting: *Deleted

## 2014-08-14 DIAGNOSIS — R772 Abnormality of alphafetoprotein: Secondary | ICD-10-CM

## 2014-08-14 DIAGNOSIS — O1493 Unspecified pre-eclampsia, third trimester: Secondary | ICD-10-CM | POA: Diagnosis present

## 2014-08-14 DIAGNOSIS — O365990 Maternal care for other known or suspected poor fetal growth, unspecified trimester, not applicable or unspecified: Secondary | ICD-10-CM | POA: Diagnosis present

## 2014-08-14 DIAGNOSIS — Z3483 Encounter for supervision of other normal pregnancy, third trimester: Secondary | ICD-10-CM | POA: Diagnosis present

## 2014-08-14 DIAGNOSIS — O0930 Supervision of pregnancy with insufficient antenatal care, unspecified trimester: Secondary | ICD-10-CM

## 2014-08-14 DIAGNOSIS — F17210 Nicotine dependence, cigarettes, uncomplicated: Secondary | ICD-10-CM

## 2014-08-14 DIAGNOSIS — O365930 Maternal care for other known or suspected poor fetal growth, third trimester, not applicable or unspecified: Secondary | ICD-10-CM | POA: Diagnosis present

## 2014-08-14 DIAGNOSIS — Z3A38 38 weeks gestation of pregnancy: Secondary | ICD-10-CM | POA: Diagnosis present

## 2014-08-14 LAB — RAPID URINE DRUG SCREEN, HOSP PERFORMED
Amphetamines: NOT DETECTED
BARBITURATES: NOT DETECTED
Benzodiazepines: NOT DETECTED
COCAINE: NOT DETECTED
Opiates: NOT DETECTED
Tetrahydrocannabinol: POSITIVE — AB

## 2014-08-14 LAB — CBC
HCT: 35.1 % — ABNORMAL LOW (ref 36.0–46.0)
Hemoglobin: 11.9 g/dL — ABNORMAL LOW (ref 12.0–15.0)
MCH: 29.8 pg (ref 26.0–34.0)
MCHC: 33.9 g/dL (ref 30.0–36.0)
MCV: 87.8 fL (ref 78.0–100.0)
PLATELETS: 352 10*3/uL (ref 150–400)
RBC: 4 MIL/uL (ref 3.87–5.11)
RDW: 14.9 % (ref 11.5–15.5)
WBC: 14.4 10*3/uL — ABNORMAL HIGH (ref 4.0–10.5)

## 2014-08-14 LAB — TYPE AND SCREEN
ABO/RH(D): O POS
Antibody Screen: NEGATIVE

## 2014-08-14 LAB — ABO/RH: ABO/RH(D): O POS

## 2014-08-14 LAB — AMNISURE RUPTURE OF MEMBRANE (ROM) NOT AT ARMC: Amnisure ROM: NEGATIVE

## 2014-08-14 LAB — OB RESULTS CONSOLE GBS: GBS: NEGATIVE

## 2014-08-14 LAB — GROUP B STREP BY PCR: Group B strep by PCR: NEGATIVE

## 2014-08-14 LAB — MRSA PCR SCREENING: MRSA BY PCR: POSITIVE — AB

## 2014-08-14 MED ORDER — DIPHENHYDRAMINE HCL 50 MG/ML IJ SOLN: 12.5000 mg | INTRAMUSCULAR | Status: DC | PRN

## 2014-08-14 MED ORDER — LACTATED RINGERS IV SOLN
500.0000 mL | Freq: Once | INTRAVENOUS | Status: AC
  Administered 2014-08-14: 1000 mL via INTRAVENOUS

## 2014-08-14 MED ORDER — OXYCODONE-ACETAMINOPHEN 5-325 MG PO TABS: 2.0000 | ORAL_TABLET | ORAL | Status: DC | PRN

## 2014-08-14 MED ORDER — OXYTOCIN 40 UNITS IN LACTATED RINGERS INFUSION - SIMPLE MED
62.5000 mL/h | INTRAVENOUS | Status: DC
Start: 2014-08-14 — End: 2014-08-15
  Filled 2014-08-14: qty 1000

## 2014-08-14 MED ORDER — LIDOCAINE HCL (PF) 1 % IJ SOLN
30.0000 mL | INTRAMUSCULAR | Status: DC | PRN
  Filled 2014-08-14: qty 30

## 2014-08-14 MED ORDER — OXYTOCIN BOLUS FROM INFUSION: 500.0000 mL | INTRAVENOUS | Status: DC

## 2014-08-14 MED ORDER — FLEET ENEMA 7-19 GM/118ML RE ENEM: 1.0000 | ENEMA | RECTAL | Status: DC | PRN

## 2014-08-14 MED ORDER — MUPIROCIN 2 % EX OINT
1.0000 "application " | TOPICAL_OINTMENT | Freq: Two times a day (BID) | CUTANEOUS | Status: DC
  Administered 2014-08-14: 1 via NASAL
  Filled 2014-08-14: qty 22

## 2014-08-14 MED ORDER — MISOPROSTOL 25 MCG QUARTER TABLET: 25.0000 ug | ORAL_TABLET | ORAL | Status: DC

## 2014-08-14 MED ORDER — EPHEDRINE 5 MG/ML INJ
10.0000 mg | INTRAVENOUS | Status: DC | PRN
  Filled 2014-08-14: qty 2

## 2014-08-14 MED ORDER — CITRIC ACID-SODIUM CITRATE 334-500 MG/5ML PO SOLN: 30.0000 mL | ORAL | Status: DC | PRN

## 2014-08-14 MED ORDER — FENTANYL CITRATE (PF) 100 MCG/2ML IJ SOLN: 50.0000 ug | INTRAMUSCULAR | Status: DC | PRN

## 2014-08-14 MED ORDER — PHENYLEPHRINE 40 MCG/ML (10ML) SYRINGE FOR IV PUSH (FOR BLOOD PRESSURE SUPPORT)
80.0000 ug | PREFILLED_SYRINGE | INTRAVENOUS | Status: DC | PRN
  Filled 2014-08-14: qty 2

## 2014-08-14 MED ORDER — LACTATED RINGERS IV SOLN: 500.0000 mL | INTRAVENOUS | Status: DC | PRN

## 2014-08-14 MED ORDER — FENTANYL 2.5 MCG/ML BUPIVACAINE 1/10 % EPIDURAL INFUSION (WH - ANES)
14.0000 mL/h | INTRAMUSCULAR | Status: DC | PRN
  Administered 2014-08-14: 14 mL/h via EPIDURAL
  Filled 2014-08-14: qty 125

## 2014-08-14 MED ORDER — CHLORHEXIDINE GLUCONATE CLOTH 2 % EX PADS: 6.0000 | MEDICATED_PAD | Freq: Every day | CUTANEOUS | Status: DC

## 2014-08-14 MED ORDER — MISOPROSTOL 25 MCG QUARTER TABLET
25.0000 ug | ORAL_TABLET | ORAL | Status: DC | PRN
  Administered 2014-08-14: 25 ug via VAGINAL
  Filled 2014-08-14: qty 1
  Filled 2014-08-14: qty 0.25

## 2014-08-14 MED ORDER — SODIUM BICARBONATE 8.4 % IV SOLN
INTRAVENOUS | Status: DC | PRN
  Administered 2014-08-14: 6 mL via EPIDURAL

## 2014-08-14 MED ORDER — LACTATED RINGERS IV SOLN
INTRAVENOUS | Status: DC
  Administered 2014-08-14: 19:00:00 via INTRAVENOUS

## 2014-08-14 MED ORDER — TERBUTALINE SULFATE 1 MG/ML IJ SOLN: 0.2500 mg | Freq: Once | INTRAMUSCULAR | Status: AC | PRN

## 2014-08-14 MED ORDER — ONDANSETRON HCL 4 MG/2ML IJ SOLN
4.0000 mg | Freq: Four times a day (QID) | INTRAMUSCULAR | Status: DC | PRN
  Administered 2014-08-14: 4 mg via INTRAVENOUS
  Filled 2014-08-14: qty 2

## 2014-08-14 MED ORDER — LIDOCAINE HCL (PF) 1 % IJ SOLN
INTRAMUSCULAR | Status: DC | PRN
  Administered 2014-08-14 (×2): 4 mL

## 2014-08-14 MED ORDER — PHENYLEPHRINE 40 MCG/ML (10ML) SYRINGE FOR IV PUSH (FOR BLOOD PRESSURE SUPPORT)
80.0000 ug | PREFILLED_SYRINGE | INTRAVENOUS | Status: DC | PRN
  Filled 2014-08-14: qty 2
  Filled 2014-08-14: qty 20

## 2014-08-14 MED ORDER — FAMOTIDINE 20 MG PO TABS
20.0000 mg | ORAL_TABLET | Freq: Two times a day (BID) | ORAL | Status: DC | PRN
  Administered 2014-08-14: 20 mg via ORAL

## 2014-08-14 MED ORDER — OXYCODONE-ACETAMINOPHEN 5-325 MG PO TABS
1.0000 | ORAL_TABLET | ORAL | Status: DC | PRN
Start: 2014-08-14 — End: 2014-08-15
  Administered 2014-08-14 – 2014-08-15 (×2): 1 via ORAL
  Filled 2014-08-14 (×2): qty 1

## 2014-08-14 MED ORDER — ACETAMINOPHEN 325 MG PO TABS: 650.0000 mg | ORAL_TABLET | ORAL | Status: DC | PRN

## 2014-08-14 NOTE — Progress Notes (Addendum)
   Judy Graham is a 24 y.o. G3P0020 at 4219w5d  admitted for induction of labor due to Poor fetal growth.  Subjective:  Comfortable with epidural Objective: Filed Vitals:   08/14/14 1931 08/14/14 2000 08/14/14 2037 08/14/14 2101  BP: 83/46 107/78 133/94 141/98  Pulse: 99 94 104 90  Temp:      TempSrc:      Resp:  20  20  Height:      Weight:      SpO2:          FHT:  FHR: 135 bpm, variability: minimal to moderate,  accelerations:  Abscent,  decelerations:  Present during periods of tachysystole, has late decels.  Responded to IVF bolus, postitoin changes. UC:   irregular, every 1-3 minutes SVE:   Dilation: 3.5 Effacement (%): 90 Station: -2, -1 Exam by:: F. Cresenzo-Dishmon, CNM  Labs: Lab Results  Component Value Date   WBC 14.4* 08/14/2014   HGB 11.9* 08/14/2014   HCT 35.1* 08/14/2014   MCV 87.8 08/14/2014   PLT 352 08/14/2014    Assessment / Plan:  IOL for IUGR, slow progress but often tachysystole sp one cytotec. Will continue to monitor progress, and if ctx pattern slows down will start pitocin if cx is not changing Labor: latent Fetal Wellbeing:  Category I and Category II Pain Control:  Epidural Anticipated MOD:  NSVD  CRESENZO-DISHMAN,Judy Graham 08/14/2014, 9:16 PM

## 2014-08-14 NOTE — Anesthesia Procedure Notes (Signed)
Epidural Patient location during procedure: OB Start time: 08/14/2014 6:24 PM  Staffing Anesthesiologist: Mal AmabileFOSTER, Naamah Boggess Performed by: anesthesiologist   Preanesthetic Checklist Completed: patient identified, site marked, surgical consent, pre-op evaluation, timeout performed, IV checked, risks and benefits discussed and monitors and equipment checked  Epidural Patient position: sitting Prep: site prepped and draped and DuraPrep Patient monitoring: continuous pulse ox and blood pressure Approach: midline Location: L4-L5 Injection technique: LOR air  Needle:  Needle type: Tuohy  Needle gauge: 17 G Needle length: 9 cm and 9 Needle insertion depth: 5 cm cm Catheter type: closed end flexible Catheter size: 19 Gauge Catheter at skin depth: 10 cm Test dose: negative and Other  Assessment Events: blood not aspirated, injection not painful, no injection resistance, negative IV test and no paresthesia  Additional Notes Patient identified. Risks and benefits discussed including failed block, incomplete  Pain control, post dural puncture headache, nerve damage, paralysis, blood pressure Changes, nausea, vomiting, reactions to medications-both toxic and allergic and post Partum back pain. All questions were answered. Patient expressed understanding and wished to proceed. Sterile technique was used throughout procedure. Epidural site was Dressed with sterile barrier dressing. No paresthesias, signs of intravascular injection Or signs of intrathecal spread were encountered.  Patient was more comfortable after the epidural was dosed. Please see RN's note for documentation of vital signs and FHR which are stable.

## 2014-08-14 NOTE — ED Notes (Signed)
Pt reports leaking clear watery fluid since last night. Good fetal movement reported.

## 2014-08-14 NOTE — H&P (Signed)
HPI: Judy Graham is a 24 y.o. year old G4P0020 female at [redacted]w[redacted]d weeks gestation who was rent to [employer] from MFM for fetal growth restriction. She had elevated AFP adn abnormal inhibin on quad screen. Saw Genetic counseling. Declined amniocentesis, NIPS. No evidence of neural tube defect or abd wall defect on detailed [place]. Possible VSD on [place], but did not keep appt for fetal ECHO.  Limited PNC. Only came to 4 prenatal visits.   Clinic  Low Risk Prenatal Labs  Dating  9 wk [place] (9.1 on 01/16/14) Blood type: O/POS/-- (11/17 1151)  Genetic Screen 1 Screen:    AFP:   Quad: elevated AFP NIPS: Declined Antibody:NEG (11/17 1151)  Anatomic [place]  Bilateral Choroid Plexus cysts >> left one resolved, right one smaller.>> needs f/u US in 4 weeks due to abnormal AFP >> Anatomy scan complete. They recommend f/u growth in 4 weeks (scheduled 06/12/14) Rubella: 1.72 (11/17 1151)  GTT  Third trimester: 105 RPR: NON REAC (02/03 1420)   TDaP vaccine 05/21/14 HBsAg: NEGATIVE (11/17 1151)   Flu vaccine Declined HIV: NONREACTIVE (02/03 1420)   GBS  GBS:   Contraception Depo Pap:  Baby Food Breast   Circumcision Yes.  Boy - Barbara Cower   Pediatrician Undecided   Support Person Fiance    History OB History    Gravida Para Term Preterm AB TAB SAB Ectopic Multiple Living       Past Medical History  Diagnosis Date  . Scoliosis-Harrington rods   Seizure disorder per records. No meds.   Past Surgical History  Procedure Laterality Date  . Back surgery    . Knee surgery     Family History: family history includes Cancer in her maternal grandmother and mother; Diabetes in her paternal grandmother; Stroke in her paternal grandmother. Social History:  reports that she has been smoking.  She does not have any smokeless tobacco history on file. She reports that she does not drink alcohol or use illicit drugs.   Prenatal Transfer Tool  Maternal Diabetes: No Genetic Screening: Abnormal:  Results:  Elevated AFP, Other: Maternal Ultrasounds/Referrals: Abnormal:  Findings:   IUGR, Fetal Heart Anomalies, Other: Fetal Ultrasounds or other Referrals:  Referred to Materal Fetal Medicine  Maternal Substance Abuse:  Yes:  Type: Marijuana Significant Maternal Medications:  None Significant Maternal Lab Results:  Lab values include: Other:  Other Comments:  Limited PNC. Elevated AFP, abnormal inhibin, possible VSD, bital choroid plexus cysts, growth restriction. [facility] for fetal ECHO.  Review of Systems  Constitutional: Negative for fever and chills.  Eyes: Negative for blurred vision.  Gastrointestinal: Negative for abdominal pain.  Genitourinary:       Possible leaking small amount of clear fluid since last night. No VB.   Neurological: Negative for headaches.      Blood pressure 126/93, pulse 110, temperature 98.3 F (36.8 C), temperature source Oral, height  (1.6 m), last menstrual period 11/16/2013. Maternal Exam:  Uterine Assessment: Contraction strength is mild.  Contraction frequency is irregular.   Abdomen: Patient reports no abdominal tenderness. Estimated fetal weight is 5-9.   Fetal presentation: vertex  Introitus: Normal vulva. Vulva is negative for lesion.  Vagina is positive for vaginal discharge (thick, white, odorless).  Pelvis: adequate for delivery.   Cervix: Cervix evaluated by digital exam.   Small amount of thick white, odorless discharge on glove. Not C/W SROM. Membranes palpated.   Fetal Exam Fetal Monitor Review: Mode:  ultrasound.   Baseline rate: 140.  Variability: moderate (6-25 bpm).   Pattern: accelerations present, no decelerations and variable decelerations.   Questionable few mild variables  Fetal State Assessment: Category I - tracings are normal.     Physical Exam  Constitutional: She appears well-developed and well-nourished. No distress.  HENT:  Head: Normocephalic.  Mouth/Throat: Abnormal dentition. Dental caries present.   Cardiovascular: Normal rate and regular rhythm.   Respiratory: Effort normal and breath sounds normal.  GI: Soft. There is no tenderness.  Genitourinary: Vulva exhibits no lesion. Vaginal discharge (thick, white, odorless) found.  Musculoskeletal: Normal range of motion. She exhibits no edema.       Right shoulder: She exhibits deformity.       Thoracic back: She exhibits deformity (sciliosis).    Prenatal labs: ABO, Rh: O/POS/-- (11/17 1151) Antibody: NEG (11/17 1151) Rubella: 1.72 (11/17 1151) RPR: NON REAC (02/03 1420)  HBsAg: NEGATIVE (11/17 1151)  HIV: NONREACTIVE (02/03 1420)  GBS:   Pending Amnisure neg  Assessment: 1. Labor: IOL  2. Fetal Wellbeing: Category I  3. Pain Control: None 4. GBS: Pending 5. 38.5 week IUP 6. IUGR 7. Abnormal quad 8. Severe scoliosis 9. + THC in pregnancy 10. MRSA UTI this pregnancy.  Plan:  1. Admit to BS per consult with MD 2. Routine L&D orders 3. Analgesia/anesthesia PRN  4. Cytotec 5. Mau have epidural per Anesthesia 6. UDS 7. GBS PCR. 8. MRSA PCR. Contact precautions.  Dorathy KinsmanSMITH, Judy Graham 08/14/2014, 3:27 PM

## 2014-08-14 NOTE — Anesthesia Preprocedure Evaluation (Signed)
Anesthesia Evaluation  Patient identified by MRN, date of birth, ID band Patient awake    Reviewed: Allergy & Precautions, Patient's Chart, lab work & pertinent test results  Airway Mallampati: II  TM Distance: >3 FB Neck ROM: Full    Dental  (+) Poor Dentition   Pulmonary Current Smoker,  breath sounds clear to auscultation  Pulmonary exam normal       Cardiovascular negative cardio ROS  Rhythm:Regular Rate:Normal     Neuro/Psych negative neurological ROS  negative psych ROS   GI/Hepatic negative GI ROS, Neg liver ROS,   Endo/Other  negative endocrine ROS  Renal/GU negative Renal ROS  negative genitourinary   Musculoskeletal Chronic back pain   Abdominal   Peds  Hematology negative hematology ROS (+)   Anesthesia Other Findings   Reproductive/Obstetrics (+) Pregnancy                             Anesthesia Physical Anesthesia Plan  ASA: II  Anesthesia Plan: Epidural   Post-op Pain Management:    Induction:   Airway Management Planned: Natural Airway  Additional Equipment:   Intra-op Plan:   Post-operative Plan:   Informed Consent: I have reviewed the patients History and Physical, chart, labs and discussed the procedure including the risks, benefits and alternatives for the proposed anesthesia with the patient or authorized representative who has indicated his/her understanding and acceptance.     Plan Discussed with: Anesthesiologist  Anesthesia Plan Comments: (I discussed risks, benefits and alternatives of Epidural catheter placement with the patient in MFM today. Her Operative report was reviewed and she was examined. She was informed of the possible increased risk of dural punture, failed or partial block, post dural puncture headache and transient or permanent nerve damage with placement of the epidural catheter. She understands and will let the RN know she has talked  with anesthesia when she is admitted.)        Anesthesia Quick Evaluation

## 2014-08-15 ENCOUNTER — Encounter (HOSPITAL_COMMUNITY): Payer: Self-pay

## 2014-08-15 DIAGNOSIS — Z3A38 38 weeks gestation of pregnancy: Secondary | ICD-10-CM

## 2014-08-15 LAB — COMPREHENSIVE METABOLIC PANEL
ALK PHOS: 187 U/L — AB (ref 39–117)
ALT: 17 U/L (ref 0–35)
ANION GAP: 8 (ref 5–15)
AST: 41 U/L — ABNORMAL HIGH (ref 0–37)
Albumin: 3 g/dL — ABNORMAL LOW (ref 3.5–5.2)
BILIRUBIN TOTAL: 0.2 mg/dL — AB (ref 0.3–1.2)
BUN: 13 mg/dL (ref 6–23)
CO2: 23 mmol/L (ref 19–32)
Calcium: 8.1 mg/dL — ABNORMAL LOW (ref 8.4–10.5)
Chloride: 97 mmol/L (ref 96–112)
Creatinine, Ser: 0.6 mg/dL (ref 0.50–1.10)
Glucose, Bld: 79 mg/dL (ref 70–99)
POTASSIUM: 3.6 mmol/L (ref 3.5–5.1)
Sodium: 128 mmol/L — ABNORMAL LOW (ref 135–145)
Total Protein: 7.2 g/dL (ref 6.0–8.3)

## 2014-08-15 LAB — PROTEIN / CREATININE RATIO, URINE
CREATININE, URINE: 75 mg/dL
Protein Creatinine Ratio: 0.51 mg/mg{Cre} — ABNORMAL HIGH (ref 0.00–0.15)
Total Protein, Urine: 38 mg/dL

## 2014-08-15 LAB — RPR: RPR Ser Ql: NONREACTIVE

## 2014-08-15 MED ORDER — DIPHENHYDRAMINE HCL 25 MG PO CAPS: 25.0000 mg | ORAL_CAPSULE | Freq: Four times a day (QID) | ORAL | Status: DC | PRN

## 2014-08-15 MED ORDER — BISACODYL 10 MG RE SUPP: 10.0000 mg | Freq: Every day | RECTAL | Status: DC | PRN

## 2014-08-15 MED ORDER — OXYTOCIN 40 UNITS IN LACTATED RINGERS INFUSION - SIMPLE MED: 62.5000 mL/h | INTRAVENOUS | Status: DC | PRN

## 2014-08-15 MED ORDER — OXYCODONE-ACETAMINOPHEN 5-325 MG PO TABS: 1.0000 | ORAL_TABLET | ORAL | Status: DC | PRN

## 2014-08-15 MED ORDER — FLEET ENEMA 7-19 GM/118ML RE ENEM: 1.0000 | ENEMA | Freq: Every day | RECTAL | Status: DC | PRN

## 2014-08-15 MED ORDER — LANOLIN HYDROUS EX OINT: TOPICAL_OINTMENT | CUTANEOUS | Status: DC | PRN

## 2014-08-15 MED ORDER — OXYCODONE-ACETAMINOPHEN 5-325 MG PO TABS
2.0000 | ORAL_TABLET | ORAL | Status: DC | PRN
  Administered 2014-08-15 – 2014-08-16 (×7): 2 via ORAL
  Filled 2014-08-15 (×7): qty 2

## 2014-08-15 MED ORDER — BENZOCAINE-MENTHOL 20-0.5 % EX AERO
1.0000 "application " | INHALATION_SPRAY | CUTANEOUS | Status: DC | PRN
  Administered 2014-08-15: 1 via TOPICAL
  Filled 2014-08-15: qty 56

## 2014-08-15 MED ORDER — MUPIROCIN 2 % EX OINT
1.0000 "application " | TOPICAL_OINTMENT | Freq: Two times a day (BID) | CUTANEOUS | Status: DC
  Administered 2014-08-15 (×2): 1 via NASAL

## 2014-08-15 MED ORDER — TETANUS-DIPHTH-ACELL PERTUSSIS 5-2.5-18.5 LF-MCG/0.5 IM SUSP: 0.5000 mL | Freq: Once | INTRAMUSCULAR | Status: DC

## 2014-08-15 MED ORDER — ACETAMINOPHEN 325 MG PO TABS: 650.0000 mg | ORAL_TABLET | ORAL | Status: DC | PRN

## 2014-08-15 MED ORDER — ONDANSETRON HCL 4 MG/2ML IJ SOLN: 4.0000 mg | INTRAMUSCULAR | Status: DC | PRN

## 2014-08-15 MED ORDER — IBUPROFEN 600 MG PO TABS
600.0000 mg | ORAL_TABLET | Freq: Four times a day (QID) | ORAL | Status: DC
  Administered 2014-08-15 – 2014-08-16 (×5): 600 mg via ORAL
  Filled 2014-08-15 (×5): qty 1

## 2014-08-15 MED ORDER — PRENATAL MULTIVITAMIN CH
1.0000 | ORAL_TABLET | Freq: Every day | ORAL | Status: DC
  Administered 2014-08-15: 1 via ORAL
  Filled 2014-08-15: qty 1

## 2014-08-15 MED ORDER — DIBUCAINE 1 % RE OINT: 1.0000 "application " | TOPICAL_OINTMENT | RECTAL | Status: DC | PRN

## 2014-08-15 MED ORDER — SIMETHICONE 80 MG PO CHEW: 80.0000 mg | CHEWABLE_TABLET | ORAL | Status: DC | PRN

## 2014-08-15 MED ORDER — ZOLPIDEM TARTRATE 5 MG PO TABS: 5.0000 mg | ORAL_TABLET | Freq: Every evening | ORAL | Status: DC | PRN

## 2014-08-15 MED ORDER — CHLORHEXIDINE GLUCONATE CLOTH 2 % EX PADS
6.0000 | MEDICATED_PAD | Freq: Every day | CUTANEOUS | Status: DC
  Administered 2014-08-16: 6 via TOPICAL

## 2014-08-15 MED ORDER — FERROUS SULFATE 325 (65 FE) MG PO TABS
325.0000 mg | ORAL_TABLET | Freq: Two times a day (BID) | ORAL | Status: DC
  Administered 2014-08-15 – 2014-08-16 (×3): 325 mg via ORAL
  Filled 2014-08-15 (×3): qty 1

## 2014-08-15 MED ORDER — NICOTINE 14 MG/24HR TD PT24
14.0000 mg | MEDICATED_PATCH | Freq: Every day | TRANSDERMAL | Status: DC
  Administered 2014-08-15: 14 mg via TRANSDERMAL
  Filled 2014-08-15 (×2): qty 1

## 2014-08-15 MED ORDER — METHYLERGONOVINE MALEATE 0.2 MG PO TABS: 0.2000 mg | ORAL_TABLET | ORAL | Status: DC | PRN

## 2014-08-15 MED ORDER — SENNOSIDES-DOCUSATE SODIUM 8.6-50 MG PO TABS
2.0000 | ORAL_TABLET | ORAL | Status: DC
  Administered 2014-08-15: 2 via ORAL
  Filled 2014-08-15: qty 2

## 2014-08-15 MED ORDER — WITCH HAZEL-GLYCERIN EX PADS: 1.0000 "application " | MEDICATED_PAD | CUTANEOUS | Status: DC | PRN

## 2014-08-15 MED ORDER — METHYLERGONOVINE MALEATE 0.2 MG/ML IJ SOLN: 0.2000 mg | INTRAMUSCULAR | Status: DC | PRN

## 2014-08-15 MED ORDER — ONDANSETRON HCL 4 MG PO TABS: 4.0000 mg | ORAL_TABLET | ORAL | Status: DC | PRN

## 2014-08-15 MED ORDER — MEASLES, MUMPS & RUBELLA VAC ~~LOC~~ INJ
0.5000 mL | INJECTION | Freq: Once | SUBCUTANEOUS | Status: DC
  Filled 2014-08-15: qty 0.5

## 2014-08-15 NOTE — Anesthesia Postprocedure Evaluation (Signed)
  Anesthesia Post-op Note  Patient: Judy CruiseNicole Pedigo  Procedure(s) Performed: * No procedures listed *  Patient Location: Mother/Baby  Anesthesia Type:Epidural  Level of Consciousness: awake, alert , oriented and patient cooperative  Airway and Oxygen Therapy: Patient Spontanous Breathing  Post-op Pain: none  Post-op Assessment: Post-op Vital signs reviewed, Patient's Cardiovascular Status Stable, Respiratory Function Stable, Patent Airway, No headache, No backache, No residual numbness and No residual motor weakness  Post-op Vital Signs: Reviewed and stable  Last Vitals:  Filed Vitals:   08/15/14 0620  BP: 116/73  Pulse: 84  Temp: 36.9 C  Resp: 18    Complications: No apparent anesthesia complications

## 2014-08-15 NOTE — Lactation Note (Signed)
This note was copied from the chart of Judy Graham Amodio. Lactation Consultation Note In experienced BF. Will need a lot of assistance. Mom doesn't have much initiative to BF. She is tired and just wants to sleep. Baby has low blood sugars and has low temps. Baby in [place] sleeping, mom sleep. Baby feels cool, put baby on mom STS under her gown. Hand expressed small breast, tender w/colostrum noted. Everted nipples w/short shaft. Some color pigmentation differences on the nipples, doesn't really have hardly any areola.  Attempted to get baby to suck on gloved finger to stimulate to BF. Cried and bite. Wouldn't suck. Mom frustrated asking to give baby colostrum. Rubbed on gums. Noted recessed chin. May have some frenulum issues.? He wouldn't cooperate to assess mouth well.  Mom didn't want to sit up much to hand express to give baby colostrum. Slumping. Mom wants hand pump. Wasn't very attentive, maybe d/t tiredness. Express the importance of the need to be aggressive about the baby's feedings d/t low blood sugars and low birth weight.  Referred to Baby and Me Book in Breastfeeding section Pg. 22-23 for position options and Proper latch demonstration.educated about newborn behavior Mom encouraged to feed baby 8-12 times/24 hours and with feeding cues. Mom encouraged to waken baby for feeds. WH/LC brochure given w/resources, support groups and LC services. Mom encouraged to do skin-to-skin. Reported to RN and Publishing rights managernursery RN. Patient Name: Judy Graham Seymour EAVWU'JToday's Date: 08/15/2014 Reason for consult: Initial assessment   Maternal Data Has patient been taught Hand Expression?: Yes  Feeding Feeding Type: Breast Milk Length of feed: 0 min  LATCH Score/Interventions Latch: Too sleepy or reluctant, no latch achieved, no sucking elicited. Intervention(s): Skin to skin;Teach feeding cues;Waking techniques  Audible Swallowing: None Intervention(s): Skin to skin;Hand expression  Type of Nipple:  Everted at rest and after stimulation  Comfort (Breast/Nipple): Soft / non-tender     Hold (Positioning): Assistance needed to correctly position infant at breast and maintain latch. Intervention(s): Breastfeeding basics reviewed;Support Pillows;Position options;Skin to skin  LATCH Score: 5  Lactation Tools Discussed/Used     Consult Status Consult Status: Follow-up Date: 08/15/14 Follow-up type: In-patient    Charyl DancerCARVER, Mariafernanda Hendricksen G 08/15/2014, 6:44 AM

## 2014-08-15 NOTE — Lactation Note (Signed)
This note was copied from the chart of Judy Rodney Cruiseicole Jankowski. Lactation Consultation Note  Patient Name: Judy Graham ZOXWR'UToday's Date: 08/15/2014 Reason for consult: Follow-up assessment  With this mom of a term baby, SGA, weight at 12 hours of life down 8% to 5 lbs 5.4 ounces. Baby hypoglycemic, sleepy, p;oor feeder. The baby has a recessed chin. I spoke to dad, and he said he had a recessed chin at birth, and a tongue tie. Mom was set up with DEP in premie setting, and I decreased her to 21 flanges, with a good fit. She expressed about 1 mls of colostrum, and then added to this with hand expression - god technique. I reviewed part care and cleaning. Skin th skin in the NICU during tube feeding encouraged, followed by pumping at least every 2-3 hours. I told mom she could sleep no longer than 5 hours at night. NICU booklet on providing EBM for a NICU baby reviewed with mom. WIC referral sent to [place]. Mom probably will be discharged to home on Sunday, [facility] Mom knows to call for questions/concerns.    Maternal Data    Feeding    LATCH Score/Interventions                      Lactation Tools Discussed/Used WIC Program: Yes (fax sent for mom ot get DEP - [place]) Initiated by:: bedside RN Date initiated:: 08/15/14   Consult Status Consult Status: Follow-up Date: 08/16/14 Follow-up type: In-patient    Judy Graham, Judy Graham 08/15/2014, 6:04 PM

## 2014-08-15 NOTE — Anesthesia Postprocedure Evaluation (Signed)
  Anesthesia Post-op Note  Patient: Judy CruiseNicole Daws  Procedure(s) Performed: * No procedures listed *  Patient Location: Mother/Baby  Anesthesia Type:Epidural  Level of Consciousness: awake, alert  and oriented  Airway and Oxygen Therapy: Patient Spontanous Breathing  Post-op Pain: moderate  Post-op Assessment: Patient's Cardiovascular Status Stable, Respiratory Function Stable, Patent Airway, No signs of Nausea or vomiting, Adequate PO intake, No headache and No residual numbness  Post-op Vital Signs: Reviewed and stable  Last Vitals:  Filed Vitals:   08/15/14 0800  BP: 129/78  Pulse: 86  Temp: 37.1 C  Resp: 18    Complications: No apparent anesthesia complications

## 2014-08-15 NOTE — Addendum Note (Signed)
Addendum  created 08/15/14 1443 by Lincoln BrighamAngela Draughon Keighan Amezcua, CRNA   Modules edited: Notes Section   Notes Section:  File: [id number]

## 2014-08-15 NOTE — Progress Notes (Signed)
Mom given instructions from infection control about seeing the baby with her +MRSA results.  She and dad are to wash hands well and mom is to gown as soon as she leaves room.  She is to wear the gown through out the hospital and into NICU.

## 2014-08-15 NOTE — Plan of Care (Signed)
Problem: Consults Goal: Birthing Suites Patient Information Press F2 to bring up selections list  Outcome: Completed/Met Date Met:  08/15/14  Pt 37-[redacted] weeks EGA, Inpatient induction, Fetal indication and Infection-MRSA

## 2014-08-15 NOTE — Progress Notes (Addendum)
Dr Joana Reameravanzo notified via phone, of patients rapid delivery and was requested to come evaluate neonate, due to suspected abnormalities. MD asked if RR & HR were normal, and RN notified her that they were. MD stated no further evaluation needed at this time. To notify if there are any changes in neonate.

## 2014-08-15 NOTE — Progress Notes (Addendum)
Post Partum Day 1 Subjective: no complaints, up ad lib, voiding and tolerating PO, small lochia, plans to breastfeed, [facility]  Objective: Blood pressure 116/73, pulse 84, temperature 98.5 F (36.9 C), temperature source Oral, resp. rate 18, height 5\' 3"  (1.6 m), weight 53.978 kg (119 lb), last menstrual period 11/16/2013, SpO2 99 %, unknown if currently breastfeeding.  Physical Exam:  General: alert, cooperative and no distress Lochia:normal flow Chest: CTAB Heart: RRR no m/r/g Abdomen: +BS, soft, nontender,  Uterine Fundus: firm DVT Evaluation: No evidence of DVT seen on physical exam. Extremities: no edema   Recent Labs  08/14/14 1627  HGB 11.9*  HCT 35.1*    Assessment/Plan:  Dx of preeclampsia after baby delivered, normal BP now Plan for discharge tomorrow, Lactation consult and Social Work consult   LOS: 1 day   CRESENZO-DISHMAN,Judy Graham 08/15/2014, 7:04 AM

## 2014-08-15 NOTE — Progress Notes (Signed)
UR chart review completed.  

## 2014-08-15 NOTE — Clinical Social Work Maternal (Signed)
CLINICAL SOCIAL WORK MATERNAL/CHILD NOTE  Patient Details  Name: Judy Graham MRN: [id number] Date of Birth: [birth year]  Date:  08/15/2014  Clinical Social Worker Initiating Note:  Binyamin Nelis E. Brigitte Pulse, [place] Date/ Time Initiated:  08/15/14/1430     Child's Name:  Judy Graham   Legal Guardian:   (Parents: [name] and [name])   Need for Interpreter:  None   Date of Referral:  08/15/14     Reason for Referral:  Other (Comment) ("THC, Poor care, limited resources")   Referral Source:  CNM   Address:  [address] [place], [place], [place] [address]  Phone number:  [phone]   Household Members:  Siblings (FOB's 24 year old brother)   Natural Supports (not living in the home):  Extended Family   Professional Supports: None (MOB states she was seeing a therapist and psychiatrist at [facility] in [place] prior to moving to [place] a year ago)   Employment:     Type of Work:  (FOB works for a [employer] doing security system work and hopes that he will be hired on full time.  MOB states she is in the process of applying for Disability.)   Education:      Financial Resources:  Medicaid   Other Resources:      Cultural/Religious Considerations Which May Impact Care:  None stated  Strengths:  Ability to meet basic needs , Home prepared for child , Pediatrician chosen , Understanding of illness (Pediatric follow up will be at [facility])   Risk Factors/Current Problems:  [place] Concerns , Substance Use , Transportation    Cognitive State:  Able to Concentrate , Alert , Linear Thinking    Mood/Affect:  Tearful , Apprehensive , Calm  (MOB states she is in pain)   CSW Assessment: CSW met with parents in MOB's first floor room to introduce myself, offer support and complete assessment for [facility] and baby's admission to NICU today.  Parents were pleasant and welcoming.  They had visitors who gladly stepped out when CSW asked if parents  preferred talking privately.  CSW explained role in the hospital and ongoing support services offered while baby is in the NICU.  MOB reports feeling heartbroken when she learned that baby would be transferred to the NICU.  FOB provided CSW with an update on reason why baby was transferred and seems to have a good understanding of the situation at this time.  CSW discussed common emotions related to the NICU experience as well as PPD signs and symptoms.  When asking parents about working, FOB stated that he works, but MOB has never had a job.  She states she is in the process of applying for Disability due to her back issues, but missed an appointment with the psychiatrist for the application.  CSW asked why she was sent to see a psychiatrist in applying for SSI.  She replied that she had to see a psychiatrist for her back issues.  CSW did not question any further.   CSW evaluated natural supports and parents report that FOB's family is very involved and supportive.  MOB did not talk about any supports other than FOB's family.  Parents report being engaged.  They appear very supportive of each other and acted very loving towards each other, discussing things and holding hands throughout assessment.  Parents report having everything they need for baby at home.  CSW stressed the importance of safe sleep/SIDS risks and parents commit to put baby  to bed on his back in his bassinet every time they put him to sleep. CSW inquired about MOB's limited PNC, which she seemed puzzled by.  She appeared to not think that her care was limited or that she missed appointments.  FOB interjected that he had just gotten his new job and that transportation was sometimes an issue.  He states his car is "in the shop," but that it is fixed and he gets it back on Monday.  CSW offered bus passes if needed in order to get to the hospital to see baby after MOB's discharge.  They were appreciative and state they will let CSW know, as they do  utilize the bus system for transportation.  CSW asked MOB about documented marijuana use and positive screens during pregnancy.  She appeared embarrassed, as her face became red, but she admits to smoking marijuana at times.  She did not elaborate.  CSW explained hospital drug screen policy on the baby and mandated reporting to Child Protective Services.  CSW also informed parents of baby's positive UDS for marijuana.  MOB began to cry and FOB became upset, however, remained calm.  He stated, "I'm not going to get as mad as I want to right now.  They'd rather babies be positive for narcotics."  CSW explained that babies positive for narcotics not prescribed to mothers during pregnancy is also cause for concern and CPS involvement.  FOB then stated, "She could have gotten narcotics prescribed to her if she wanted to.  We wanted to do this the natural way since she was off all her psychiatric medication and pain medication while she was pregnant."  CSW asked MOB to tell CSW about her psychiatric medication and pain medication.  MOB explained that she was on Citalopram and Abilify and one other medication prior to pregnancy, but came off of them when she got pregnant.  She states she was being seen at [facility] in [place] prior to moving to [place] a year ago.  She reports having a dx of Anxiety, Depression and PTSD.  She states she took pain medication for her scoliosis, but also came off of this for pregnancy.  CSW commends her for this, and explains that although everyone has their own opinion about marijuana, we live in a state where it is an illegal substance and therefore, CSW is mandated to report it to CPS.  They stated understanding and appreciation for CSW's openness with them, but FOB stated that he is still upset.  CSW validated his feelings.  MOB asked if CPS will take her baby.  CSW explained that CSW has never had the experience where a baby was removed from his/her parents' custody for marijuana alone.    CSW stressed the importance of mental health follow-up and reviewed PPD signs and symptoms, now knowing MOB's hx.  MOB is willing to re-enter treatment now that she is settled in [place] and has had the baby.  CSW provided her with resources in the community.  FOB states his brother's girlfriend sees someone "on [address]." and that he will ask her where she goes.  CSW requests that MOB inform CSW where she plans to go for follow up to keep her accountable.  She agreed.   CSW provided parents with contact information and asked them to call any time.  They thanked CSW for talking with them.     CSW Plan/Description:  Information/Referral to [employer] , Child Copy Report , Engineer, mining , Psychosocial Support  and Ongoing Assessment of Needs    Kalman Shan 08/15/2014, 5:10 PM

## 2014-08-15 NOTE — Progress Notes (Signed)
PR/CR ration 0.51.   Filed Vitals:   08/15/14 0101  BP: 125/91  Pulse: 101  Temp:   Resp: 18   DX:  Mild preeclampsia  Plan:  Monitor BP

## 2014-08-16 ENCOUNTER — Ambulatory Visit: Payer: Self-pay

## 2014-08-16 MED ORDER — IBUPROFEN 600 MG PO TABS: 600.0000 mg | ORAL_TABLET | Freq: Four times a day (QID) | ORAL | Status: DC | PRN

## 2014-08-16 NOTE — Lactation Note (Signed)
This note was copied from the chart of Judy Rodney Cruiseicole Kaspar. Lactation Consultation Note; Mother assist with hands on pumping. She states she obtained 2-3 ml with last pumping. Reviewed NICU book on storage , collection and transportation of expressed breastmilk. Mother states she plans to [facility]. She was given paperwork packet. Mother has breastmilk labels and storage bottles. [facility].   Patient Name: Judy Graham EAVWU'JToday's Date: 08/16/2014 Reason for consult: Follow-up assessment   Maternal Data    Feeding Feeding Type: Formula Length of feed: 30 min  LATCH Score/Interventions                      Lactation Tools Discussed/Used     Consult Status Consult Status: Follow-up Date: 08/16/14 Follow-up type: In-patient    Judy Graham, Judy Graham 08/16/2014, 11:04 AM

## 2014-08-16 NOTE — Discharge Instructions (Signed)

## 2014-08-16 NOTE — Lactation Note (Signed)
This note was copied from the chart of Judy Rodney Cruiseicole Veras. Lactation Consultation Note Baby in NICU.mom is using DEBP obtaining about 3ml, encouraged to hand express after pumping to obtain higher fat and calorie milk. [facility]. Waiting on someone to bring money to rent pump. Mom understands pumping, supply and demand, cleaning, and storage of milk. Gave vials, stickers, milk labels, and some bottles to bring milk in for baby.  Discussed engorgement and prevention. Mom and dad are attentive and verbalizes instructions back. Received pump and signed paper work. Patient Name: Judy Graham RUEAV'WToday's Date: 08/16/2014 Reason for consult: Follow-up assessment;NICU baby;Pump rental   Maternal Data    Feeding Feeding Type: Breast Milk Nipple Type: Regular Length of feed: 15 min  LATCH Score/Interventions Latch: Grasps breast easily, tongue down, lips flanged, rhythmical sucking. Intervention(s): Skin to skin  Audible Swallowing: None Intervention(s): Hand expression  Type of Nipple: Everted at rest and after stimulation  Comfort (Breast/Nipple): Soft / non-tender     Hold (Positioning): Assistance needed to correctly position infant at breast and maintain latch.  LATCH Score: 7  Lactation Tools Discussed/Used Tools: Pump Breast pump type: Double-Electric Breast Pump Pump Review: Setup, frequency, and cleaning;Milk Storage Initiated by:: Judy JeffersonL. [name] RN Date initiated:: 08/16/14   Consult Status Consult Status: Complete Date: 08/16/14    Charyl DancerCARVER, Eduardo Honor G 08/16/2014, 6:21 PM

## 2014-08-16 NOTE — Discharge Summary (Signed)
Obstetric Discharge Summary Reason for Admission: induction of labor for IUGR Prenatal Procedures: none Intrapartum Procedures: spontaneous vaginal delivery Postpartum Procedures: none Complications-Operative and Postpartum: 1st degree labial HEMOGLOBIN  Date Value Ref Range Status  08/14/2014 11.9* 12.0 - 15.0 g/dL Final   HCT  Date Value Ref Range Status  08/14/2014 35.1* 36.0 - 46.0 % Final   Judy Graham is a 23yo G3P0020 admitted for IOL at 38.5wks from MFM with dx of IUGR. Cytotec was used for cx ripening. Later that evening she was going to be examined and the baby had precipitously delivered without the pt feeling anything. The infant was taken to the NICU on PPD#1 and pt reports is doing well. She is pumping. She is interested in Depo for contraception. She had a SW visit during the hospitalization due to [facility], issues re limited resources, and having had only 4 prenatal visits. By PPD#2 she is ready for discharge.  Physical Exam:  General: alert, cooperative and no distress Heart: RRR Lungs: nl effort Lochia: appropriate Uterine Fundus: firm DVT Evaluation: No evidence of DVT seen on physical exam.  Discharge Diagnoses: Term Pregnancy-delivered  Discharge Information: Date: 08/16/2014 Activity: pelvic rest Diet: routine Medications: PNV and Ibuprofen Condition: stable Instructions: refer to practice specific booklet Discharge to: home Follow-up Information    [facility] Schedule an appointment as soon as possible for a visit in 4 weeks.   Why:  For your postpartum appointment.   Contact information:   [address] [place] [id number] [phone]      Newborn Data: Live born female  Birth Weight: 5 lb 12.6 oz (2625 g) APGAR: 8, 9  Home with : NICU at present.  Cam HaiSHAW, Sintia Mckissic  CNM  08/16/2014, 7:49 AM

## 2014-08-18 ENCOUNTER — Ambulatory Visit: Payer: Self-pay

## 2014-08-18 NOTE — Lactation Note (Signed)
This note was copied from the chart of Judy Rodney Cruiseicole Favor. Lactation Consultation Note  Assisted with latching baby to breast.  Mom has full breast and short nipples.  Baby is eager to feed and rooting. Baby attempts to latch but has difficulty sustaining latch.  20 mm nipple shield used and baby latched easily and nursed actively for 25 minutes.  Breast soft after feeding and baby relaxed and content.  Mom will room in tonight.  Instructed mom to latch baby with any feeding cue and continue to post pump due to abundant supply.  Will follow up in AM for discharge instructions.  Patient Name: Judy Graham HQION'GToday's Date: 08/18/2014     Maternal Data    Feeding Feeding Type: Breast Milk Nipple Type: Slow - flow Length of feed: 25 min  LATCH Score/Interventions                      Lactation Tools Discussed/Used     Consult Status      Huston FoleyMOULDEN, Makayli Bracken S 08/18/2014, 1:57 PM

## 2014-08-18 NOTE — Lactation Note (Signed)
This note was copied from the chart of Judy Rodney Cruiseicole Rosier. Lactation Consultation Note  Follow up visit made with mom in NICU.  She is c/o of firm areas on the outer aspects of breasts.  Assisted mom with pumping.  Warm compresses and massage done prior to and during pumping session,  Mom has a good milk supply.  Mom pumped for 20 minutes and good breast softening noted.  Will follow up to assist with latch later today.  Patient Name: Judy Graham WGNFA'OToday's Date: 08/18/2014     Maternal Data    Feeding Feeding Type: Breast Milk Nipple Type: Slow - flow Length of feed: 25 min  LATCH Score/Interventions                      Lactation Tools Discussed/Used     Consult Status      Huston FoleyMOULDEN, Daneka Lantigua S 08/18/2014, 1:02 PM

## 2014-08-19 ENCOUNTER — Encounter: Payer: Self-pay | Admitting: Obstetrics & Gynecology

## 2014-08-20 ENCOUNTER — Encounter: Payer: Medicaid Other | Admitting: Advanced Practice Midwife

## 2014-09-17 ENCOUNTER — Ambulatory Visit: Payer: Medicaid Other | Admitting: Obstetrics & Gynecology

## 2014-10-26 ENCOUNTER — Emergency Department (HOSPITAL_COMMUNITY)
Admission: EM | Admit: 2014-10-26 | Discharge: 2014-10-26 | Disposition: A | Discharge status: Home / Self Care | Payer: Medicaid Other | Attending: Emergency Medicine | Admitting: Emergency Medicine

## 2014-10-26 ENCOUNTER — Encounter (HOSPITAL_COMMUNITY): Payer: Self-pay | Admitting: Emergency Medicine

## 2014-10-26 DIAGNOSIS — M5410 Radiculopathy, site unspecified: Secondary | ICD-10-CM | POA: Insufficient documentation

## 2014-10-26 DIAGNOSIS — Z720 Tobacco use: Secondary | ICD-10-CM | POA: Diagnosis not present

## 2014-10-26 DIAGNOSIS — M79606 Pain in leg, unspecified: Secondary | ICD-10-CM | POA: Diagnosis not present

## 2014-10-26 DIAGNOSIS — M545 Low back pain: Secondary | ICD-10-CM | POA: Diagnosis present

## 2014-10-26 MED ORDER — CYCLOBENZAPRINE HCL 5 MG PO TABS: 5.0000 mg | ORAL_TABLET | Freq: Three times a day (TID) | ORAL | Status: DC | PRN

## 2014-10-26 MED ORDER — PREDNISONE 10 MG PO TABS: ORAL_TABLET | ORAL | Status: DC

## 2014-10-26 MED ORDER — TRAMADOL HCL 50 MG PO TABS: 50.0000 mg | ORAL_TABLET | Freq: Four times a day (QID) | ORAL | Status: DC | PRN

## 2014-10-26 NOTE — Discharge Instructions (Signed)

## 2014-10-26 NOTE — ED Provider Notes (Signed)
CSN: [id number]     Arrival date & time 10/26/14  1019 History   First MD Initiated Contact with Patient 10/26/14 1031     Chief Complaint  Patient presents with  . Leg Pain     (Consider location/radiation/quality/duration/timing/severity/associated sxs/prior Treatment) HPI Comments: Pt comes in with c/o pain and numbness to her lower left back and her leg. She states that she has had problem in the past with a pinched nerve. Denies fever, weakness or incontinence. She had surgery previous for scoliosis. She states that he has numbness and pain together. She states that her knee is numb but everything else she can feel.  The history is provided by the patient. No language interpreter was used.    Past Medical History  Diagnosis Date  . Scoliosis    Past Surgical History  Procedure Laterality Date  . Back surgery    . Knee surgery     Family History  Problem Relation Age of Onset  . Cancer Mother   . Cancer Maternal Grandmother   . Diabetes Paternal Grandmother   . Stroke Paternal Grandmother    History  Substance Use Topics  . Smoking status: Current Every Day Smoker -- 0.50 packs/day  . Smokeless tobacco: Not on file  . Alcohol Use: No     Comment: occ   OB History    Gravida Para Term Preterm AB TAB SAB Ectopic Multiple Living   3 1 1  0 2 0 2 0 0 1     Review of Systems  All other systems reviewed and are negative.     Allergies  Clindamycin/lincomycin  Home Medications   Prior to Admission medications   Medication Sig Start Date End Date Taking? Authorizing Provider  cyclobenzaprine (FLEXERIL) 5 MG tablet Take 1 tablet (5 mg total) by mouth 3 (three) times daily as needed for muscle spasms. 10/26/14   Teressa LowerVrinda Kavish Lafitte, NP  ibuprofen (ADVIL,MOTRIN) 600 MG tablet Take 1 tablet (600 mg total) by mouth every 6 (six) hours as needed. 08/16/14   Arabella MerlesKimberly D Shaw, CNM  predniSONE (DELTASONE) 10 MG tablet 6 day step down dose 10/26/14   Teressa LowerVrinda Atley Scarboro, NP   traMADol (ULTRAM) 50 MG tablet Take 1 tablet (50 mg total) by mouth every 6 (six) hours as needed. 10/26/14   Teressa LowerVrinda Flor Whitacre, NP   BP 118/80 mmHg  Pulse 102  Temp(Src) 97.7 F (36.5 C) (Oral)  Resp 18  SpO2 96%  Breastfeeding? No Physical Exam  Constitutional: She is oriented to person, place, and time.  Pulmonary/Chest: Effort normal and breath sounds normal.  Musculoskeletal: Normal range of motion.  Lumbar paraspinal tenderness. Able to do straight leg raises  Neurological: She is alert and oriented to person, place, and time. She has normal reflexes. She exhibits normal muscle tone. Coordination normal.  Skin: Skin is warm and dry.  Psychiatric: She has a normal mood and affect.    ED Course  Procedures (including critical care time) Labs Review Labs Reviewed - No data to display  Imaging Review No results found.   EKG Interpretation None      MDM   Final diagnoses:  Radiculopathy, unspecified spinal region    Pt is okay to follow up with ortho. Will treat with flexeril, prednisone and ultram. Neurologically intact    Teressa LowerVrinda Siraj Dermody, NP 10/26/14 1054  Eber HongBrian Miller, MD 10/26/14 1550

## 2014-10-26 NOTE — ED Notes (Signed)
Pt c/o left leg pain with some numbness onset yesterday. Pt denies any known trauma. Pt able to bear some weight on leg. Pt has some sensation to leg, but sensation is limited.

## 2014-11-17 ENCOUNTER — Emergency Department (HOSPITAL_COMMUNITY)
Admission: EM | Admit: 2014-11-17 | Discharge: 2014-11-17 | Disposition: A | Discharge status: Home / Self Care | Payer: Medicaid Other | Attending: Emergency Medicine | Admitting: Emergency Medicine

## 2014-11-17 ENCOUNTER — Encounter (HOSPITAL_COMMUNITY): Payer: Self-pay | Admitting: Emergency Medicine

## 2014-11-17 DIAGNOSIS — X58XXXA Exposure to other specified factors, initial encounter: Secondary | ICD-10-CM | POA: Diagnosis not present

## 2014-11-17 DIAGNOSIS — Y999 Unspecified external cause status: Secondary | ICD-10-CM | POA: Insufficient documentation

## 2014-11-17 DIAGNOSIS — Y929 Unspecified place or not applicable: Secondary | ICD-10-CM | POA: Diagnosis not present

## 2014-11-17 DIAGNOSIS — M419 Scoliosis, unspecified: Secondary | ICD-10-CM | POA: Insufficient documentation

## 2014-11-17 DIAGNOSIS — Y939 Activity, unspecified: Secondary | ICD-10-CM | POA: Diagnosis not present

## 2014-11-17 DIAGNOSIS — Z720 Tobacco use: Secondary | ICD-10-CM | POA: Diagnosis not present

## 2014-11-17 DIAGNOSIS — S0502XA Injury of conjunctiva and corneal abrasion without foreign body, left eye, initial encounter: Secondary | ICD-10-CM | POA: Insufficient documentation

## 2014-11-17 DIAGNOSIS — S0592XA Unspecified injury of left eye and orbit, initial encounter: Secondary | ICD-10-CM | POA: Diagnosis present

## 2014-11-17 MED ORDER — HYDROCODONE-ACETAMINOPHEN 5-325 MG PO TABS: 1.0000 | ORAL_TABLET | Freq: Four times a day (QID) | ORAL | Status: DC | PRN

## 2014-11-17 MED ORDER — FLUORESCEIN SODIUM 1 MG OP STRP
1.0000 | ORAL_STRIP | Freq: Once | OPHTHALMIC | Status: AC
  Administered 2014-11-17: 1 via OPHTHALMIC
  Filled 2014-11-17: qty 1

## 2014-11-17 MED ORDER — POLYMYXIN B-TRIMETHOPRIM 10000-0.1 UNIT/ML-% OP SOLN
1.0000 [drp] | OPHTHALMIC | Status: DC
  Administered 2014-11-17: 1 [drp] via OPHTHALMIC
  Filled 2014-11-17: qty 10

## 2014-11-17 MED ORDER — TETRACAINE HCL 0.5 % OP SOLN
1.0000 [drp] | Freq: Once | OPHTHALMIC | Status: AC
  Administered 2014-11-17: 1 [drp] via OPHTHALMIC
  Filled 2014-11-17: qty 2

## 2014-11-17 NOTE — ED Provider Notes (Signed)
CSN: [id number]     Arrival date & time 11/17/14  1034 History   First MD Initiated Contact with Patient 11/17/14 1056     Chief Complaint  Patient presents with  . Eye Pain    l/eye irritatation     (Consider location/radiation/quality/duration/timing/severity/associated sxs/prior Treatment) HPI Comments: Patient presents to the emergency department with chief complaint of left eye pain. She states she has been having the pain for the past 12 hours. She reports cloudy watery discharge. She states that she has slight blurred vision. She denies any mechanism of injury. There are no aggravating or alleviating factors. She denies any fevers chills. Denies any visual field deficits.  The history is provided by the patient. No language interpreter was used.    Past Medical History  Diagnosis Date  . Scoliosis    Past Surgical History  Procedure Laterality Date  . Back surgery    . Knee surgery     Family History  Problem Relation Age of Onset  . Cancer Mother   . Cancer Maternal Grandmother   . Diabetes Paternal Grandmother   . Stroke Paternal Grandmother   . Hypertension Father    History  Substance Use Topics  . Smoking status: Current Every Day Smoker -- 0.50 packs/day    Types: Cigarettes  . Smokeless tobacco: Not on file  . Alcohol Use: No     Comment: occ   OB History    Gravida Para Term Preterm AB TAB SAB Ectopic Multiple Living   0 2 0 2 0 0 1     Review of Systems  Constitutional: Negative for fever and chills.  Eyes: Positive for pain.  Respiratory: Negative for shortness of breath.   Cardiovascular: Negative for chest pain.  Gastrointestinal: Negative for nausea, vomiting, diarrhea and constipation.  Genitourinary: Negative for dysuria.      Allergies  Clindamycin/lincomycin  Home Medications   Prior to Admission medications   Medication Sig Start Date End Date Taking? Authorizing Provider  cyclobenzaprine (FLEXERIL) 5 MG tablet Take 1  tablet (5 mg total) by mouth 3 (three) times daily as needed for muscle spasms. 10/26/14   Teressa Lower, NP  HYDROcodone-acetaminophen (NORCO/VICODIN) 5-325 MG per tablet Take 1 tablet by mouth every 6 (six) hours as needed. 11/17/14   Roxy Horseman, PA-C  ibuprofen (ADVIL,MOTRIN) 600 MG tablet Take 1 tablet (600 mg total) by mouth every 6 (six) hours as needed. 08/16/14   Arabella Merles, CNM  predniSONE (DELTASONE) 10 MG tablet 6 day step down dose 10/26/14   Teressa Lower, NP  traMADol (ULTRAM) 50 MG tablet Take 1 tablet (50 mg total) by mouth every 6 (six) hours as needed. 10/26/14   Teressa Lower, NP   BP 108/68 mmHg  Pulse 85  Temp(Src) 98 F (36.7 C) (Oral)  Resp 17  SpO2 100%  LMP 11/03/2014 (Approximate)  Breastfeeding? No Physical Exam  Constitutional: She is oriented to person, place, and time. She appears well-developed and well-nourished.  HENT:  Head: Normocephalic and atraumatic.  Eyes: Conjunctivae and EOM are normal.  2 small corneal abrasions on the left eye, no retained foreign body  Neck: Normal range of motion.  Cardiovascular: Normal rate.   Pulmonary/Chest: Effort normal.  Abdominal: She exhibits no distension.  Musculoskeletal: Normal range of motion.  Neurological: She is alert and oriented to person, place, and time.  Skin: Skin is dry.  Psychiatric: She has a normal mood and affect. Her behavior is normal. Judgment and  thought content normal.  Nursing note and vitals reviewed.   ED Course  Procedures (including critical care time) Labs Review Labs Reviewed - No data to display  Imaging Review No results found.   EKG Interpretation None      MDM   Final diagnoses:  Corneal abrasion, left, initial encounter    Patient with corneal abrasion. Will treat with Polytrim drops. We'll give pain medicine and ophthalmology follow-up. Patient understands and agrees the plan. She is stable and ready for discharge.   Roxy Horseman,  PA-C 11/17/14 1149  Rolan Bucco, MD 11/17/14 [phone]

## 2014-11-17 NOTE — Progress Notes (Signed)
Pt stating she does not have a medicaid of [place] pcp  Cm provided her with a list of the [place] providers and instructed her how to obtain a pcp for follow up care

## 2014-11-17 NOTE — ED Notes (Signed)
Pt c/o severe pain and irritation in l/eye x 12 hours. Cloudy,  watery drainage noted.  Denies trauma

## 2014-11-17 NOTE — Discharge Instructions (Signed)
Corneal Abrasion °The cornea is the clear covering at the front and center of the eye. When looking at the colored portion of the eye (iris), you are looking through the cornea. This very thin tissue is made up of many layers. The surface layer is a single layer of cells (corneal epithelium) and is one of the most sensitive tissues in the body. If a scratch or injury causes the corneal epithelium to come off, it is called a corneal abrasion. If the injury extends to the tissues below the epithelium, the condition is called a corneal ulcer. °CAUSES  °· Scratches. °· Trauma. °· Foreign body in the eye. °Some people have recurrences of abrasions in the area of the original injury even after it has healed (recurrent erosion syndrome). Recurrent erosion syndrome generally improves and goes away with time. °SYMPTOMS  °· Eye pain. °· Difficulty or inability to keep the injured eye open. °· The eye becomes very sensitive to light. °· Recurrent erosions tend to happen suddenly, first thing in the morning, usually after waking up and opening the eye. °DIAGNOSIS  °Your health care provider can diagnose a corneal abrasion during an eye exam. Dye is usually placed in the eye using a drop or a small paper strip moistened by your tears. When the eye is examined with a special light, the abrasion shows up clearly because of the dye. °TREATMENT  °· Small abrasions may be treated with antibiotic drops or ointment alone. °· A pressure patch may be put over the eye. If this is done, follow your doctor's instructions for when to remove the patch. Do not drive or use machines while the eye patch is on. Judging distances is hard to do with a patch on. °If the abrasion becomes infected and spreads to the deeper tissues of the cornea, a corneal ulcer can result. This is serious because it can cause corneal scarring. Corneal scars interfere with light passing through the cornea and cause a loss of vision in the involved eye. °HOME CARE  INSTRUCTIONS °· Use medicine or ointment as directed. Only take over-the-counter or prescription medicines for pain, discomfort, or fever as directed by your health care provider. °· Do not drive or operate machinery if your eye is patched. Your ability to judge distances is impaired. °· If your health care provider has given you a follow-up appointment, it is very important to keep that appointment. Not keeping the appointment could result in a severe eye infection or permanent loss of vision. If there is any problem keeping the appointment, let your health care provider know. °SEEK MEDICAL CARE IF:  °· You have pain, light sensitivity, and a scratchy feeling in one eye or both eyes. °· Your pressure patch keeps loosening up, and you can blink your eye under the patch after treatment. °· Any kind of discharge develops from the eye after treatment or if the lids stick together in the morning. °· You have the same symptoms in the morning as you did with the original abrasion days, weeks, or months after the abrasion healed. °MAKE SURE YOU:  °· Understand these instructions. °· Will watch your condition. °· Will get help right away if you are not doing well or get worse. °Document Released: 04/01/2000 Document Revised: 04/09/2013 Document Reviewed: 12/10/2012 °ExitCare® Patient Information ©2015 [employer]. This information is not intended to replace advice given to you by your health care provider. Make sure you discuss any questions you have with your health care provider. ° °

## 2014-12-22 ENCOUNTER — Emergency Department (HOSPITAL_COMMUNITY)
Admission: EM | Admit: 2014-12-22 | Discharge: 2014-12-22 | Disposition: A | Discharge status: Home / Self Care | Payer: Medicaid Other | Attending: Emergency Medicine | Admitting: Emergency Medicine

## 2014-12-22 ENCOUNTER — Encounter (HOSPITAL_COMMUNITY): Payer: Self-pay | Admitting: Emergency Medicine

## 2014-12-22 DIAGNOSIS — Z3202 Encounter for pregnancy test, result negative: Secondary | ICD-10-CM | POA: Insufficient documentation

## 2014-12-22 DIAGNOSIS — N921 Excessive and frequent menstruation with irregular cycle: Secondary | ICD-10-CM | POA: Diagnosis not present

## 2014-12-22 DIAGNOSIS — R1030 Lower abdominal pain, unspecified: Secondary | ICD-10-CM | POA: Diagnosis present

## 2014-12-22 DIAGNOSIS — N946 Dysmenorrhea, unspecified: Secondary | ICD-10-CM | POA: Insufficient documentation

## 2014-12-22 DIAGNOSIS — Z8739 Personal history of other diseases of the musculoskeletal system and connective tissue: Secondary | ICD-10-CM | POA: Diagnosis not present

## 2014-12-22 DIAGNOSIS — Z720 Tobacco use: Secondary | ICD-10-CM | POA: Diagnosis not present

## 2014-12-22 LAB — COMPREHENSIVE METABOLIC PANEL
ALK PHOS: 61 U/L (ref 38–126)
ALT: 20 U/L (ref 14–54)
AST: 30 U/L (ref 15–41)
Albumin: 4.3 g/dL (ref 3.5–5.0)
Anion gap: 7 (ref 5–15)
BILIRUBIN TOTAL: 0.5 mg/dL (ref 0.3–1.2)
BUN: 9 mg/dL (ref 6–20)
CALCIUM: 9.6 mg/dL (ref 8.9–10.3)
CO2: 27 mmol/L (ref 22–32)
Chloride: 105 mmol/L (ref 101–111)
Creatinine, Ser: 0.61 mg/dL (ref 0.44–1.00)
GFR calc non Af Amer: >60 mL/min (ref >60)
GLUCOSE: 98 mg/dL (ref 65–99)
Potassium: 3.6 mmol/L (ref 3.5–5.1)
SODIUM: 139 mmol/L (ref 135–145)
TOTAL PROTEIN: 8 g/dL (ref 6.5–8.1)

## 2014-12-22 LAB — CBC
HCT: 39.8 % (ref 36.0–46.0)
Hemoglobin: 13.1 g/dL (ref 12.0–15.0)
MCH: 30 pg (ref 26.0–34.0)
MCHC: 32.9 g/dL (ref 30.0–36.0)
MCV: 91.3 fL (ref 78.0–100.0)
Platelets: 421 10*3/uL — ABNORMAL HIGH (ref 150–400)
RBC: 4.36 MIL/uL (ref 3.87–5.11)
RDW: 13.2 % (ref 11.5–15.5)
WBC: 7.8 10*3/uL (ref 4.0–10.5)

## 2014-12-22 LAB — I-STAT BETA HCG BLOOD, ED (MC, WL, AP ONLY): I-stat hCG, quantitative: <5 m[IU]/mL (ref <5)

## 2014-12-22 LAB — WET PREP, GENITAL
Clue Cells Wet Prep HPF POC: NONE SEEN
Trich, Wet Prep: NONE SEEN
Yeast Wet Prep HPF POC: NONE SEEN

## 2014-12-22 LAB — LIPASE, BLOOD: Lipase: 17 U/L — ABNORMAL LOW (ref 22–51)

## 2014-12-22 MED ORDER — KETOROLAC TROMETHAMINE 60 MG/2ML IM SOLN
60.0000 mg | Freq: Once | INTRAMUSCULAR | Status: AC
  Administered 2014-12-22: 60 mg via INTRAMUSCULAR
  Filled 2014-12-22: qty 2

## 2014-12-22 MED ORDER — IBUPROFEN 600 MG PO TABS: 600.0000 mg | ORAL_TABLET | Freq: Four times a day (QID) | ORAL | Status: DC | PRN

## 2014-12-22 NOTE — ED Provider Notes (Signed)
CSN: [id number]     Arrival date & time 12/22/14  1549 History   First MD Initiated Contact with Patient 12/22/14 1941     Chief Complaint  Patient presents with  . Abdominal Pain     (Consider location/radiation/quality/duration/timing/severity/associated sxs/prior Treatment) Patient is a 24 y.o. female presenting with abdominal pain. The history is provided by the patient.  Abdominal Pain Pain location:  Suprapubic Pain quality: aching and cramping   Pain radiates to:  Does not radiate Pain severity:  Mild Onset quality:  Gradual Duration:  1 day Timing:  Constant Progression:  Unchanged Chronicity:  New Relieved by:  Nothing Worsened by:  Nothing tried Ineffective treatments:  None tried Associated symptoms: vaginal bleeding   Associated symptoms: no fever, no vaginal discharge and no vomiting   Risk factors comment:  Recent childbirth   Past Medical History  Diagnosis Date  . Scoliosis    Past Surgical History  Procedure Laterality Date  . Back surgery    . Knee surgery     Family History  Problem Relation Age of Onset  . Cancer Mother   . Cancer Maternal Grandmother   . Diabetes Paternal Grandmother   . Stroke Paternal Grandmother   . Hypertension Father    Social History  Substance Use Topics  . Smoking status: Current Every Day Smoker -- 0.50 packs/day    Types: Cigarettes  . Smokeless tobacco: None  . Alcohol Use: No     Comment: occ   OB History    Gravida Para Term Preterm AB TAB SAB Ectopic Multiple Living   3 1 1  0 2 0 2 0 0 1     Review of Systems  Constitutional: Negative for fever.  Gastrointestinal: Positive for abdominal pain. Negative for vomiting.  Genitourinary: Positive for vaginal bleeding. Negative for vaginal discharge.  All other systems reviewed and are negative.     Allergies  Clindamycin/lincomycin  Home Medications   Prior to Admission medications   Medication Sig Start Date End Date Taking? Authorizing Provider   acetaminophen (TYLENOL) 500 MG tablet Take 1,000 mg by mouth every 6 (six) hours as needed for mild pain, moderate pain or headache.   Yes Historical Provider, MD  ibuprofen (ADVIL,MOTRIN) 200 MG tablet Take 800 mg by mouth every 6 (six) hours as needed for headache, mild pain or moderate pain.   Yes Historical Provider, MD  cyclobenzaprine (FLEXERIL) 5 MG tablet Take 1 tablet (5 mg total) by mouth 3 (three) times daily as needed for muscle spasms. Patient not taking: Reported on 12/22/2014 10/26/14   Teressa Lower, NP  HYDROcodone-acetaminophen (NORCO/VICODIN) 5-325 MG per tablet Take 1 tablet by mouth every 6 (six) hours as needed. Patient not taking: Reported on 12/22/2014 11/17/14   Roxy Horseman, PA-C  ibuprofen (ADVIL,MOTRIN) 600 MG tablet Take 1 tablet (600 mg total) by mouth every 6 (six) hours as needed. Patient not taking: Reported on 12/22/2014 08/16/14   Arabella Merles, CNM  predniSONE (DELTASONE) 10 MG tablet 6 day step down dose Patient not taking: Reported on 12/22/2014 10/26/14   Teressa Lower, NP  traMADol (ULTRAM) 50 MG tablet Take 1 tablet (50 mg total) by mouth every 6 (six) hours as needed. Patient not taking: Reported on 12/22/2014 10/26/14   Teressa Lower, NP   BP 112/73 mmHg  Pulse 73  Temp(Src) 98.2 F (36.8 C) (Oral)  Resp 19  SpO2 99% Physical Exam  Constitutional: She is oriented to person, place, and time. She appears well-developed and  well-nourished. No distress.  HENT:  Head: Normocephalic.  Eyes: Conjunctivae are normal.  Neck: Neck supple. No tracheal deviation present.  Cardiovascular: Normal rate and regular rhythm.   Pulmonary/Chest: Effort normal. No respiratory distress.  Abdominal: Soft. She exhibits no distension.  Genitourinary: Cervix exhibits no motion tenderness and no discharge. Right adnexum displays no tenderness. Left adnexum displays no tenderness. There is bleeding in the vagina.  Neurological: She is alert and oriented to person, place,  and time.  Skin: Skin is warm and dry.  Psychiatric: She has a normal mood and affect.    ED Course  Procedures (including critical care time) Labs Review Labs Reviewed  LIPASE, BLOOD - Abnormal; Notable for the following:    Lipase 17 (*)    All other components within normal limits  CBC - Abnormal; Notable for the following:    Platelets 421 (*)    All other components within normal limits  WET PREP, GENITAL  COMPREHENSIVE METABOLIC PANEL  URINALYSIS, ROUTINE W REFLEX MICROSCOPIC (NOT AT [facility])  I-STAT BETA HCG BLOOD, ED ([facility], [facility], [facility] ONLY)  GC/CHLAMYDIA PROBE AMP ([place]) NOT AT [facility]    Imaging Review No results found. I have personally reviewed and evaluated these images and lab results as part of my medical decision-making.   EKG Interpretation None      MDM   Final diagnoses:  Menorrhagia with irregular cycle  Dysmenorrhea    24 year old female presents with vaginal bleeding, cramping, back pain over the course of the last 2 days. She is having her first period since her recent vaginal delivery of her child. Bleeding has been heavier than normal for her. Clinically consistent with dysmenorrhea and menorrhagia, hemoglobin stable, no signs of infection or coagulopathy. Plan to follow up with PCP as needed and return precautions discussed for worsening or new concerning symptoms.      Lyndal Pulley, MD 12/22/14 2120

## 2014-12-22 NOTE — ED Notes (Signed)
Pt c/o low abdominal pain and back apin. Pt states she hadn't had a period in 2 months until today when she began passing blood clots through her vagina. Pt states periods are typically irregular, but she did deliver her baby vaginally 4 months ago.

## 2014-12-22 NOTE — Discharge Instructions (Signed)
Dysmenorrhea Menstrual cramps (dysmenorrhea) are caused by the muscles of the uterus tightening (contracting) during a menstrual period. For some women, this discomfort is merely bothersome. For others, dysmenorrhea can be severe enough to interfere with everyday activities for a few days each month. Primary dysmenorrhea is menstrual cramps that last a couple of days when you start having menstrual periods or soon after. This often begins after a teenager starts having her period. As a woman gets older or has a baby, the cramps will usually lessen or disappear. Secondary dysmenorrhea begins later in life, lasts longer, and the pain may be stronger than primary dysmenorrhea. The pain may start before the period and last a few days after the period.  CAUSES  Dysmenorrhea is usually caused by an underlying problem, such as:  The tissue lining the uterus grows outside of the uterus in other areas of the body (endometriosis).  The endometrial tissue, which normally lines the uterus, is found in or grows into the muscular walls of the uterus (adenomyosis).  The pelvic blood vessels are engorged with blood just before the menstrual period (pelvic congestive syndrome).  Overgrowth of cells (polyps) in the lining of the uterus or cervix.  Falling down of the uterus (prolapse) because of loose or stretched ligaments.  Depression.  Bladder problems, infection, or inflammation.  Problems with the intestine, a tumor, or irritable bowel syndrome.  Cancer of the female organs or bladder.  A severely tipped uterus.  A very tight opening or closed cervix.  Noncancerous tumors of the uterus (fibroids).  Pelvic inflammatory disease (PID).  Pelvic scarring (adhesions) from a previous surgery.  Ovarian cyst.  An intrauterine device (IUD) used for birth control. RISK FACTORS You may be at greater risk of dysmenorrhea if:  You are younger than age 30.  You started puberty early.  You have  irregular or heavy bleeding.  You have never given birth.  You have a family history of this problem.  You are a smoker. SIGNS AND SYMPTOMS   Cramping or throbbing pain in your lower abdomen.  Headaches.  Lower back pain.  Nausea or vomiting.  Diarrhea.  Sweating or dizziness.  Loose stools. DIAGNOSIS  A diagnosis is based on your history, symptoms, physical exam, diagnostic tests, or procedures. Diagnostic tests or procedures may include:  Blood tests.  Ultrasonography.  An examination of the lining of the uterus (dilation and curettage, D&C).  An examination inside your abdomen or pelvis with a scope (laparoscopy).  X-rays.  CT scan.  MRI.  An examination inside the bladder with a scope (cystoscopy).  An examination inside the intestine or stomach with a scope (colonoscopy, gastroscopy). TREATMENT  Treatment depends on the cause of the dysmenorrhea. Treatment may include:  Pain medicine prescribed by your health care provider.  Birth control pills or an IUD with progesterone hormone in it.  Hormone replacement therapy.  Nonsteroidal anti-inflammatory drugs (NSAIDs). These may help stop the production of prostaglandins.  Surgery to remove adhesions, endometriosis, ovarian cyst, or fibroids.  Removal of the uterus (hysterectomy).  Progesterone shots to stop the menstrual period.  Cutting the nerves on the sacrum that go to the female organs (presacral neurectomy).  Electric current to the sacral nerves (sacral nerve stimulation).  Antidepressant medicine.  Psychiatric therapy, counseling, or group therapy.  Exercise and physical therapy.  Meditation and yoga therapy.  Acupuncture. HOME CARE INSTRUCTIONS   Only take over-the-counter or prescription medicines as directed by your health care provider.  Place a heating pad   or hot water bottle on your lower back or abdomen. Do not sleep with the heating pad.  Use aerobic exercises, walking,  swimming, biking, and other exercises to help lessen the cramping.  Massage to the lower back or abdomen may help.  Stop smoking.  Avoid alcohol and caffeine. SEEK MEDICAL CARE IF:   Your pain does not get better with medicine.  You have pain with sexual intercourse.  Your pain increases and is not controlled with medicines.  You have abnormal vaginal bleeding with your period.  You develop nausea or vomiting with your period that is not controlled with medicine. SEEK IMMEDIATE MEDICAL CARE IF:  You pass out.  Document Released: 04/04/2005 Document Revised: 12/05/2012 Document Reviewed: 09/20/2012 ExitCare Patient Information 2015 [employer]. This information is not intended to replace advice given to you by your health care provider. Make sure you discuss any questions you have with your health care provider. Abnormal Uterine Bleeding Abnormal uterine bleeding can affect women at various stages in life, including teenagers, women in their reproductive years, pregnant women, and women who have reached menopause. Several kinds of uterine bleeding are considered abnormal, including:  Bleeding or spotting between periods.   Bleeding after sexual intercourse.   Bleeding that is heavier or more than normal.   Periods that last longer than usual.  Bleeding after menopause.  Many cases of abnormal uterine bleeding are minor and simple to treat, while others are more serious. Any type of abnormal bleeding should be evaluated by your health care provider. Treatment will depend on the cause of the bleeding. HOME CARE INSTRUCTIONS Monitor your condition for any changes. The following actions may help to alleviate any discomfort you are experiencing:  Avoid the use of tampons and douches as directed by your health care provider.  Change your pads frequently. You should get regular pelvic exams and Pap tests. Keep all follow-up appointments for diagnostic tests as directed by  your health care provider.  SEEK MEDICAL CARE IF:   Your bleeding lasts more than 1 week.   You feel dizzy at times.  SEEK IMMEDIATE MEDICAL CARE IF:   You pass out.   You are changing pads every 15 to 30 minutes.   You have abdominal pain.  You have a fever.   You become sweaty or weak.   You are passing large blood clots from the vagina.   You start to feel nauseous and vomit. MAKE SURE YOU:   Understand these instructions.  Will watch your condition.  Will get help right away if you are not doing well or get worse. Document Released: 04/04/2005 Document Revised: 04/09/2013 Document Reviewed: 11/01/2012 ExitCare Patient Information 2015 [employer]. This information is not intended to replace advice given to you by your health care provider. Make sure you discuss any questions you have with your health care provider.  

## 2014-12-23 LAB — GC/CHLAMYDIA PROBE AMP (~~LOC~~) NOT AT ARMC
CHLAMYDIA, DNA PROBE: NEGATIVE
Neisseria Gonorrhea: NEGATIVE

## 2015-04-17 ENCOUNTER — Ambulatory Visit (INDEPENDENT_AMBULATORY_CARE_PROVIDER_SITE_OTHER): Payer: Medicaid Other | Admitting: Nurse Practitioner

## 2015-04-17 VITALS — BP 105/74 | HR 94 | Temp 96.8°F | Ht 63.0 in | Wt 100.0 lb

## 2015-04-17 DIAGNOSIS — N939 Abnormal uterine and vaginal bleeding, unspecified: Secondary | ICD-10-CM | POA: Diagnosis not present

## 2015-04-17 DIAGNOSIS — Z3201 Encounter for pregnancy test, result positive: Secondary | ICD-10-CM | POA: Diagnosis not present

## 2015-04-17 LAB — POCT URINE PREGNANCY: Preg Test, Ur: POSITIVE — AB

## 2015-04-17 NOTE — Patient Instructions (Signed)
Miscarriage  A miscarriage is the sudden loss of an unborn baby (fetus) before the 20th week of pregnancy. Most miscarriages happen in the first 3 months of pregnancy. Sometimes, it happens before a woman even knows she is pregnant. A miscarriage is also called a "spontaneous miscarriage" or "early pregnancy loss." Having a miscarriage can be an emotional experience. Talk with your caregiver about any questions you may have about miscarrying, the grieving process, and your future pregnancy plans.  CAUSES    Problems with the fetal chromosomes that make it impossible for the baby to develop normally. Problems with the baby's genes or chromosomes are most often the result of errors that occur, by chance, as the embryo divides and grows. The problems are not inherited from the parents.   Infection of the cervix or uterus.    Hormone problems.    Problems with the cervix, such as having an incompetent cervix. This is when the tissue in the cervix is not strong enough to hold the pregnancy.    Problems with the uterus, such as an abnormally shaped uterus, uterine fibroids, or congenital abnormalities.    Certain medical conditions.    Smoking, drinking alcohol, or taking illegal drugs.    Trauma.   Often, the cause of a miscarriage is unknown.   SYMPTOMS    Vaginal bleeding or spotting, with or without cramps or pain.   Pain or cramping in the abdomen or lower back.   Passing fluid, tissue, or blood clots from the vagina.  DIAGNOSIS   Your caregiver will perform a physical exam. You may also have an ultrasound to confirm the miscarriage. Blood or urine tests may also be ordered.  TREATMENT    Sometimes, treatment is not necessary if you naturally pass all the fetal tissue that was in the uterus. If some of the fetus or placenta remains in the body (incomplete miscarriage), tissue left behind may become infected and must be removed. Usually, a dilation and curettage (D and C) procedure is performed.  During a D and C procedure, the cervix is widened (dilated) and any remaining fetal or placental tissue is gently removed from the uterus.   Antibiotic medicines are prescribed if there is an infection. Other medicines may be given to reduce the size of the uterus (contract) if there is a lot of bleeding.   If you have Rh negative blood and your baby was Rh positive, you will need a Rh immunoglobulin shot. This shot will protect any future baby from having Rh blood problems in future pregnancies.  HOME CARE INSTRUCTIONS    Your caregiver may order bed rest or may allow you to continue light activity. Resume activity as directed by your caregiver.   Have someone help with home and family responsibilities during this time.    Keep track of the number of sanitary pads you use each day and how soaked (saturated) they are. Write down this information.    Do not use tampons. Do not douche or have sexual intercourse until approved by your caregiver.    Only take over-the-counter or prescription medicines for pain or discomfort as directed by your caregiver.    Do not take aspirin. Aspirin can cause bleeding.    Keep all follow-up appointments with your caregiver.    If you or your partner have problems with grieving, talk to your caregiver or seek counseling to help cope with the pregnancy loss. Allow enough time to grieve before trying to get pregnant again.     SEEK IMMEDIATE MEDICAL CARE IF:    You have severe cramps or pain in your back or abdomen.   You have a fever.   You pass large blood clots (walnut-sized or larger) ortissue from your vagina. Save any tissue for your caregiver to inspect.    Your bleeding increases.    You have a thick, bad-smelling vaginal discharge.   You become lightheaded, weak, or you faint.    You have chills.   MAKE SURE YOU:   Understand these instructions.   Will watch your condition.   Will get help right away if you are not doing well or get worse.     This  information is not intended to replace advice given to you by your health care provider. Make sure you discuss any questions you have with your health care provider.     Document Released: 09/28/2000 Document Revised: 07/30/2012 Document Reviewed: 05/24/2011  Elsevier Interactive Patient Education 2016 [employer]

## 2015-04-17 NOTE — Progress Notes (Signed)
   Subjective:    Patient ID: Judy Graham, female    DOB: 06-Jul-1990, 24 y.o.   MRN: [id number]  HPI  Patient comes in c/o bleeding and cramping- her last menustral period was last month but not sure of date- she did pregnancy which was positive and woke up this morning bleeding and cramping. She has had several miscarriages and 1 birth.   Review of Systems  Constitutional: Negative.   HENT: Negative.   Respiratory: Negative.   Cardiovascular: Negative.   Gastrointestinal: Positive for abdominal pain (cramping).  Genitourinary: Positive for vaginal bleeding.  Musculoskeletal: Negative.   Neurological: Negative.   Psychiatric/Behavioral: Negative.   All other systems reviewed and are negative.      Objective:   Physical Exam  Constitutional: She is oriented to person, place, and time. She appears well-developed and well-nourished.  Cardiovascular: Normal rate, regular rhythm and normal heart sounds.   Pulmonary/Chest: Effort normal and breath sounds normal.  Abdominal: Soft. There is tenderness (diffuse bil lower quadrant tenderness.).  Neurological: She is alert and oriented to person, place, and time.  Skin: Skin is warm.  Psychiatric: She has a normal mood and affect. Her behavior is normal. Judgment and thought content normal.   BP 105/74 mmHg  Pulse 94  Temp(Src) 96.8 F (36 C) (Oral)  Ht 5\' 3"  (1.6 m)  Wt 100 lb (45.36 kg)  BMI 17.72 kg/m2  LMP 11/03/2014 (Approximate)  Results for orders placed or performed in visit on 04/17/15  POCT urine pregnancy  Result Value Ref Range   Preg Test, Ur Positive (A) Negative         Assessment & Plan:   1. Vaginal bleeding   2. Positive pregnancy test    Explained to patient that this is not an emergency fro me to call EMS for transport She needs to be seen today on ER_ patient told to go home and if cannot find ride to ER then call 911 and they will transport her then. Patient told not to wait to long to go to  ER.   Mary-Margaret Daphine DeutscherMartin, FNP

## 2015-06-05 DIAGNOSIS — F319 Bipolar disorder, unspecified: Secondary | ICD-10-CM | POA: Insufficient documentation

## 2016-06-17 DIAGNOSIS — R51 Headache: Secondary | ICD-10-CM

## 2016-06-17 DIAGNOSIS — G8929 Other chronic pain: Secondary | ICD-10-CM | POA: Insufficient documentation

## 2016-06-17 DIAGNOSIS — R519 Headache, unspecified: Secondary | ICD-10-CM | POA: Insufficient documentation

## 2016-07-01 DIAGNOSIS — R109 Unspecified abdominal pain: Secondary | ICD-10-CM | POA: Insufficient documentation

## 2016-12-25 ENCOUNTER — Emergency Department (HOSPITAL_COMMUNITY): Admission: EM | Admit: 2016-12-25 | Discharge: 2016-12-25 | Discharge status: Against Medical Advice | Payer: Self-pay

## 2016-12-25 NOTE — ED Notes (Addendum)
Called pt back to triage and she stated "im leaving were actually going to [place] to do it now."

## 2017-03-30 DIAGNOSIS — M5441 Lumbago with sciatica, right side: Secondary | ICD-10-CM

## 2017-03-30 DIAGNOSIS — G8929 Other chronic pain: Secondary | ICD-10-CM | POA: Insufficient documentation

## 2017-04-28 DIAGNOSIS — B182 Chronic viral hepatitis C: Secondary | ICD-10-CM | POA: Insufficient documentation

## 2017-04-30 ENCOUNTER — Emergency Department (HOSPITAL_COMMUNITY)
Admission: EM | Admit: 2017-04-30 | Discharge: 2017-05-01 | Disposition: A | Discharge status: Home / Self Care | Payer: Medicaid Other | Attending: Emergency Medicine | Admitting: Emergency Medicine

## 2017-04-30 ENCOUNTER — Other Ambulatory Visit: Payer: Self-pay

## 2017-04-30 ENCOUNTER — Encounter (HOSPITAL_COMMUNITY): Payer: Self-pay | Admitting: *Deleted

## 2017-04-30 DIAGNOSIS — F329 Major depressive disorder, single episode, unspecified: Secondary | ICD-10-CM | POA: Insufficient documentation

## 2017-04-30 DIAGNOSIS — F17210 Nicotine dependence, cigarettes, uncomplicated: Secondary | ICD-10-CM | POA: Diagnosis not present

## 2017-04-30 DIAGNOSIS — R45851 Suicidal ideations: Secondary | ICD-10-CM | POA: Insufficient documentation

## 2017-04-30 MED ORDER — GENERIC EXTERNAL MEDICATION
Status: DC
Start: ? — End: 2017-04-30

## 2017-04-30 NOTE — ED Notes (Signed)
wanded by security 

## 2017-04-30 NOTE — ED Triage Notes (Signed)
Pt brought in by [facility] for c/o suicidal ideation; pt states "I can't take it anymore"  Ems reports law enforcement has been out to the house numerous times today; pt has been informed of the process and she could be here for a few hours; pt requesting to use the phone and pt has been given the phone for 5 minutes

## 2017-04-30 NOTE — ED Provider Notes (Signed)
[facility] EMERGENCY DEPARTMENT Provider Note   CSN: [id number] Arrival date & time: 04/30/17  2249     History   Chief Complaint Chief Complaint  Patient presents with  . V70.1    HPI Judy Graham is a 27 y.o. female.  Patient presents to the emergency department for evaluation of possible suicidal ideation.  Patient reports that [facility].  Since being home she has had multiple arguments with her husband.  She reports that he has said "very mean things".  This caused her to become distraught.  She tells me that her husband told her that she needed to leave the house.  She had no vertigo so she called 911 and told him that she was suicidal, she reports that she is not suicidal or homicidal, simply told EMS that so they would take her out of the house.      Past Medical History:  Diagnosis Date  . Scoliosis     Patient Active Problem List   Diagnosis Date Noted  . IUGR (intrauterine growth restriction) affecting care of mother 08/14/2014  . Cigarette smoker   . MRSA (methicillin resistant staph aureus) culture positive 05/26/2014  . Abnormal findings on antenatal screening   . Elevated AFP   . Choroid plexus cyst of fetus   . Abnormal MSAFP (maternal serum alpha-fetoprotein), elevated   . High risk pregnancy with high inhibin   . Choroid plexus cyst   . UTI (urinary tract infection) in pregnancy in second trimester 03/08/2014  . Marijuana use 03/08/2014  . Abnormal quad screen 03/08/2014  . Late prenatal care affecting pregnancy in second trimester, antepartum 03/04/2014  . Scoliosis 03/04/2014    Past Surgical History:  Procedure Laterality Date  . BACK SURGERY    . KNEE SURGERY      OB History    Gravida Para Term Preterm AB Living   4 1 1  0 2 1   SAB TAB Ectopic Multiple Live Births   2 0 0 0 1       Home Medications    Prior to Admission medications   Medication Sig Start Date End Date Taking?  Authorizing Provider  acetaminophen (TYLENOL) 500 MG tablet Take 1,000 mg by mouth every 6 (six) hours as needed for mild pain, moderate pain or headache.    [provider]  cyclobenzaprine (FLEXERIL) 5 MG tablet Take 1 tablet (5 mg total) by mouth 3 (three) times daily as needed for muscle spasms. Patient not taking: Reported on 12/22/2014 10/26/14   Teressa LowerPickering, Vrinda, NP  HYDROcodone-acetaminophen (NORCO/VICODIN) 5-325 MG per tablet Take 1 tablet by mouth every 6 (six) hours as needed. Patient not taking: Reported on 12/22/2014 11/17/14   Roxy HorsemanBrowning, Robert, PA-C  ibuprofen (ADVIL,MOTRIN) 600 MG tablet Take 1 tablet (600 mg total) by mouth every 6 (six) hours as needed. 12/22/14   Lyndal PulleyKnott, Daniel, MD  predniSONE (DELTASONE) 10 MG tablet 6 day step down dose Patient not taking: Reported on 12/22/2014 10/26/14   Teressa LowerPickering, Vrinda, NP  traMADol (ULTRAM) 50 MG tablet Take 1 tablet (50 mg total) by mouth every 6 (six) hours as needed. Patient not taking: Reported on 12/22/2014 10/26/14   Teressa LowerPickering, Vrinda, NP    Family History Family History  Problem Relation Age of Onset  . Cancer Mother   . Cancer Maternal Grandmother   . Diabetes Paternal Grandmother   . Stroke Paternal Grandmother   . Hypertension Father     Social History Social  History   Tobacco Use  . Smoking status: Current Every Day Smoker    Packs/day: 0.50    Types: Cigarettes  . Smokeless tobacco: Never Used  Substance Use Topics  . Alcohol use: Yes    Comment: occ  . Drug use: No     Allergies   Clindamycin/lincomycin   Review of Systems Review of Systems  Psychiatric/Behavioral: Positive for dysphoric mood.  All other systems reviewed and are negative.    Physical Exam Updated Vital Signs Ht 5\' 3"  (1.6 m)   Wt 36.3 kg (80 lb)   LMP 03/18/2017   Breastfeeding? Unknown   BMI 14.17 kg/m   Physical Exam  Constitutional: She is oriented to person, place, and time. No distress.  HENT:  Head: Normocephalic and  atraumatic.  Right Ear: Hearing normal.  Left Ear: Hearing normal.  Nose: Nose normal.  Mouth/Throat: Oropharynx is clear and moist and mucous membranes are normal.  Eyes: Conjunctivae and EOM are normal. Pupils are equal, round, and reactive to light.  Neck: Normal range of motion. Neck supple.  Cardiovascular: Regular rhythm, S1 normal and S2 normal. Exam reveals no gallop and no friction rub.  No murmur heard. Pulmonary/Chest: Effort normal and breath sounds normal. No respiratory distress. She exhibits no tenderness.  Abdominal: Soft. Normal appearance and bowel sounds are normal. There is no hepatosplenomegaly. There is no tenderness. There is no rebound, no guarding, no tenderness at McBurney's point and negative Murphy's sign. No hernia.  Musculoskeletal: Normal range of motion.  Neurological: She is alert and oriented to person, place, and time. She has normal strength. No cranial nerve deficit or sensory deficit. Coordination normal. GCS eye subscore is 4. GCS verbal subscore is 5. GCS motor subscore is 6.  Skin: Skin is warm, dry and intact. No rash noted. No cyanosis.  Psychiatric: She has a normal mood and affect. Her speech is normal and behavior is normal. Thought content normal.  Nursing note and vitals reviewed.    ED Treatments / Results  Labs (all labs ordered are listed, but only abnormal results are displayed) Labs Reviewed  CBC  COMPREHENSIVE METABOLIC PANEL  ETHANOL  RAPID URINE DRUG SCREEN, HOSP PERFORMED  I-STAT BETA HCG BLOOD, ED ([facility], [facility], [facility] ONLY)    EKG  EKG Interpretation None       Radiology No results found.  Procedures Procedures (including critical care time)  Medications Ordered in ED Medications  zolpidem (AMBIEN) tablet 5 mg (not administered)  acetaminophen (TYLENOL) tablet 650 mg (not administered)     Initial Impression / Assessment and Plan / ED Course  I have reviewed the triage vital signs and the nursing notes.  Pertinent  labs & imaging results that were available during my care of the patient were reviewed by me and considered in my medical decision making (see chart for details).     Patient presents to the emergency department as a psychiatric evaluation.  Patient had told law enforcement that she wanted to harm herself earlier tonight.  At arrival to the ER, however, she reports that she no longer wants to harm herself.  She has been evaluated by TTS and they recommend overnight observation, repeat evaluation in the morning.  Final Clinical Impressions(s) / ED Diagnoses   Final diagnoses:  Suicidal ideation    ED Discharge Orders    None       Arye Weyenberg, Canary Brim, MD 05/01/17 0021

## 2017-05-01 DIAGNOSIS — Z630 Problems in relationship with spouse or partner: Secondary | ICD-10-CM

## 2017-05-01 DIAGNOSIS — F17210 Nicotine dependence, cigarettes, uncomplicated: Secondary | ICD-10-CM

## 2017-05-01 DIAGNOSIS — F1210 Cannabis abuse, uncomplicated: Secondary | ICD-10-CM

## 2017-05-01 LAB — CBC
HEMATOCRIT: 32 % — AB (ref 36.0–46.0)
HEMOGLOBIN: 9.8 g/dL — AB (ref 12.0–15.0)
MCH: 27 pg (ref 26.0–34.0)
MCHC: 30.6 g/dL (ref 30.0–36.0)
MCV: 88.2 fL (ref 78.0–100.0)
Platelets: 235 10*3/uL (ref 150–400)
RBC: 3.63 MIL/uL — AB (ref 3.87–5.11)
RDW: 14.7 % (ref 11.5–15.5)
WBC: 4.2 10*3/uL (ref 4.0–10.5)

## 2017-05-01 LAB — I-STAT BETA HCG BLOOD, ED (MC, WL, AP ONLY): I-stat hCG, quantitative: <5 m[IU]/mL (ref <5)

## 2017-05-01 LAB — COMPREHENSIVE METABOLIC PANEL
ALBUMIN: 4 g/dL (ref 3.5–5.0)
ALK PHOS: 59 U/L (ref 38–126)
ALT: 22 U/L (ref 14–54)
AST: 32 U/L (ref 15–41)
Anion gap: 8 (ref 5–15)
BILIRUBIN TOTAL: 0.3 mg/dL (ref 0.3–1.2)
CALCIUM: 8.9 mg/dL (ref 8.9–10.3)
CO2: 27 mmol/L (ref 22–32)
CREATININE: 0.57 mg/dL (ref 0.44–1.00)
Chloride: 109 mmol/L (ref 101–111)
GFR calc Af Amer: >60 mL/min (ref >60)
GFR calc non Af Amer: >60 mL/min (ref >60)
GLUCOSE: 85 mg/dL (ref 65–99)
POTASSIUM: 3.3 mmol/L — AB (ref 3.5–5.1)
Sodium: 144 mmol/L (ref 135–145)
TOTAL PROTEIN: 7.3 g/dL (ref 6.5–8.1)

## 2017-05-01 LAB — ETHANOL: Alcohol, Ethyl (B): <10 mg/dL (ref <10)

## 2017-05-01 MED ORDER — NICOTINE 14 MG/24HR TD PT24: 14.0000 mg | MEDICATED_PATCH | Freq: Once | TRANSDERMAL | Status: DC

## 2017-05-01 MED ORDER — ZOLPIDEM TARTRATE 5 MG PO TABS
5.0000 mg | ORAL_TABLET | Freq: Every evening | ORAL | Status: DC | PRN
  Administered 2017-05-01: 5 mg via ORAL
  Filled 2017-05-01: qty 1

## 2017-05-01 MED ORDER — ACETAMINOPHEN 325 MG PO TABS: 650.0000 mg | ORAL_TABLET | ORAL | Status: DC | PRN
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
## 2017-05-01 NOTE — Discharge Instructions (Signed)
Follow-up with community mental health resources °

## 2017-05-01 NOTE — ED Notes (Signed)
Pt wanted a snack and was taken something to drink along with some crackers and peanut butter. Pt then asked if it was gluten free to which I replied that it most likely wasn't. Pt then stated she wasn't going to eat it because she "didn't want to end up in the hospital again with a heart attack".

## 2017-05-01 NOTE — ED Provider Notes (Signed)
No suicidal or homicidal ideation.  No psychosis.  Patient has been cleared by behavioral health for discharge.   Donnetta Hutchingook, Ily Denno, MD 05/01/17 1003

## 2017-05-01 NOTE — ED Notes (Signed)
TTS in progress 

## 2017-05-01 NOTE — ED Notes (Signed)
Writer received [facility] and [facility]. DR. Adriana Simasook made aware.
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
## 2017-05-01 NOTE — ED Notes (Signed)
Patient asleep at this time.

## 2017-05-01 NOTE — Progress Notes (Signed)
Per Hillery Jacksanika Lewis, NP, the patient does not meet criteria for inpatient treatment. The patient is recommended for discharge and to follow up with an outpatient provider.    The patient is psychiatrically cleared. The patient [facility], Tanika Lewis,NP. Patient's chart reviewed and consulted with Dr. Lucianne MussKumar on 05/01/2017.    Unk LightningElizabeth Wallace, RN and ED Provider notified.    Baldo DaubJolan Nijah Orlich, MSW, LCSWA Clinical Social Worker (Disposition) [facility]  [phone]

## 2017-05-08 NOTE — Progress Notes (Deleted)
Subjective: ZO:XWRUEAVWU care, Hospital follow up HPI: Judy Graham is a 27 y.o. female presenting to clinic today for:  Patient underwent EGD with biopsy earlier this month for concern for celiac disease.  She was admitted for elevated troponin and concern for N STEMI.  Additionally, she was noting to have chronic daily headaches only relieved by Fioricet.  She was started on a TCA given a limited supply of Fioricet use as needed.  She was referred to neurology for further evaluation. ***  Past Medical History:  Diagnosis Date  . Scoliosis    Past Surgical History:  Procedure Laterality Date  . BACK SURGERY    . KNEE SURGERY     Social History   Socioeconomic History  . Marital status: Single    Spouse name: Not on file  . Number of children: Not on file  . Years of education: Not on file  . Highest education level: Not on file  Social Needs  . Financial resource strain: Not on file  . Food insecurity - worry: Not on file  . Food insecurity - inability: Not on file  . Transportation needs - medical: Not on file  . Transportation needs - non-medical: Not on file  Occupational History  . Not on file  Tobacco Use  . Smoking status: Current Every Day Smoker    Packs/day: 0.50    Types: Cigarettes  . Smokeless tobacco: Never Used  Substance and Sexual Activity  . Alcohol use: Yes    Comment: occ  . Drug use: No  . Sexual activity: Yes    Birth control/protection: None  Other Topics Concern  . Not on file  Social History Narrative  . Not on file   No outpatient medications have been marked as taking for the 05/09/17 encounter (Appointment) with Raliegh Ip, DO.   Family History  Problem Relation Age of Onset  . Cancer Mother   . Cancer Maternal Grandmother   . Diabetes Paternal Grandmother   . Stroke Paternal Grandmother   . Hypertension Father    Allergies  Allergen Reactions  . Clindamycin/Lincomycin Anaphylaxis and Swelling  . Gluten Meal      Health Maintenance: ***  Flu Vaccine: {YES/NO/WILD JWJXB:14782}  Tdap Vaccine: {YES/NO/WILD NFAOZ:30865}  - every 15yrs - (<3 lifetime doses or unknown): all wounds -- look up need for Tetanus IG - (>=3 lifetime doses): clean/minor wound if >57yrs from previous; all other wounds if >12yrs from previous Zoster Vaccine: {YES/NO/WILD CARDS:18581} (those >50yo, once) Pneumonia Vaccine: {YES/NO/WILD HQION:62952} (those w/ risk factors) - (<10yr) Both: Immunocompromised, cochlear implant, CSF leak, asplenic, sickle cell, Chronic Renal Failure - (<64yr) PPSV-23 only: Heart dz, lung disease, DM, tobacco abuse, alcoholism, cirrhosis/liver disease. - (>64yr): PPSV13 then PPSV23 in 6-12mths;  - (>70yr): repeat PPSV23 once if pt received prior to 27yo and 103yrs have passed  ROS: Per HPI  Objective: Office vital signs reviewed. There were no vitals taken for this visit.  Physical Examination:  General: Awake, alert, *** nourished, No acute distress HEENT: Normal    Neck: No masses palpated. No lymphadenopathy    Ears: Tympanic membranes intact, normal light reflex, no erythema, no bulging    Eyes: PERRLA, extraocular movement in tact, sclera ***    Nose: nasal turbinates moist, *** nasal discharge    Throat: moist mucus membranes, no erythema, *** tonsillar exudate.  Airway is patent Cardio: regular rate and rhythm, S1S2 heard, no murmurs appreciated Pulm: clear to auscultation bilaterally, no wheezes, rhonchi or rales;  normal work of breathing on room air GI: soft, non-tender, non-distended, bowel sounds present x4, no hepatomegaly, no splenomegaly, no masses GU: external vaginal tissue ***, cervix ***, *** punctate lesions on cervix appreciated, *** discharge from cervical os, *** bleeding, *** cervical motion tenderness, *** abdominal/ adnexal masses Extremities: warm, well perfused, No edema, cyanosis or clubbing; +*** pulses bilaterally MSK: *** gait and *** station Skin: dry; intact; no  rashes or lesions Neuro: *** Strength and light touch sensation grossly intact, *** DTRs ***/4  Assessment/ Plan: 27 y.o. female   No problem-specific Assessment & Plan notes found for this encounter.   Raliegh IpAshly M Naba Sneed, DO [facility] [phone]

## 2017-05-09 ENCOUNTER — Ambulatory Visit: Payer: Self-pay | Admitting: Family Medicine

## 2017-05-09 ENCOUNTER — Encounter: Payer: Self-pay | Admitting: Family Medicine

## 2017-06-20 ENCOUNTER — Ambulatory Visit: Payer: Medicaid Other | Admitting: Family

## 2017-06-24 ENCOUNTER — Emergency Department (HOSPITAL_COMMUNITY)
Admission: EM | Admit: 2017-06-24 | Discharge: 2017-06-24 | Disposition: A | Discharge status: Home / Self Care | Payer: Medicaid Other | Source: Home / Self Care | Attending: Emergency Medicine | Admitting: Emergency Medicine

## 2017-06-24 ENCOUNTER — Other Ambulatory Visit: Payer: Self-pay

## 2017-06-24 ENCOUNTER — Encounter (HOSPITAL_COMMUNITY): Payer: Self-pay | Admitting: Emergency Medicine

## 2017-06-24 DIAGNOSIS — F329 Major depressive disorder, single episode, unspecified: Secondary | ICD-10-CM

## 2017-06-24 DIAGNOSIS — F17210 Nicotine dependence, cigarettes, uncomplicated: Secondary | ICD-10-CM | POA: Insufficient documentation

## 2017-06-24 DIAGNOSIS — M545 Low back pain: Secondary | ICD-10-CM | POA: Insufficient documentation

## 2017-06-24 DIAGNOSIS — F32A Depression, unspecified: Secondary | ICD-10-CM

## 2017-06-24 LAB — COMPREHENSIVE METABOLIC PANEL
ALBUMIN: 4.1 g/dL (ref 3.5–5.0)
ALT: 21 U/L (ref 14–54)
ANION GAP: 9 (ref 5–15)
AST: 35 U/L (ref 15–41)
Alkaline Phosphatase: 66 U/L (ref 38–126)
BUN: 9 mg/dL (ref 6–20)
CO2: 25 mmol/L (ref 22–32)
Calcium: 8.9 mg/dL (ref 8.9–10.3)
Chloride: 108 mmol/L (ref 101–111)
Creatinine, Ser: 0.52 mg/dL (ref 0.44–1.00)
GFR calc Af Amer: >60 mL/min (ref >60)
GFR calc non Af Amer: >60 mL/min (ref >60)
GLUCOSE: 91 mg/dL (ref 65–99)
POTASSIUM: 3.4 mmol/L — AB (ref 3.5–5.1)
SODIUM: 142 mmol/L (ref 135–145)
Total Bilirubin: 0.4 mg/dL (ref 0.3–1.2)
Total Protein: 7.8 g/dL (ref 6.5–8.1)

## 2017-06-24 LAB — CBC WITH DIFFERENTIAL/PLATELET
BASOS ABS: 0 10*3/uL (ref 0.0–0.1)
BASOS PCT: 1 %
EOS ABS: 0.3 10*3/uL (ref 0.0–0.7)
Eosinophils Relative: 4 %
HCT: 35.2 % — ABNORMAL LOW (ref 36.0–46.0)
Hemoglobin: 10.9 g/dL — ABNORMAL LOW (ref 12.0–15.0)
Lymphocytes Relative: 37 %
Lymphs Abs: 2.3 10*3/uL (ref 0.7–4.0)
MCH: 26.3 pg (ref 26.0–34.0)
MCHC: 31 g/dL (ref 30.0–36.0)
MCV: 84.8 fL (ref 78.0–100.0)
MONOS PCT: 4 %
Monocytes Absolute: 0.3 10*3/uL (ref 0.1–1.0)
NEUTROS ABS: 3.5 10*3/uL (ref 1.7–7.7)
NEUTROS PCT: 54 %
Platelets: 300 10*3/uL (ref 150–400)
RBC: 4.15 MIL/uL (ref 3.87–5.11)
RDW: 15.2 % (ref 11.5–15.5)
WBC: 6.3 10*3/uL (ref 4.0–10.5)

## 2017-06-24 LAB — ETHANOL: Alcohol, Ethyl (B): <10 mg/dL (ref <10)

## 2017-06-24 LAB — RAPID URINE DRUG SCREEN, HOSP PERFORMED
Amphetamines: NOT DETECTED
BARBITURATES: NOT DETECTED
BENZODIAZEPINES: POSITIVE — AB
COCAINE: NOT DETECTED
Opiates: NOT DETECTED
TETRAHYDROCANNABINOL: POSITIVE — AB

## 2017-06-24 MED ORDER — HYDROMORPHONE HCL 1 MG/ML IJ SOLN
1.0000 mg | Freq: Once | INTRAMUSCULAR | Status: AC
  Administered 2017-06-24: 1 mg via INTRAMUSCULAR
  Filled 2017-06-24: qty 1

## 2017-06-24 MED ORDER — KETOROLAC TROMETHAMINE 60 MG/2ML IM SOLN
60.0000 mg | Freq: Once | INTRAMUSCULAR | Status: AC
  Administered 2017-06-24: 60 mg via INTRAMUSCULAR
  Filled 2017-06-24: qty 2
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
## 2017-06-24 NOTE — ED Notes (Signed)
Patient has scoliosis and has had previous back surgery 12 years ago. Plans to schedule to have another surgery to help improve the curvature and pain. At this time patient takes percocet and valium normally to alleviate the pain. Last took both medications on Thurs. 06/22/2017 in the evening.

## 2017-06-24 NOTE — ED Notes (Signed)
Patient states her father is coming to pick her up and transport her home due to narcotic medication administration.

## 2017-06-24 NOTE — Discharge Instructions (Addendum)
See your Physician for recheck on Monday.   Your primary care doctor will need to manage your pain medications until you go to pain management.

## 2017-06-24 NOTE — ED Triage Notes (Signed)
Patient from home via EMS. Patient complains of back pain worsening over the last 4 days. Patient states pain is worse in lower. States history of scoliosis and previous back surgery.

## 2017-06-24 NOTE — ED Notes (Signed)
Called to patient's room by call light. Patient requesting this nurse to give her something for pain now.

## 2017-06-24 NOTE — ED Notes (Signed)
Pt signed no harm contract, pt advised that [facility] always takes her places for her, RN called [facility] at pt's request and relayed information and request for transport to dispatcher.

## 2017-06-24 NOTE — ED Notes (Signed)
Pt states that she wants to go home, Judy BraunKaren PA notified and at bedside with pt along with this RN, [facility] requesting to go home and advised that he would need to speak with pt again and also speak with pa, tele psych moved to pt's room,

## 2017-06-24 NOTE — ED Notes (Signed)
Pt's belongings labeled with pt stickers and put in labeled locker.
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
## 2017-06-25 ENCOUNTER — Inpatient Hospital Stay (HOSPITAL_COMMUNITY): Payer: Medicaid Other

## 2017-06-25 ENCOUNTER — Other Ambulatory Visit: Payer: Self-pay

## 2017-06-25 ENCOUNTER — Encounter (HOSPITAL_COMMUNITY): Payer: Self-pay | Admitting: Emergency Medicine

## 2017-06-25 ENCOUNTER — Inpatient Hospital Stay (HOSPITAL_COMMUNITY)
Admission: EM | Admit: 2017-06-25 | Discharge: 2017-07-02 | DRG: 917 | Disposition: A | Discharge status: Other Acute Inpatient Hospital | Payer: Medicaid Other | Attending: Internal Medicine | Admitting: Internal Medicine

## 2017-06-25 ENCOUNTER — Emergency Department (HOSPITAL_COMMUNITY): Payer: Medicaid Other

## 2017-06-25 DIAGNOSIS — R109 Unspecified abdominal pain: Secondary | ICD-10-CM

## 2017-06-25 DIAGNOSIS — M419 Scoliosis, unspecified: Secondary | ICD-10-CM | POA: Diagnosis present

## 2017-06-25 DIAGNOSIS — G92 Toxic encephalopathy: Secondary | ICD-10-CM | POA: Diagnosis present

## 2017-06-25 DIAGNOSIS — G8929 Other chronic pain: Secondary | ICD-10-CM | POA: Diagnosis present

## 2017-06-25 DIAGNOSIS — T43012A Poisoning by tricyclic antidepressants, intentional self-harm, initial encounter: Secondary | ICD-10-CM | POA: Diagnosis not present

## 2017-06-25 DIAGNOSIS — M549 Dorsalgia, unspecified: Secondary | ICD-10-CM | POA: Diagnosis not present

## 2017-06-25 DIAGNOSIS — Y92009 Unspecified place in unspecified non-institutional (private) residence as the place of occurrence of the external cause: Secondary | ICD-10-CM

## 2017-06-25 DIAGNOSIS — F1320 Sedative, hypnotic or anxiolytic dependence, uncomplicated: Secondary | ICD-10-CM

## 2017-06-25 DIAGNOSIS — B182 Chronic viral hepatitis C: Secondary | ICD-10-CM | POA: Diagnosis not present

## 2017-06-25 DIAGNOSIS — F419 Anxiety disorder, unspecified: Secondary | ICD-10-CM | POA: Diagnosis not present

## 2017-06-25 DIAGNOSIS — J9600 Acute respiratory failure, unspecified whether with hypoxia or hypercapnia: Secondary | ICD-10-CM | POA: Diagnosis present

## 2017-06-25 DIAGNOSIS — F339 Major depressive disorder, recurrent, unspecified: Secondary | ICD-10-CM

## 2017-06-25 DIAGNOSIS — Z833 Family history of diabetes mellitus: Secondary | ICD-10-CM | POA: Diagnosis not present

## 2017-06-25 DIAGNOSIS — F3131 Bipolar disorder, current episode depressed, mild: Secondary | ICD-10-CM | POA: Diagnosis not present

## 2017-06-25 DIAGNOSIS — R450 Nervousness: Secondary | ICD-10-CM | POA: Diagnosis not present

## 2017-06-25 DIAGNOSIS — F1910 Other psychoactive substance abuse, uncomplicated: Secondary | ICD-10-CM | POA: Diagnosis present

## 2017-06-25 DIAGNOSIS — Z681 Body mass index (BMI) 19 or less, adult: Secondary | ICD-10-CM

## 2017-06-25 DIAGNOSIS — F17210 Nicotine dependence, cigarettes, uncomplicated: Secondary | ICD-10-CM | POA: Diagnosis not present

## 2017-06-25 DIAGNOSIS — G4700 Insomnia, unspecified: Secondary | ICD-10-CM | POA: Diagnosis not present

## 2017-06-25 DIAGNOSIS — Z781 Physical restraint status: Secondary | ICD-10-CM

## 2017-06-25 DIAGNOSIS — Z818 Family history of other mental and behavioral disorders: Secondary | ICD-10-CM

## 2017-06-25 DIAGNOSIS — F1120 Opioid dependence, uncomplicated: Secondary | ICD-10-CM | POA: Diagnosis not present

## 2017-06-25 DIAGNOSIS — T1491XA Suicide attempt, initial encounter: Secondary | ICD-10-CM

## 2017-06-25 DIAGNOSIS — Z8249 Family history of ischemic heart disease and other diseases of the circulatory system: Secondary | ICD-10-CM | POA: Diagnosis not present

## 2017-06-25 DIAGNOSIS — F22 Delusional disorders: Secondary | ICD-10-CM | POA: Diagnosis present

## 2017-06-25 DIAGNOSIS — J181 Lobar pneumonia, unspecified organism: Secondary | ICD-10-CM | POA: Diagnosis present

## 2017-06-25 DIAGNOSIS — Z79899 Other long term (current) drug therapy: Secondary | ICD-10-CM | POA: Diagnosis not present

## 2017-06-25 DIAGNOSIS — J9601 Acute respiratory failure with hypoxia: Secondary | ICD-10-CM | POA: Diagnosis present

## 2017-06-25 DIAGNOSIS — I452 Bifascicular block: Secondary | ICD-10-CM | POA: Diagnosis present

## 2017-06-25 DIAGNOSIS — G43909 Migraine, unspecified, not intractable, without status migrainosus: Secondary | ICD-10-CM | POA: Diagnosis not present

## 2017-06-25 DIAGNOSIS — Z91018 Allergy to other foods: Secondary | ICD-10-CM

## 2017-06-25 DIAGNOSIS — Z809 Family history of malignant neoplasm, unspecified: Secondary | ICD-10-CM

## 2017-06-25 DIAGNOSIS — M2550 Pain in unspecified joint: Secondary | ICD-10-CM | POA: Diagnosis not present

## 2017-06-25 DIAGNOSIS — B850 Pediculosis due to Pediculus humanus capitis: Secondary | ICD-10-CM | POA: Diagnosis present

## 2017-06-25 DIAGNOSIS — E876 Hypokalemia: Secondary | ICD-10-CM | POA: Diagnosis not present

## 2017-06-25 DIAGNOSIS — K219 Gastro-esophageal reflux disease without esophagitis: Secondary | ICD-10-CM | POA: Diagnosis not present

## 2017-06-25 DIAGNOSIS — T43592A Poisoning by other antipsychotics and neuroleptics, intentional self-harm, initial encounter: Secondary | ICD-10-CM | POA: Diagnosis present

## 2017-06-25 DIAGNOSIS — F319 Bipolar disorder, unspecified: Secondary | ICD-10-CM | POA: Diagnosis present

## 2017-06-25 DIAGNOSIS — T50902A Poisoning by unspecified drugs, medicaments and biological substances, intentional self-harm, initial encounter: Secondary | ICD-10-CM | POA: Diagnosis not present

## 2017-06-25 DIAGNOSIS — Z981 Arthrodesis status: Secondary | ICD-10-CM

## 2017-06-25 DIAGNOSIS — F32A Depression, unspecified: Secondary | ICD-10-CM

## 2017-06-25 DIAGNOSIS — F1110 Opioid abuse, uncomplicated: Secondary | ICD-10-CM | POA: Diagnosis present

## 2017-06-25 DIAGNOSIS — Z9119 Patient's noncompliance with other medical treatment and regimen: Secondary | ICD-10-CM

## 2017-06-25 DIAGNOSIS — Z881 Allergy status to other antibiotic agents status: Secondary | ICD-10-CM

## 2017-06-25 DIAGNOSIS — Z8614 Personal history of Methicillin resistant Staphylococcus aureus infection: Secondary | ICD-10-CM

## 2017-06-25 DIAGNOSIS — Z823 Family history of stroke: Secondary | ICD-10-CM

## 2017-06-25 DIAGNOSIS — E46 Unspecified protein-calorie malnutrition: Secondary | ICD-10-CM | POA: Diagnosis present

## 2017-06-25 DIAGNOSIS — T50992A Poisoning by other drugs, medicaments and biological substances, intentional self-harm, initial encounter: Secondary | ICD-10-CM | POA: Diagnosis not present

## 2017-06-25 DIAGNOSIS — F1210 Cannabis abuse, uncomplicated: Secondary | ICD-10-CM | POA: Diagnosis present

## 2017-06-25 DIAGNOSIS — F329 Major depressive disorder, single episode, unspecified: Secondary | ICD-10-CM | POA: Diagnosis not present

## 2017-06-25 DIAGNOSIS — F603 Borderline personality disorder: Secondary | ICD-10-CM | POA: Diagnosis present

## 2017-06-25 DIAGNOSIS — M545 Low back pain: Secondary | ICD-10-CM | POA: Diagnosis not present

## 2017-06-25 DIAGNOSIS — Z79891 Long term (current) use of opiate analgesic: Secondary | ICD-10-CM | POA: Diagnosis not present

## 2017-06-25 DIAGNOSIS — J9690 Respiratory failure, unspecified, unspecified whether with hypoxia or hypercapnia: Secondary | ICD-10-CM

## 2017-06-25 DIAGNOSIS — Z888 Allergy status to other drugs, medicaments and biological substances status: Secondary | ICD-10-CM

## 2017-06-25 DIAGNOSIS — D649 Anemia, unspecified: Secondary | ICD-10-CM | POA: Diagnosis present

## 2017-06-25 DIAGNOSIS — R4587 Impulsiveness: Secondary | ICD-10-CM | POA: Diagnosis not present

## 2017-06-25 DIAGNOSIS — F1310 Sedative, hypnotic or anxiolytic abuse, uncomplicated: Secondary | ICD-10-CM | POA: Diagnosis present

## 2017-06-25 DIAGNOSIS — Z630 Problems in relationship with spouse or partner: Secondary | ICD-10-CM | POA: Diagnosis not present

## 2017-06-25 DIAGNOSIS — F314 Bipolar disorder, current episode depressed, severe, without psychotic features: Secondary | ICD-10-CM | POA: Diagnosis not present

## 2017-06-25 DIAGNOSIS — R1032 Left lower quadrant pain: Secondary | ICD-10-CM | POA: Diagnosis not present

## 2017-06-25 LAB — COMPREHENSIVE METABOLIC PANEL
ALBUMIN: 4 g/dL (ref 3.5–5.0)
ALK PHOS: 59 U/L (ref 38–126)
ALT: 19 U/L (ref 14–54)
ALT: 16 U/L (ref 14–54)
AST: 34 U/L (ref 15–41)
AST: 25 U/L (ref 15–41)
Albumin: 3.4 g/dL — ABNORMAL LOW (ref 3.5–5.0)
Alkaline Phosphatase: 64 U/L (ref 38–126)
Anion gap: 10 (ref 5–15)
Anion gap: 7 (ref 5–15)
BILIRUBIN TOTAL: 0.5 mg/dL (ref 0.3–1.2)
BUN: 16 mg/dL (ref 6–20)
BUN: 6 mg/dL (ref 6–20)
CALCIUM: 8 mg/dL — AB (ref 8.9–10.3)
CHLORIDE: 106 mmol/L (ref 101–111)
CO2: 23 mmol/L (ref 22–32)
CO2: 23 mmol/L (ref 22–32)
CREATININE: 0.55 mg/dL (ref 0.44–1.00)
Calcium: 8.7 mg/dL — ABNORMAL LOW (ref 8.9–10.3)
Chloride: 113 mmol/L — ABNORMAL HIGH (ref 101–111)
Creatinine, Ser: 0.42 mg/dL — ABNORMAL LOW (ref 0.44–1.00)
GFR calc Af Amer: >60 mL/min (ref >60)
GFR calc Af Amer: >60 mL/min (ref >60)
GLUCOSE: 177 mg/dL — AB (ref 65–99)
Glucose, Bld: 105 mg/dL — ABNORMAL HIGH (ref 65–99)
Potassium: 2.2 mmol/L — CL (ref 3.5–5.1)
Potassium: 3.2 mmol/L — ABNORMAL LOW (ref 3.5–5.1)
Sodium: 139 mmol/L (ref 135–145)
Sodium: 143 mmol/L (ref 135–145)
TOTAL PROTEIN: 6.3 g/dL — AB (ref 6.5–8.1)
Total Bilirubin: 0.7 mg/dL (ref 0.3–1.2)
Total Protein: 7.5 g/dL (ref 6.5–8.1)

## 2017-06-25 LAB — RAPID URINE DRUG SCREEN, HOSP PERFORMED
Amphetamines: NOT DETECTED
BARBITURATES: NOT DETECTED
Benzodiazepines: POSITIVE — AB
COCAINE: NOT DETECTED
Opiates: POSITIVE — AB
TETRAHYDROCANNABINOL: POSITIVE — AB

## 2017-06-25 LAB — BLOOD GAS, ARTERIAL
Acid-Base Excess: 7.7 mmol/L — ABNORMAL HIGH (ref 0.0–2.0)
Bicarbonate: 31.3 mmol/L — ABNORMAL HIGH (ref 20.0–28.0)
DRAWN BY: 25788
O2 Content: 2 L/min
O2 Saturation: 94.4 %
PH ART: 7.468 — AB (ref 7.350–7.450)
Patient temperature: 35.2
pCO2 arterial: 44.6 mmHg (ref 32.0–48.0)
pO2, Arterial: 71.4 mmHg — ABNORMAL LOW (ref 83.0–108.0)

## 2017-06-25 LAB — CBC WITH DIFFERENTIAL/PLATELET
Basophils Absolute: 0 10*3/uL (ref 0.0–0.1)
Basophils Relative: 0 %
Eosinophils Absolute: 0.1 10*3/uL (ref 0.0–0.7)
Eosinophils Relative: 2 %
HEMATOCRIT: 33.8 % — AB (ref 36.0–46.0)
HEMOGLOBIN: 10.5 g/dL — AB (ref 12.0–15.0)
LYMPHS PCT: 19 %
Lymphs Abs: 0.9 10*3/uL (ref 0.7–4.0)
MCH: 26.4 pg (ref 26.0–34.0)
MCHC: 31.1 g/dL (ref 30.0–36.0)
MCV: 84.9 fL (ref 78.0–100.0)
MONO ABS: 0.2 10*3/uL (ref 0.1–1.0)
MONOS PCT: 4 %
NEUTROS ABS: 3.5 10*3/uL (ref 1.7–7.7)
Neutrophils Relative %: 75 %
Platelets: 276 10*3/uL (ref 150–400)
RBC: 3.98 MIL/uL (ref 3.87–5.11)
RDW: 15 % (ref 11.5–15.5)
WBC: 4.6 10*3/uL (ref 4.0–10.5)

## 2017-06-25 LAB — MAGNESIUM
MAGNESIUM: 1.8 mg/dL (ref 1.7–2.4)
MAGNESIUM: 1.7 mg/dL (ref 1.7–2.4)

## 2017-06-25 LAB — PHOSPHORUS: Phosphorus: 2.7 mg/dL (ref 2.5–4.6)

## 2017-06-25 LAB — CBC
HEMATOCRIT: 30.2 % — AB (ref 36.0–46.0)
HEMOGLOBIN: 9.3 g/dL — AB (ref 12.0–15.0)
MCH: 26.3 pg (ref 26.0–34.0)
MCHC: 30.8 g/dL (ref 30.0–36.0)
MCV: 85.3 fL (ref 78.0–100.0)
Platelets: 224 10*3/uL (ref 150–400)
RBC: 3.54 MIL/uL — ABNORMAL LOW (ref 3.87–5.11)
RDW: 15.3 % (ref 11.5–15.5)
WBC: 9.3 10*3/uL (ref 4.0–10.5)

## 2017-06-25 LAB — URINALYSIS, ROUTINE W REFLEX MICROSCOPIC
BILIRUBIN URINE: NEGATIVE
Glucose, UA: 50 mg/dL — AB
Hgb urine dipstick: NEGATIVE
Ketones, ur: NEGATIVE mg/dL
LEUKOCYTES UA: NEGATIVE
NITRITE: NEGATIVE
PH: 6 (ref 5.0–8.0)
Protein, ur: 30 mg/dL — AB
RBC / HPF: NONE SEEN RBC/hpf (ref 0–5)
SPECIFIC GRAVITY, URINE: 1.019 (ref 1.005–1.030)

## 2017-06-25 LAB — POCT I-STAT 3, ART BLOOD GAS (G3+)
ACID-BASE DEFICIT: 1 mmol/L (ref 0.0–2.0)
BICARBONATE: 24.5 mmol/L (ref 20.0–28.0)
O2 SAT: 100 %
TCO2: 26 mmol/L (ref 22–32)
pCO2 arterial: 42.2 mmHg (ref 32.0–48.0)
pH, Arterial: 7.374 (ref 7.350–7.450)
pO2, Arterial: 187 mmHg — ABNORMAL HIGH (ref 83.0–108.0)

## 2017-06-25 LAB — PREGNANCY, URINE: Preg Test, Ur: NEGATIVE

## 2017-06-25 LAB — SALICYLATE LEVEL: Salicylate Lvl: <7 mg/dL (ref 2.8–30.0)

## 2017-06-25 LAB — ACETAMINOPHEN LEVEL

## 2017-06-25 LAB — GLUCOSE, CAPILLARY: GLUCOSE-CAPILLARY: 98 mg/dL (ref 65–99)

## 2017-06-25 LAB — MRSA PCR SCREENING: MRSA by PCR: NEGATIVE

## 2017-06-25 LAB — ETHANOL

## 2017-06-25 LAB — PROTIME-INR
INR: 1.11
Prothrombin Time: 14.3 seconds (ref 11.4–15.2)

## 2017-06-25 MED ORDER — SODIUM CHLORIDE 0.9 % IV BOLUS (SEPSIS)
1000.0000 mL | Freq: Once | INTRAVENOUS | Status: AC
  Administered 2017-06-25: 1000 mL via INTRAVENOUS

## 2017-06-25 MED ORDER — PROPOFOL BOLUS VIA INFUSION: 0.5000 mg/kg | INTRAVENOUS | Status: DC | PRN

## 2017-06-25 MED ORDER — SODIUM CHLORIDE 0.9 % IV SOLN: INTRAVENOUS | Status: AC

## 2017-06-25 MED ORDER — PROPOFOL 1000 MG/100ML IV EMUL
5.0000 ug/kg/min | INTRAVENOUS | Status: DC
  Administered 2017-06-26: 15 ug/kg/min via INTRAVENOUS
  Filled 2017-06-25: qty 100

## 2017-06-25 MED ORDER — SODIUM BICARBONATE 8.4 % IV SOLN
INTRAVENOUS | Status: AC
  Filled 2017-06-25: qty 50

## 2017-06-25 MED ORDER — PROPOFOL 1000 MG/100ML IV EMUL
5.0000 ug/kg/min | Freq: Once | INTRAVENOUS | Status: AC
  Administered 2017-06-25: 5 ug/kg/min via INTRAVENOUS
  Filled 2017-06-25: qty 100

## 2017-06-25 MED ORDER — SODIUM BICARBONATE 8.4 % IV SOLN
50.0000 meq | Freq: Once | INTRAVENOUS | Status: AC
  Administered 2017-06-25: 50 meq via INTRAVENOUS
  Filled 2017-06-25: qty 50

## 2017-06-25 MED ORDER — CHLORHEXIDINE GLUCONATE 0.12% ORAL RINSE (MEDLINE KIT)
15.0000 mL | Freq: Two times a day (BID) | OROMUCOSAL | Status: DC
  Administered 2017-06-25 – 2017-06-26 (×2): 15 mL via OROMUCOSAL

## 2017-06-25 MED ORDER — LORAZEPAM 2 MG/ML IJ SOLN
1.0000 mg | Freq: Once | INTRAMUSCULAR | Status: AC
  Administered 2017-06-25: 1 mg via INTRAVENOUS
  Filled 2017-06-25: qty 1

## 2017-06-25 MED ORDER — MIDAZOLAM HCL 2 MG/2ML IJ SOLN
INTRAMUSCULAR | Status: AC
  Administered 2017-06-25: 1 mg
  Filled 2017-06-25: qty 2

## 2017-06-25 MED ORDER — SODIUM CHLORIDE 0.9 % IV SOLN
INTRAVENOUS | Status: DC
  Administered 2017-06-25: 14:00:00 via INTRAVENOUS
  Administered 2017-06-26: 50 mL/h via INTRAVENOUS
  Administered 2017-06-26: 75 mL/h via INTRAVENOUS
  Administered 2017-06-27: 16:00:00 via INTRAVENOUS

## 2017-06-25 MED ORDER — ACTIDOSE WITH SORBITOL 50 GM/240ML PO LIQD: 50.0000 g | Freq: Once | ORAL | Status: DC

## 2017-06-25 MED ORDER — POTASSIUM CHLORIDE 10 MEQ/100ML IV SOLN
10.0000 meq | INTRAVENOUS | Status: AC
  Administered 2017-06-25 (×4): 10 meq via INTRAVENOUS
  Filled 2017-06-25 (×4): qty 100

## 2017-06-25 MED ORDER — NALOXONE HCL 2 MG/2ML IJ SOSY
1.0000 mg | PREFILLED_SYRINGE | Freq: Once | INTRAMUSCULAR | Status: AC
  Administered 2017-06-25: 1 mg via INTRAVENOUS

## 2017-06-25 MED ORDER — PERMETHRIN 1 % EX LOTN
1.0000 "application " | TOPICAL_LOTION | Freq: Once | CUTANEOUS | Status: AC
  Administered 2017-06-25: 1 via TOPICAL
  Filled 2017-06-25: qty 59

## 2017-06-25 MED ORDER — HEPARIN SODIUM (PORCINE) 5000 UNIT/ML IJ SOLN
5000.0000 [IU] | Freq: Two times a day (BID) | INTRAMUSCULAR | Status: DC
  Administered 2017-06-25 – 2017-07-02 (×13): 5000 [IU] via SUBCUTANEOUS
  Filled 2017-06-25 (×15): qty 1

## 2017-06-25 MED ORDER — MIDAZOLAM HCL 2 MG/2ML IJ SOLN
1.0000 mg | Freq: Once | INTRAMUSCULAR | Status: AC
  Administered 2017-06-25: 1 mg via INTRAVENOUS

## 2017-06-25 MED ORDER — PROPOFOL 1000 MG/100ML IV EMUL
5.0000 ug/kg/min | Freq: Once | INTRAVENOUS | Status: AC
  Administered 2017-06-25: 5 ug/kg/min via INTRAVENOUS

## 2017-06-25 MED ORDER — PROPOFOL 1000 MG/100ML IV EMUL: 5.0000 ug/kg/min | Freq: Once | INTRAVENOUS | Status: DC

## 2017-06-25 MED ORDER — PROPOFOL 1000 MG/100ML IV EMUL
INTRAVENOUS | Status: AC
  Filled 2017-06-25: qty 100

## 2017-06-25 MED ORDER — ACETAMINOPHEN 325 MG PO TABS
650.0000 mg | ORAL_TABLET | ORAL | Status: DC | PRN
  Administered 2017-07-01 – 2017-07-02 (×2): 650 mg via ORAL
  Filled 2017-06-25 (×2): qty 2

## 2017-06-25 MED ORDER — ACTIDOSE WITH SORBITOL 50 GM/240ML PO LIQD
50.0000 g | Freq: Once | ORAL | Status: AC
  Administered 2017-06-25: 50 g
  Filled 2017-06-25: qty 240

## 2017-06-25 MED ORDER — FAMOTIDINE 40 MG/5ML PO SUSR
20.0000 mg | Freq: Two times a day (BID) | ORAL | Status: DC
  Administered 2017-06-25 – 2017-06-26 (×2): 20 mg
  Filled 2017-06-25 (×14): qty 2.5

## 2017-06-25 MED ORDER — SODIUM BICARBONATE 8.4 % IV SOLN: 50.0000 meq | Freq: Once | INTRAVENOUS | Status: DC

## 2017-06-25 MED ORDER — ORAL CARE MOUTH RINSE
15.0000 mL | Freq: Four times a day (QID) | OROMUCOSAL | Status: DC
  Administered 2017-06-25 – 2017-06-26 (×4): 15 mL via OROMUCOSAL

## 2017-06-25 MED ORDER — SODIUM BICARBONATE 8.4 % IV SOLN
50.0000 meq | Freq: Once | INTRAVENOUS | Status: AC
  Administered 2017-06-25: 50 meq via INTRAVENOUS

## 2017-06-25 MED ORDER — NALOXONE HCL 2 MG/2ML IJ SOSY
PREFILLED_SYRINGE | INTRAMUSCULAR | Status: AC
  Filled 2017-06-25: qty 2

## 2017-06-25 MED ORDER — SODIUM CHLORIDE 0.9 % IV SOLN: 250.0000 mL | INTRAVENOUS | Status: DC | PRN

## 2017-06-25 NOTE — ED Notes (Signed)
Pt boyfriend contacted [facility] Mons( [place] [phone]), Boyfriend stated that he left the house at 10:10 am to go to store and came back home an hour and a half later (11:40 am) and found pt in floor.   Pt Father Ronny Bacon(Steve Clites [phone]), Pt father stated to boyfriend that pt called him at 10:41 am stating " I took both bottles of pills".

## 2017-06-25 NOTE — Progress Notes (Signed)
1450-ABG-Ph-7.50, PC02 35.3, PO2 104, HCO3 29.0, SO2 98.2 VENT VT 350, RR 16, 40% +5 peep

## 2017-06-25 NOTE — ED Notes (Signed)
Post foley insertion, slight movement in pt lower extremities noted.

## 2017-06-25 NOTE — ED Notes (Signed)
BP 85/60 Dr Clarene DukeMcmanus notified ordered to decrease Diprovan 2.5 and bolus fluids

## 2017-06-25 NOTE — ED Notes (Signed)
RT at bedside attempting to obtain ABG 

## 2017-06-25 NOTE — ED Notes (Signed)
ABG  Ph- 7.466 pco2 44.6 p02-71.4   Oximetry values cthb- 9.8 s02- 94.4% F02hb- 91.6 FC0 Hb 2.3%  Temp corrected values PH 7.493 pc02- 40.9 p02 - 63.7  Oxygen status cto2c- 12.7 p50c 25.77  Acid status  Base 7.7 chco3- 31.3

## 2017-06-25 NOTE — Progress Notes (Signed)
1305-ABG-Ph,7.49, PCo240.9, PO2 63.7, HCO3 31.3, SO2 94.4

## 2017-06-25 NOTE — ED Notes (Signed)
Per poison control, suspected time of ingestion is off due to prolonged QTc interval.  Continuously cardiac monitoring, serial ekg'S q 2-3 hours, Sodium bicarb bolus 1-2 MEQ IV push, Immediate EKG, if qtc>100 repeat sodium bicarb push IV, if no change consult toxicology. Do not recommend Sodium Bicarb drip.   Due to qtc > 500, recommend to obtain magnesium and potassium levels, if low repeat to keep on high side of normal.  No geodon or haldol.  4 hour tylenol level rule out aspirin,  if seizure treat benzos/phenobarbitol, due to pt tachycardia pt at increased risk for seizure.  Supportive care  Follow up with [facility] toxic[facility]

## 2017-06-25 NOTE — ED Notes (Signed)
PT NOTED TO HAVE WHITE NITS IN HAIR AND FLEAS CRAWLING IN HEAD

## 2017-06-25 NOTE — ED Notes (Signed)
Pt noted to be making clicking sounds with mouth and decerebrate posturing

## 2017-06-25 NOTE — ED Provider Notes (Signed)
[facility] EMERGENCY DEPARTMENT Provider Note   CSN: [id number] Arrival date & time: 06/25/17  1233     History   Chief Complaint Chief Complaint  Patient presents with  . Drug Overdose    HPI Judy Graham is a 27 y.o. female.  The history is provided by the EMS personnel and a relative. The history is limited by the condition of the patient (unresponsive).  Drug Overdose   Pt was seen at 1235 on arrival to ED exam room. Per EMS and family report:  Pt's boyfriend told EMS that he left to go to the store this morning 1010am. He came home 1140am and found pt unresponsive. Pt texted her father at 2am and told him that she "took both bottles of pills." Pt's husband states pt's amitriptyline 25mg  tabs bottle was full, #60 tabs. Bottle of buspirone also found empty by EMS.  EMS gave IV narcan 1mg  without effect. Pt remained unresponsive en route.    Past Medical History:  Diagnosis Date  . Scoliosis     Patient Active Problem List   Diagnosis Date Noted  . Chronic hepatitis C without hepatic coma (HCC) 04/28/2017  . Chronic bilateral low back pain with right-sided sciatica 03/30/2017  . Abdominal pain 07/01/2016  . Chronic headache 06/17/2016  . Bipolar and related disorder (HCC) 06/05/2015  . IUGR (intrauterine growth restriction) affecting care of mother 08/14/2014  . Cigarette smoker   . MRSA (methicillin resistant staph aureus) culture positive 05/26/2014  . Abnormal findings on antenatal screening   . Elevated AFP   . Choroid plexus cyst of fetus   . Abnormal MSAFP (maternal serum alpha-fetoprotein), elevated   . High risk pregnancy with high inhibin   . Choroid plexus cyst   . UTI (urinary tract infection) in pregnancy in second trimester 03/08/2014  . Marijuana use 03/08/2014  . Abnormal quad screen 03/08/2014  . Late prenatal care affecting pregnancy in second trimester, antepartum 03/04/2014  . Scoliosis 03/04/2014  . Anxiety disorder 09/05/2013  .  Adjustment disorder with mixed emotional features 09/01/2013  . S/P spinal fusion 08/29/2011    Past Surgical History:  Procedure Laterality Date  . BACK SURGERY    . KNEE SURGERY      OB History    Gravida Para Term Preterm AB Living   4 1 1  0 2 1   SAB TAB Ectopic Multiple Live Births   2 0 0 0 1       Home Medications    Prior to Admission medications   Medication Sig Start Date End Date Taking? Authorizing Provider  diazepam (VALIUM) 10 MG tablet Take 10 mg by mouth 3 (three) times daily.    [provider]  oxyCODONE-acetaminophen (PERCOCET) 10-325 MG tablet Take 1 tablet by mouth 2 (two) times daily.    [provider]    Family History Family History  Problem Relation Age of Onset  . Cancer Mother   . Cancer Maternal Grandmother   . Diabetes Paternal Grandmother   . Stroke Paternal Grandmother   . Hypertension Father     Social History Social History   Tobacco Use  . Smoking status: Current Every Day Smoker    Packs/day: 0.50    Types: Cigarettes  . Smokeless tobacco: Never Used  Substance Use Topics  . Alcohol use: Yes    Comment: occ  . Drug use: No     Allergies   Clindamycin/lincomycin; Gabapentin; Flexeril [cyclobenzaprine]; and Gluten meal   Review of Systems  Review of Systems  Unable to perform ROS: Patient unresponsive     Physical Exam Updated Vital Signs BP 95/61   Pulse (!) 141   Temp (!) 95.7 F (35.4 C)   Resp (!) 25   Ht 5\' 7"  (1.702 m)   Wt 45.4 kg (100 lb)   LMP 06/10/2017   SpO2 96%   BMI 15.66 kg/m    Patient Vitals for the past 24 hrs:  BP Temp Pulse Resp SpO2 Height Weight  06/25/17 1530 116/85 (!) 96.4 F (35.8 C) (!) 117 16 100 % - -  06/25/17 1515 107/74 (!) 96.1 F (35.6 C) (!) 115 16 100 % - -  06/25/17 1500 (!) 88/60 (!) 95.9 F (35.5 C) (!) 120 16 100 % - -  06/25/17 1445 117/89 (!) 95.7 F (35.4 C) (!) 122 18 100 % - -  06/25/17 1430 (!) 121/93 (!) 95.5 F (35.3 C) (!) 120 16  100 % - -  06/25/17 1415 (!) 130/95 (!) 95.4 F (35.2 C) (!) 124 15 100 % - -  06/25/17 1400 (!) 135/95 (!) 95.4 F (35.2 C) (!) 127 14 100 % - -  06/25/17 1356 - - - - 100 % - -  06/25/17 1345 (!) 125/94 (!) 95.4 F (35.2 C) (!) 121 16 100 % - -  06/25/17 1330 133/88 (!) 95.5 F (35.3 C) (!) 127 (!) 24 99 % - -  06/25/17 1327 (!) 126/97 (!) 95.5 F (35.3 C) (!) 126 (!) 26 98 % - -  06/25/17 1326 - (!) 95.5 F (35.3 C) (!) 127 (!) 21 98 % - -  06/25/17 1300 108/74 (!) 95.9 F (35.5 C) (!) 139 (!) 22 95 % - -  06/25/17 1248 - (!) 95.7 F (35.4 C) (!) 141 (!) 25 96 % - -  06/25/17 1245 - - - - - 5\' 7"  (1.702 m) 45.4 kg (100 lb)  06/25/17 1236 95/61 - (!) 147 - 92 % - -    Physical Exam 1235: Physical examination:  Nursing notes reviewed; Vital signs and O2 SAT reviewed;  Constitutional: Thin, frail. In no acute distress; Head:  Normocephalic, atraumatic; Eyes: EOMI, PERRL, No scleral icterus; ENMT: Mouth and pharynx normal, Mucous membranes very dry; Neck: Supple, Trachea midline; Cardiovascular: Tachycardic rate and rhythm, No gallop; Respiratory: Breath sounds clear & equal bilaterally, No wheezes. Shallow respiratory effort/excursion; Chest: Nontender, Movement normal; Abdomen: Soft, Nontender, Nondistended, Normal bowel sounds; Genitourinary: No apparent CVA tenderness; Extremities: Pulses normal, No deformity. No edema, No calf edema or asymmetry.; Neuro: Unresponsive. Eyes closed. Minimal gag. No facial droop. Moves all extremities minimally spontaneously and to painful stimuli on stretcher..; Skin: Color normal, Warm, Dry.   ED Treatments / Results  Labs (all labs ordered are listed, but only abnormal results are displayed)   EKG  EKG Interpretation  Date/Time:  Sunday June 25 2017 12:40:12 EDT   #1 EKG (before IV bicarb) Ventricular Rate:  145 PR Interval:    QRS Duration: 135 QT Interval:  404 QTC Calculation: 628 R Axis:   146 Text Interpretation:  Sinus tachycardia  Rightward axis RBBB and LPFB When compared with ECG of 11/11/2009 Rate faster (RBBB and left posterior fascicular block) are now Present Confirmed by Samuel Jester [phone]) on 06/25/2017 12:54:01 PM      ED ECG REPORT   Date: 06/25/2017 1311   #2 EKG after IV bicarb #1  Rate: 135  Rhythm: sinus tachycardia  QRS Axis: right  Intervals:  QT prolonged 649  ST/T Wave abnormalities: normal  Conduction Disutrbances:right bundle branch block and left posterior fascicular block  Narrative Interpretation: QRS 134  Old EKG Reviewed: unchanged I have personally reviewed the EKG tracing and agree with the computerized printout as noted.     EKG Interpretation  Date/Time:  Sunday June 25 2017 13:22:40 EDT   #3 EKG after IV bicarb #2 Ventricular Rate:  128 PR Interval:    QRS Duration: 131 QT Interval:  304 QTC Calculation: 444 R Axis:   111 Text Interpretation:  Sinus tachycardia LAE, consider biatrial enlargement RBBB and LPFB Since last tracing of earlier today No significant change was found Confirmed by Samuel Jester [phone]) on 06/25/2017 3:06:22 PM      ED ECG REPORT   Date: 06/25/2017  1328  #4 EKG after IV bicarb #3  Rate: 127  Rhythm: sinus tachycardia  QRS Axis: right  Intervals: QT prolonged, 591  ST/T Wave abnormalities: normal  Conduction Disutrbances:right bundle branch block and left posterior fascicular block  Narrative Interpretation: QRS 133  Old EKG Reviewed: unchanged I have personally reviewed the EKG tracing and agree with the computerized printout as noted.    EKG Interpretation  Date/Time:  Sunday June 25 2017 14:01:23 EDT  #5 EKG after IV bicarb x3 amp Ventricular Rate:  124 PR Interval:    QRS Duration: 145 QT Interval:  452 QTC Calculation: 650 R Axis:   124 Text Interpretation:  Sinus tachycardia Right bundle branch block Since last tracing of earlier today No significant change was found Confirmed by Samuel Jester [phone]) on 06/25/2017  3:09:37 PM       ED ECG REPORT   Date: 06/25/2017 1522   #6 EKG after IV potassium #1  Rate: 113  Rhythm: sinus tachycardia  QRS Axis: right  Intervals: QT prolonged 519  ST/T Wave abnormalities: normal  Conduction Disutrbances:right bundle branch block  Narrative Interpretation: QRS 121  Old EKG Reviewed: unchanged I have personally reviewed the EKG tracing and agree with the computerized printout as noted.      Radiology  Procedures Procedures (including critical care time)  Airway procedure:  Timeout: Pre-procedure timeout not performed due to emergent nature of procedure; Indication: Unresponsive;  Oxygen Saturation: 100 %; Oxygen concentration: 100 %; Preoxygenation: Bag-valve-mask;  Medication: IV versed 1mg ; Procedure: Minimal secretions suctioned, mucus membranes very dry, cracked and friable. RSI, Glidescope laryngoscopy with LoPro3, Endotracheal intubation with 6.40mm cuffed endotracheal tube, Bag-valve-tube ventilation, Mechanical ventilation;  Reassessment: Unsuccessful intubation.  No obvious trauma in oral cavity or posterior pharynx. Teeth intact, without obvious trauma. Breath sounds heard over stomach, no CO2 detector color change. Oxygen saturation remains normal.  Airway procedure:  Timeout: Pre-procedure timeout not performed due to emergent nature of procedure; Indication: Unresponsive;  Oxygen Saturation: 100 %; Oxygen concentration: 100 %; Preoxygenation: Bag-valve-mask;  Medication: IV versed 1mg ; Procedure: Minimal secretions suctioned, mucus membranes very dry and cracked. RSI, Glidescope laryngoscopy with LoPro4, Endotracheal intubation with 6.67mm cuffed endotracheal tube, Bag-valve-tube ventilation, Mechanical ventilation;  Reassessment: Successful intubation, No bleeding or obvious trauma in oral cavity or posterior pharynx. Teeth intact, without obvious trauma. Breath sounds equal bilaterally, No breath sounds heard over stomach, Chest movement  symmetrical, CO2 detector color change, Endotracheal tube fogging, Oxygen saturation normal. Post-procedure xray obtained.      Medications Ordered in ED Medications  0.9 %  sodium chloride infusion ( Intravenous New Bag/Given 06/25/17 1411)  sodium bicarbonate 1 mEq/mL injection (not administered)  propofol (DIPRIVAN) 1000 MG/100ML infusion (  not administered)  potassium chloride 10 mEq in 100 mL IVPB (10 mEq Intravenous New Bag/Given 06/25/17 1425)  0.9 %  sodium chloride infusion (not administered)  sodium chloride 0.9 % bolus 1,000 mL (1,000 mLs Intravenous New Bag/Given 06/25/17 1256)  naloxone (NARCAN) injection 1 mg (1 mg Intravenous Given by Other 06/25/17 1236)  sodium bicarbonate injection 50 mEq (50 mEq Intravenous Given 06/25/17 1310)  sodium bicarbonate injection 50 mEq (50 mEq Intravenous Given by Other 06/25/17 1317)  sodium bicarbonate injection 50 mEq (50 mEq Intravenous Given by Other 06/25/17 1326)  midazolam (VERSED) 2 MG/2ML injection (1 mg  Given 06/25/17 1345)  midazolam (VERSED) injection 1 mg (1 mg Intravenous Given 06/25/17 1351)  LORazepam (ATIVAN) injection 1 mg (1 mg Intravenous Given 06/25/17 1418)  propofol (DIPRIVAN) 1000 MG/100ML infusion (5 mcg/kg/min  45.4 kg Intravenous New Bag/Given 06/25/17 1421)  activated charcoal-sorbitol (ACTIDOSE-SORBITOL) suspension 50 g (50 g Per Tube Given 06/25/17 1440)     Initial Impression / Assessment and Plan / ED Course  I have reviewed the triage vital signs and the nursing notes.  Pertinent labs & imaging results that were available during my care of the patient were reviewed by me and considered in my medical decision making (see chart for details).  MDM Reviewed: previous chart, nursing note and vitals Reviewed previous: labs and ECG Interpretation: labs, ECG and x-ray Total time providing critical care: 75-105 minutes. This excludes time spent performing separately reportable procedures and services. Consults:  critical care (and Poison Control MD)   CRITICAL CARE Performed by: Laray Anger Total critical care time: 90 minutes Critical care time was exclusive of separately billable procedures and treating other patients. Critical care was necessary to treat or prevent imminent or life-threatening deterioration. Critical care was time spent personally by me on the following activities: development of treatment plan with patient and/or surrogate as well as nursing, discussions with consultants, evaluation of patient's response to treatment, examination of patient, obtaining history from patient or surrogate, ordering and performing treatments and interventions, ordering and review of laboratory studies, ordering and review of radiographic studies, pulse oximetry and re-evaluation of patient's condition.    Results for orders placed or performed during the hospital encounter of 06/25/17  Urine rapid drug screen (hosp performed)  Result Value Ref Range   Opiates POSITIVE (A) NONE DETECTED   Cocaine NONE DETECTED NONE DETECTED   Benzodiazepines POSITIVE (A) NONE DETECTED   Amphetamines NONE DETECTED NONE DETECTED   Tetrahydrocannabinol POSITIVE (A) NONE DETECTED   Barbiturates NONE DETECTED NONE DETECTED  Urinalysis, Routine w reflex microscopic  Result Value Ref Range   Color, Urine YELLOW YELLOW   APPearance CLEAR CLEAR   Specific Gravity, Urine 1.019 1.005 - 1.030   pH 6.0 5.0 - 8.0   Glucose, UA 50 (A) NEGATIVE mg/dL   Hgb urine dipstick NEGATIVE NEGATIVE   Bilirubin Urine NEGATIVE NEGATIVE   Ketones, ur NEGATIVE NEGATIVE mg/dL   Protein, ur 30 (A) NEGATIVE mg/dL   Nitrite NEGATIVE NEGATIVE   Leukocytes, UA NEGATIVE NEGATIVE   RBC / HPF NONE SEEN 0 - 5 RBC/hpf   WBC, UA 0-5 0 - 5 WBC/hpf   Bacteria, UA RARE (A) NONE SEEN   Squamous Epithelial / LPF 0-5 (A) NONE SEEN   Mucus PRESENT   Pregnancy, urine  Result Value Ref Range   Preg Test, Ur NEGATIVE NEGATIVE  Acetaminophen  level  Result Value Ref Range   Acetaminophen (Tylenol), Serum <10 (L) 10 - 30 ug/mL  Comprehensive metabolic panel  Result Value Ref Range   Sodium 139 135 - 145 mmol/L   Potassium 2.2 (LL) 3.5 - 5.1 mmol/L   Chloride 106 101 - 111 mmol/L   CO2 23 22 - 32 mmol/L   Glucose, Bld 177 (H) 65 - 99 mg/dL   BUN 16 6 - 20 mg/dL   Creatinine, Ser 1.610.55 0.44 - 1.00 mg/dL   Calcium 8.7 (L) 8.9 - 10.3 mg/dL   Total Protein 7.5 6.5 - 8.1 g/dL   Albumin 4.0 3.5 - 5.0 g/dL   AST 34 15 - 41 U/L   ALT 19 14 - 54 U/L   Alkaline Phosphatase 64 38 - 126 U/L   Total Bilirubin 0.7 0.3 - 1.2 mg/dL   GFR calc non Af Amer >60 >60 mL/min   GFR calc Af Amer >60 >60 mL/min   Anion gap 10 5 - 15  Ethanol  Result Value Ref Range   Alcohol, Ethyl (B) <10 <10 mg/dL  Salicylate level  Result Value Ref Range   Salicylate Lvl <7.0 2.8 - 30.0 mg/dL  CBC with Differential  Result Value Ref Range   WBC 4.6 4.0 - 10.5 K/uL   RBC 3.98 3.87 - 5.11 MIL/uL   Hemoglobin 10.5 (L) 12.0 - 15.0 g/dL   HCT 09.633.8 (L) 04.536.0 - 40.946.0 %   MCV 84.9 78.0 - 100.0 fL   MCH 26.4 26.0 - 34.0 pg   MCHC 31.1 30.0 - 36.0 g/dL   RDW 81.115.0 91.411.5 - 78.215.5 %   Platelets 276 150 - 400 K/uL   Neutrophils Relative % 75 %   Neutro Abs 3.5 1.7 - 7.7 K/uL   Lymphocytes Relative 19 %   Lymphs Abs 0.9 0.7 - 4.0 K/uL   Monocytes Relative 4 %   Monocytes Absolute 0.2 0.1 - 1.0 K/uL   Eosinophils Relative 2 %   Eosinophils Absolute 0.1 0.0 - 0.7 K/uL   Basophils Relative 0 %   Basophils Absolute 0.0 0.0 - 0.1 K/uL  Protime-INR  Result Value Ref Range   Prothrombin Time 14.3 11.4 - 15.2 seconds   INR 1.11   Magnesium  Result Value Ref Range   Magnesium 1.8 1.7 - 2.4 mg/dL   Dg Chest Port 1 View Result Date: 06/25/2017 CLINICAL DATA:  Overdose EXAM: PORTABLE CHEST 1 VIEW COMPARISON:  10/08/2011 FINDINGS: Normal heart size and mediastinal contours. Scoliosis with extensive fixation hardware. Mildly low lung volumes. There is no edema,  consolidation, effusion, or pneumothorax. IMPRESSION: No evidence of active disease. Electronically Signed   By: Marnee SpringJonathon  Watts M.D.   On: 06/25/2017 13:06   Dg Chest Port 1 View Result Date: 06/25/2017 CLINICAL DATA:  Status post endotracheal and orogastric tube placement. EXAM: PORTABLE CHEST 1 VIEW COMPARISON:  06/25/2017 at 1252 hours FINDINGS: The endotracheal tube tip projects 4 cm above the carina. Nasal/orogastric tube passes below the diaphragm well into the stomach. There is hazy opacity projecting in the left mid to upper lung, which has developed since the prior study. Remainder of the lungs is clear. No pneumothorax or pleural effusion. IMPRESSION: 1. Endotracheal tube tip 4 cm above the carinal. Nasal/orogastric tube well positioned passing well below the diaphragm into the stomach. 2. Patient has developed an area of airspace opacity in the left mid to upper lung consistent with pneumonia in the proper clinical setting. Electronically Signed   By: Amie Portlandavid  Ormond M.D.   On: 06/25/2017 14:17      1245:  Pt unresponsive  on arrival, holding airway with 100% O2 Sats. IV narcan 2mg  total given without effect.  Initial SBP's 90's, HR 120-130's. 1st EKG obtained: new RBBB/wide QRS, IV bicarb given while ED RN speaking with Poison Control.    1300:  Poison Control continues to recommend IV bicarb push (vs gtt) and repeat EKG's. Workup in progress.  1340:  Pt making clicking sounds with her tongue and occasionally posturing with bilat UE's. Initial ABG without significant abnormalities, but given pt's unresponsiveness, will need airway protection. (ABG not crossing over into Epic).   1350:  IV versed 1mg  initially given for intubation. Pt with minimal gag, minimal secretions. Mucus membranes and very dry, cracking, friable. 2nd IV versed 1mg  given. Initial ETT unsuccessful, 2nd ETT passed and is in good position with lung sounds equal bilat/equal chest expansion. OGT passed. PCXR obtained.    1415: IV bicarb 63mEq/kg given total with minimal change in QRS on EKG's. New RBBB continues. SBP remains improved (120-130's) and HR remains 120's. Sats 100% on vent. T/C returned from [employer] Dr. Meridee Score, case discussed, including:  HPI, pertinent PM/SHx, VS/PE, dx testing, ED course and treatment:  Agrees with ED treatment thus far, recommends charcoal 50mg  PO (with or without sorbitol, it's OK either) as anticholinergic effects of med will slow gut and increase absorption therefore charcoal may decrease some absorption (benefits outweigh risks at this point and pt's airway is secure), IV propofol gtt and IV ativan pushes for sedation, IV potassium runs (no PO), will need balance of potassium repletion and more bicarb, check EKGs (QRS) as infusing potassium runs each hour, give more IV bicarb pushes (no gtt) if BP drops or QRS does not narrow, IV intralipid prn (will fax dosing), transport to Critical Care service ASAP.   1425:  Meds above ordered.  T/C returned from [facility] Dr. Sherren Kerns, case discussed, including:  HPI, pertinent PM/SHx, VS/PE, dx testing, ED course and treatment:  Agrees with above recommendations, including charcoal via OGT, accepts transfer to [facility] ICU. [facility] will send transport now.   1525:   IV potassium #1 infused. Pt's EKG with improving QRS duration and QT interval. VS stable. [facility] here for transport.    Final Clinical Impressions(s) / ED Diagnoses   Final diagnoses:  None    ED Discharge Orders    None       Samuel Jester, DO 06/26/17 2230

## 2017-06-25 NOTE — ED Notes (Signed)
Father notified of transfer

## 2017-06-25 NOTE — ED Notes (Signed)
Decerebrate posturing noted. EDP at bedside.  Poison control contacted post sodium bicarb x3 administrations with minimal improvement on EKG.   Poison control contacting toxicology and given EDP contact number. Poison control also inquiring about use of propofol with intubation for this pt.

## 2017-06-25 NOTE — Procedures (Signed)
Intubation Procedure Note Judy Graham [id number] 10-Jan-1991  Procedure: Intubation Indications: Airway protection and maintenance  Procedure Details Consent: Risks of procedure as well as the alternatives and risks of each were explained to the (patient/caregiver).  Consent for procedure obtained. Time Out: Verified patient identification, verified procedure, site/side was marked, verified correct patient position, special equipment/implants available, medications/allergies/relevent history reviewed, required imaging and test results available.  Performed  Maximum sterile technique was used including gloves and hand hygiene.  MAC and 4    Evaluation Hemodynamic Status: BP stable throughout; O2 sats: stable throughout Patient's Current Condition: stable Complications: No apparent complications Patient did tolerate procedure well. Chest X-ray ordered to verify placement.  CXR: pending.   Elsie Stain 06/25/2017

## 2017-06-25 NOTE — ED Triage Notes (Signed)
Per EMS, pt took unknown substance approximately 30 minutes prior to EMS arrival. Pt cbg 197. Pt lethargic not responsive to painful stimuli, shallow respirations. 1 mg of narcan administered en route with no change in patient alertness.  Per ems, pt husband went to store this am and when he came back pt was unresponsive. Per husband, pt known to have full bottle of amitriptyline in home. At time of EMS arrival this bottle found to be empty.

## 2017-06-25 NOTE — ED Notes (Signed)
Report given to [facility] 

## 2017-06-25 NOTE — ED Notes (Signed)
CRITICAL VALUE ALERT  Critical Value:  2.2 K  Date & Time Notied:  1355 06/25/17  Provider Notified: yes  Orders Received/Actions taken: Kt runs

## 2017-06-25 NOTE — ED Notes (Signed)
First attempt to intubate unsuccessful

## 2017-06-25 NOTE — ED Notes (Signed)
ABG results  Temp corrected values  PH 7.509 PCo2 35.3 Po2  104  Oxygen status cto2- 13.6 p50- 23.99  Acid base status Base- 5.0 cHCO 29.0

## 2017-06-25 NOTE — ED Notes (Signed)
Awaiting return call from [facility] nurse to give report

## 2017-06-25 NOTE — H&P (Signed)
Name:Judy Graham, Judy Graham Female, 27 y.o., 1991/01/07  Chart reviewed, patient seen and examined.   Chief Complaint  Patient presents with  . Drug Overdose   Took  amitriptyline and buspar today  27 y/o female with scoliosis, depression, suicidally overdosed on amitryptiline and buspar, then was found unresponsive, in  [facility] ED, she was intubated for airway protection, got charcoal, Na Bicarb 3 amp, iv fluids; Poison control was in contact with ED and advised Rx.  On propofol for sedation on vent. No family here. Lice and fleas noted on scalp hair. Recently seen by Psych.       has a past medical history of Scoliosis.   has a past surgical history that includes Back surgery and Knee surgery.  Pt - intubated- Unable to obtain Family History, Social History,      Pt - intubated-  ROS     Current Facility-Administered Medications:  .  0.9 %  sodium chloride infusion, , Intravenous, Continuous, Sivakumar, Siva P, MD, Last Rate: 75 mL/hr at 06/25/17 1858 .  0.9 %  sodium chloride infusion, , Intravenous, STAT, McManus, Kathleen, DO .  0.9 %  sodium chloride infusion, 250 mL, Intravenous, PRN, Sivakumar, Siva P, MD .  acetaminophen (TYLENOL) tablet 650 mg, 650 mg, Oral, Q4H PRN, Sivakumar, Siva P, MD .  chlorhexidine gluconate (MEDLINE KIT) (PERIDEX) 0.12 % solution 15 mL, 15 mL, Mouth Rinse, BID, Sivakumar, Siva P, MD .  famotidine (PEPCID) 40 MG/5ML suspension 20 mg, 20 mg, Per Tube, BID, Sivakumar, Siva P, MD .  heparin injection 5,000 Units, 5,000 Units, Subcutaneous, Q12H, Sivakumar, Siva P, MD .  Derrill Memo ON 06/26/2017] MEDLINE mouth rinse, 15 mL, Mouth Rinse, QID, Sivakumar, Siva P, MD .  permethrin (ELIMITE) 1 % lotion 1 application, 1 application, Topical, Once, Sivakumar, Siva P, MD .  sodium bicarbonate 1 mEq/mL injection, , , ,  . chlorhexidine gluconate (MEDLINE KIT)  15 mL Mouth Rinse BID  . famotidine  20 mg Per Tube BID  . heparin  5,000 Units Subcutaneous Q12H  . [START  ON 06/26/2017] mouth rinse  15 mL Mouth Rinse QID  . permethrin  1 application Topical Once  . sodium bicarbonate         Physical Exam: Blood pressure 113/87, pulse (!) 111, temperature 97.9 F (36.6 C), resp. rate 18, height _0  (1.702 m), weight 100 lb (45.4 kg), last menstrual period 06/10/2017, SpO2 99 %, unknown if currently breastfeeding.  General appearance  Orally intubated, sedated, comfortable, synchronous with vent, Vent Mode: PRVC FiO2 (%):  [40 %] 40 % Set Rate:  [16 bmp] 16 bmp Vt Set:  [350 mL] 350 mL PEEP:  [5 cmH20] 5 cmH20 Plateau Pressure:  [13 cmH20] 13 cmH20   HEENT No pallor No swelling in the neck     Cardiovascular S1 and S2 regular no Murmur/gallop   Respiratory Chest expansion equal Breath sounds equal and clear   Abdomen   Soft,Nontender, protuberant, No organomegaly,No mass Bowel sounds+   Extremities No swelling,No  edema    Neurological  Sedated Pupils equal and reacting     Labs  Results for BRAELYNNE, GARINGER (MRN [id number]) as of 06/25/2017 19:03  Ref. Range 06/25/2017 13:06 06/25/2017 13:07  Sodium Latest Ref Range: 135 - 145 mmol/L 139   Potassium Latest Ref Range: 3.5 - 5.1 mmol/L 2.2 (LL)   Chloride Latest Ref Range: 101 - 111 mmol/L 106   CO2 Latest Ref Range: 22 - 32 mmol/L 23   Glucose Latest  Ref Range: 65 - 99 mg/dL 177 (H)   BUN Latest Ref Range: 6 - 20 mg/dL 16   Creatinine Latest Ref Range: 0.44 - 1.00 mg/dL 0.55   Calcium Latest Ref Range: 8.9 - 10.3 mg/dL 8.7 (L)   Anion gap Latest Ref Range: 5 - 15  10   Magnesium Latest Ref Range: 1.7 - 2.4 mg/dL  1.8  Alkaline Phosphatase Latest Ref Range: 38 - 126 U/L 64   Albumin Latest Ref Range: 3.5 - 5.0 g/dL 4.0   AST Latest Ref Range: 15 - 41 U/L 34   ALT Latest Ref Range: 14 - 54 U/L 19   Total Protein Latest Ref Range: 6.5 - 8.1 g/dL 7.5   Total Bilirubin Latest Ref Range: 0.3 - 1.2 mg/dL 0.7      Results for KARENE, BRACKEN (MRN [id number]) as of 06/25/2017  19:03  Ref. Range 06/25/2017 14:48  pH, Arterial Latest Ref Range: 7.350 - 7.450  7.484 (H)  pCO2 arterial Latest Ref Range: 32.0 - 48.0 mmHg 38.4  pO2, Arterial Latest Ref Range: 83.0 - 108.0 mmHg 112 (H)  Bicarbonate Latest Ref Range: 20.0 - 28.0 mmol/L 29.0 (H)   Results for ORVILLA, TRUETT (MRN [id number]) as of 06/25/2017 19:03  Ref. Range 06/25/2017 13:06  WBC Latest Ref Range: 4.0 - 10.5 K/uL 4.6  RBC Latest Ref Range: 3.87 - 5.11 MIL/uL 3.98  Hemoglobin Latest Ref Range: 12.0 - 15.0 g/dL 10.5 (L)  HCT Latest Ref Range: 36.0 - 46.0 % 33.8 (L)  MCV Latest Ref Range: 78.0 - 100.0 fL 84.9  MCH Latest Ref Range: 26.0 - 34.0 pg 26.4  MCHC Latest Ref Range: 30.0 - 36.0 g/dL 31.1  RDW Latest Ref Range: 11.5 - 15.5 % 15.0  Platelets Latest Ref Range: 150 - 400 K/uL 276  Results for RANDALL, RAMPERSAD (MRN [id number]) as of 06/25/2017 19:03  Ref. Range 06/25/2017 13:06  Prothrombin Time Latest Ref Range: 11.4 - 15.2 seconds 14.3  INR Unknown 1.11   Results for ZABDI, MIS (MRN [id number]) as of 06/25/2017 19:03  Ref. Range 06/25/2017 13:06  Acetaminophen (Tylenol), S Latest Ref Range: 10 - 30 ug/mL <63 (L)  Salicylate Lvl Latest Ref Range: 2.8 - 30.0 mg/dL <7.0  Glucose Latest Ref Range: 65 - 99 mg/dL 177 (H)   Results for SHERRON, MAPP (MRN [id number]) as of 06/25/2017 19:03  Ref. Range 06/25/2017 12:53  Preg Test, Ur Latest Ref Range: NEGATIVE  NEGATIVE   Results for CORALINE, TALWAR (MRN [id number]) as of 06/25/2017 19:03  Ref. Range 06/25/2017 12:53  Appearance Latest Ref Range: CLEAR  CLEAR  Bilirubin Urine Latest Ref Range: NEGATIVE  NEGATIVE  Color, Urine Latest Ref Range: YELLOW  YELLOW  Glucose Latest Ref Range: NEGATIVE mg/dL 50 (A)  Hgb urine dipstick Latest Ref Range: NEGATIVE  NEGATIVE  Ketones, ur Latest Ref Range: NEGATIVE mg/dL NEGATIVE  Leukocytes, UA Latest Ref Range: NEGATIVE  NEGATIVE  Nitrite Latest Ref Range: NEGATIVE  NEGATIVE  pH Latest Ref Range: 5.0 - 8.0   6.0  Protein Latest Ref Range: NEGATIVE mg/dL 30 (A)  Specific Gravity, Urine Latest Ref Range: 1.005 - 1.030  1.019  Bacteria, UA Latest Ref Range: NONE SEEN  RARE (A)  Mucus Unknown PRESENT  RBC / HPF Latest Ref Range: 0 - 5 RBC/hpf NONE SEEN  Squamous Epithelial / LPF Latest Ref Range: NONE SEEN  0-5 (A)  WBC, UA Latest Ref Range: 0 - 5 WBC/hpf 0-5  Preg Test, Ur Latest Ref Range: NEGATIVE  NEGATIVE   Results for TAMAYA, PUN (MRN [id number]) as of 06/25/2017 19:03  Ref. Range 06/24/2017 19:56 06/25/2017 12:53  Amphetamines Latest Ref Range: NONE DETECTED  NONE DETECTED NONE DETECTED  Barbiturates Latest Ref Range: NONE DETECTED  NONE DETECTED NONE DETECTED  Benzodiazepines Latest Ref Range: NONE DETECTED  POSITIVE (A) POSITIVE (A)  Opiates Latest Ref Range: NONE DETECTED  NONE DETECTED POSITIVE (A)  COCAINE Latest Ref Range: NONE DETECTED  NONE DETECTED NONE DETECTED  Tetrahydrocannabinol Latest Ref Range: NONE DETECTED  POSITIVE (A) POSITIVE (A)     Xray  Chest 06-25-17 Endotracheal tube is seen in satisfactory position. Nasogastric catheter is noted extending into the stomach. Cardiac shadow is stable. The lungs are well aerated bilaterally. Improved aeration in the left lung is noted when compare with the previous exam. Postsurgical changes in the thoracolumbar spine are again seen.     Ekg 06-25-2017 Sinus rhythm, QRS and QTC normalized from initial EKG      Assessment and Plan:   27 y/o female         With    H/o Scoliosis, depression, Suicidal OD with buspar, amitryptiline,  polysubstance abuse- benzo, opitaes, THC Acute resp failure- intubated for airway protection- No evidence of aspiration      Neurology Goal RASS  -1 On propofol Psych Consult when extubated  Cardiology  Monitor QRS and QTc, doubt needs further bicarb continue iv fluids  Pulmonary  PRVC mode F/u CXR, ABG    GI /nutrition  NPO   Renal, fluids, electrolytes Correct lytes,  monitor   Hematology dvt prophlxs  Endocrinology Monitor sugars  Infectious diseases Head lice Rx- Permethrin  Lines -Devices 06-25-17  ETT, OG tube, Foley    Counseling Patient/Family:  None ghere at present; No answer in phone # listed    Code status: Full   Thank you for letting me participate in the care of your patient.  ^^^^^^^^^^ I  Have personally spent  60   Minutes  In the care of this Patient providing Critical care Services; Time includes review of chart, labs, imaging, coordinating care with other physicians and healthcare team members. Also includes time for frequent reevaluation and additional treatment implementation due to change in clinical condiiton of patient. Excludes time spent for Procedure and Teaching.   ^^^^^^^^^^  Note subject to typographical and grammatical errors;   Any formal questions or concerns about the content, text, or information contained within the body of this dictation should be directly addressed to the physician  for  clarification.   Evans Lance, MD Pulmonary and [facility] Pager: [phone]

## 2017-06-25 NOTE — ED Notes (Signed)
bair hugger applied.

## 2017-06-26 ENCOUNTER — Ambulatory Visit: Payer: Medicaid Other | Admitting: Family

## 2017-06-26 ENCOUNTER — Inpatient Hospital Stay (HOSPITAL_COMMUNITY): Payer: Medicaid Other

## 2017-06-26 LAB — POCT I-STAT 3, ART BLOOD GAS (G3+)
BICARBONATE: 24.8 mmol/L (ref 20.0–28.0)
O2 Saturation: 97 %
PH ART: 7.396 (ref 7.350–7.450)
Patient temperature: 98.6
TCO2: 26 mmol/L (ref 22–32)
pCO2 arterial: 40.4 mmHg (ref 32.0–48.0)
pO2, Arterial: 86 mmHg (ref 83.0–108.0)

## 2017-06-26 LAB — CBC
HCT: 30.9 % — ABNORMAL LOW (ref 36.0–46.0)
HEMOGLOBIN: 9.4 g/dL — AB (ref 12.0–15.0)
MCH: 26 pg (ref 26.0–34.0)
MCHC: 30.4 g/dL (ref 30.0–36.0)
MCV: 85.6 fL (ref 78.0–100.0)
Platelets: 210 10*3/uL (ref 150–400)
RBC: 3.61 MIL/uL — ABNORMAL LOW (ref 3.87–5.11)
RDW: 15.6 % — AB (ref 11.5–15.5)
WBC: 11.6 10*3/uL — ABNORMAL HIGH (ref 4.0–10.5)

## 2017-06-26 LAB — TRIGLYCERIDES: TRIGLYCERIDES: 77 mg/dL (ref <150)

## 2017-06-26 LAB — COMPREHENSIVE METABOLIC PANEL
ALT: 14 U/L (ref 14–54)
AST: 27 U/L (ref 15–41)
Albumin: 3.2 g/dL — ABNORMAL LOW (ref 3.5–5.0)
Alkaline Phosphatase: 59 U/L (ref 38–126)
Anion gap: 9 (ref 5–15)
BUN: 8 mg/dL (ref 6–20)
CALCIUM: 8.5 mg/dL — AB (ref 8.9–10.3)
CO2: 23 mmol/L (ref 22–32)
CREATININE: 0.44 mg/dL (ref 0.44–1.00)
Chloride: 110 mmol/L (ref 101–111)
Glucose, Bld: 108 mg/dL — ABNORMAL HIGH (ref 65–99)
Potassium: 3.5 mmol/L (ref 3.5–5.1)
Sodium: 142 mmol/L (ref 135–145)
Total Bilirubin: 0.8 mg/dL (ref 0.3–1.2)
Total Protein: 6 g/dL — ABNORMAL LOW (ref 6.5–8.1)

## 2017-06-26 LAB — MAGNESIUM: MAGNESIUM: 1.8 mg/dL (ref 1.7–2.4)

## 2017-06-26 LAB — HIV ANTIBODY (ROUTINE TESTING W REFLEX): HIV Screen 4th Generation wRfx: NONREACTIVE

## 2017-06-26 MED ORDER — HALOPERIDOL LACTATE 5 MG/ML IJ SOLN
1.0000 mg | INTRAMUSCULAR | Status: DC | PRN
  Administered 2017-06-27 – 2017-06-29 (×7): 1 mg via INTRAVENOUS
  Filled 2017-06-26 (×7): qty 1

## 2017-06-26 NOTE — Progress Notes (Signed)
PULMONARY / CRITICAL CARE MEDICINE   Name: Judy Graham MRN: [id number] DOB: [birth year]    ADMISSION DATE:  06/25/2017  REFERRING MD:  [facility] ED  CHIEF COMPLAINT: Obtundation, drug overdose  HISTORY OF PRESENT ILLNESS:   Took  amitriptyline and buspar today  27 y/o female with scoliosis, depression, suicidally overdosed on amitryptiline and buspar, then was found unresponsive, in  [facility] ED, she was intubated for airway protection, got charcoal, Na Bicarb 3 amp, iv fluids; Poison control was in contact with ED and advised Rx.  On propofol for sedation on vent. No family here. Lice and fleas noted on scalp hair. Recently seen by Psych.    PAST MEDICAL HISTORY :  She  has a past medical history of Scoliosis.  PAST SURGICAL HISTORY: She  has a past surgical history that includes Back surgery and Knee surgery.  Allergies  Allergen Reactions  . Clindamycin/Lincomycin Anaphylaxis and Swelling  . Gabapentin Shortness Of Breath and Swelling  . Flexeril [Cyclobenzaprine]     Restless leg syndrome, numbness  . Gluten Meal     No current facility-administered medications on file prior to encounter.    Current Outpatient Medications on File Prior to Encounter  Medication Sig  . amitriptyline (ELAVIL) 25 MG tablet Take 25-50 mg by mouth at bedtime.  . diazepam (VALIUM) 5 MG tablet Take 5 mg by mouth daily as needed for anxiety.   Marland Kitchen HYDROcodone-acetaminophen (NORCO/VICODIN) 5-325 MG tablet Take 1 tablet by mouth every 6 (six) hours as needed for moderate pain.    FAMILY HISTORY:  Her indicated that her mother is deceased. She indicated that her father is alive. She indicated that her maternal grandmother is deceased. She indicated that her paternal grandmother is deceased.   SOCIAL HISTORY: She  reports that she has been smoking cigarettes.  She has been smoking about 0.50 packs per day. she has never used smokeless tobacco. She reports that she drinks alcohol. She reports that she  does not use drugs.  REVIEW OF SYSTEMS:   Unable to obtain  SUBJECTIVE:  Tolerating pressure support 10, on propofol and sedated.  She becomes quite agitated when she is on less sedation, stimulated. She has low tidal volumes when her pressure support is decreased.  VITAL SIGNS: BP 119/87   Pulse 90   Temp 99 F (37.2 C)   Resp 18   Ht 5\' 7"  (1.702 m)   Wt 45.4 kg (100 lb 1.4 oz)   LMP 06/10/2017   SpO2 98%   BMI 15.68 kg/m   HEMODYNAMICS:    VENTILATOR SETTINGS: Vent Mode: PSV;CPAP FiO2 (%):  [40 %] 40 % Set Rate:  [16 bmp] 16 bmp Vt Set:  [350 mL] 350 mL PEEP:  [5 cmH20] 5 cmH20 Pressure Support:  [10 cmH20] 10 cmH20 Plateau Pressure:  [13 cmH20-14 cmH20] 14 cmH20  INTAKE / OUTPUT: I/O last 3 completed shifts: In: 2252.3 [I.V.:852.3; IV Piggyback:1400] Out: 1630 [Urine:1630]  PHYSICAL EXAMINATION: General: Ill-appearing thin woman chemically ventilated Neuro: Sedated, does not wake her respond to voice or stimulation.  She did grimace and move some with a sternal rub HEENT: Endotracheal tube in place, she has lice and is wearing a bonnet  Cardiovascular: Cardiac regular, distant Lungs: Coarse breath sounds bilaterally Abdomen: Soft, benign Musculoskeletal: Scoliosis, cachectic Skin: Scattered papules  LABS:  BMET Recent Labs  Lab 06/25/17 1306 06/25/17 2135 06/26/17 0559  NA 139 143 142  K 2.2* 3.2* 3.5  CL 106 113* 110  CO2 23 23  23  BUN 16 6 8   CREATININE 0.55 0.42* 0.44  GLUCOSE 177* 105* 108*    Electrolytes Recent Labs  Lab 06/25/17 1306 06/25/17 1307 06/25/17 2135 06/26/17 0559  CALCIUM 8.7*  --  8.0* 8.5*  MG  --  1.8 1.7 1.8  PHOS  --   --  2.7  --     CBC Recent Labs  Lab 06/25/17 1306 06/25/17 2135 06/26/17 0559  WBC 4.6 9.3 11.6*  HGB 10.5* 9.3* 9.4*  HCT 33.8* 30.2* 30.9*  PLT 276 224 210    Coag's Recent Labs  Lab 06/25/17 1306  INR 1.11    Sepsis Markers No results for input(s): LATICACIDVEN,  PROCALCITON, O2SATVEN in the last 168 hours.  ABG Recent Labs  Lab 06/25/17 1448 06/25/17 2031 06/26/17 0413  PHART 7.484* 7.374 7.396  PCO2ART 38.4 42.2 40.4  PO2ART 112* 187.0* 86.0    Liver Enzymes Recent Labs  Lab 06/25/17 1306 06/25/17 2135 06/26/17 0559  AST 34 25 27  ALT 19 16 14   ALKPHOS 64 59 59  BILITOT 0.7 0.5 0.8  ALBUMIN 4.0 3.4* 3.2*    Cardiac Enzymes No results for input(s): TROPONINI, PROBNP in the last 168 hours.  Glucose Recent Labs  Lab 06/25/17 1643  GLUCAP 98    Imaging Dg Chest Port 1 View  Result Date: 06/26/2017 CLINICAL DATA:  Acute respiratory failure. EXAM: PORTABLE CHEST 1 VIEW COMPARISON:  06/25/2017 FINDINGS: The patient is rotated to the left. Endotracheal tube terminates at the inferior margin of the clavicular heads, well above the carina. Enteric tube courses into the abdomen with tip not imaged. The cardiomediastinal silhouette is grossly unchanged allowing for patient rotation. There is new streaky retrocardiac opacity in the left lower lobe. Left upper lobe opacity appears improved. The right lung remains grossly clear. No pleural effusion or pneumothorax is identified. IMPRESSION: New left lower lobe opacity, possibly atelectasis. Electronically Signed   By: Sebastian Ache M.D.   On: 06/26/2017 07:38   Dg Chest Port 1 View  Result Date: 06/25/2017 CLINICAL DATA:  Check endotracheal tube placement EXAM: PORTABLE CHEST 1 VIEW COMPARISON:  06/25/2017 FINDINGS: Endotracheal tube is seen in satisfactory position. Nasogastric catheter is noted extending into the stomach. Cardiac shadow is stable. The lungs are well aerated bilaterally. Improved aeration in the left lung is noted when compare with the previous exam. Postsurgical changes in the thoracolumbar spine are again seen. IMPRESSION: Tubes and lines as described. Previously seen density over the left upper lobe has resolved in the interval. No new focal abnormality is noted.  Electronically Signed   By: Alcide Clever M.D.   On: 06/25/2017 19:14     STUDIES:   CULTURES:  ANTIBIOTICS: Permethrin shampoo 3/10  SIGNIFICANT EVENTS:  LINES/TUBES: ET tube 3/10 >>   DISCUSSION:  ASSESSMENT / PLAN:  NEUROLOGIC A:   Intentional ingestion, toxic metabolic encephalopathy Depression with active suicidality P:   RASS goal: 0 to -1 Wean propofol 3/11, goal wakeup assessment, possible spontaneous breathing trial  PULMONARY A: Acute respiratory failure due to altered mental status Left lower lobe atelectasis versus infiltrate P:   Continue PRVC Assess for wake up and possible spontaneous breathing trial, goal extubated for mental status will allow Defer antibiotics at this time, if evidence for evolving pneumonia then will initiate   CARDIOVASCULAR A: \ Amitriptyline ingestion, at risk arrhythmia P:  Follow QRS, QTC Bicarbonate completed/ held  RENAL A:   No acute issues currently P:   Follow BMP,  urine output  GASTROINTESTINAL A:   SU prophylaxis Nutrition P:   N.p.o. currently Start tube feeding if we do not believe she is going to be extubated 3/11 Pepcid as ordered  HEMATOLOGIC A:   DVT prophylaxis Leukocytosis, suspect stress reaction versus infectious process P:  Follow CBC, follow clinically  INFECTIOUS A:   Found to have lice At risk left lower lobe pneumonia P:   Permethrin Follow clinically off of antibiotics  ENDOCRINE A:   At risk hyperglycemia P:   Follow CBG  FAMILY  - Updates: No family present at bedside 3/11  - Inter-disciplinary family meet or Palliative Care meeting due by: 07/02/17   Independent CC time 34 minutes  Levy Pupaobert Byrum, MD, PhD 06/26/2017, 3:02 PM [facility] [phone] or if no answer [phone]

## 2017-06-26 NOTE — Progress Notes (Signed)
eLink Physician-Brief Progress Note Patient Name: Judy Graham DOB: [birth year] MRN: [id number]   Date of Service  06/26/2017  HPI/Events of Note  Delirium/Severe Agitation - Patient kicking staff.  Request for physical and sedation. QTc interval = 0.41 seconds.   eICU Interventions  Will order:  1. Bilateral soft wrist and ankle restraints.  2. Haldol 1 mg IV Q 3 hours PRN delirium/agitation.  3. Monitor QTc interval Q 6 hours. Notify MD if QTc interval > 500 milliseconds.      Intervention Category Major Interventions: Delirium, psychosis, severe agitation - evaluation and management  Sommer,Steven Eugene 06/26/2017, 11:56 PM

## 2017-06-26 NOTE — Procedures (Signed)
Extubation Procedure Note  Patient Details:   Name: Judy Graham DOB: 1990/11/16 MRN: [id number]   Airway Documentation:     Evaluation  O2 sats: stable throughout Complications: No apparent complications Patient did tolerate procedure well. Bilateral Breath Sounds: Clear   Yes  4l/min [place] Incentive spirometer instructed 500ml  Judy Graham, [name] 06/26/2017, 3:57 PM

## 2017-06-27 ENCOUNTER — Inpatient Hospital Stay (HOSPITAL_COMMUNITY): Payer: Medicaid Other

## 2017-06-27 LAB — BASIC METABOLIC PANEL
Anion gap: 6 (ref 5–15)
BUN: 6 mg/dL (ref 6–20)
CHLORIDE: 110 mmol/L (ref 101–111)
CO2: 25 mmol/L (ref 22–32)
CREATININE: 0.43 mg/dL — AB (ref 0.44–1.00)
Calcium: 8.2 mg/dL — ABNORMAL LOW (ref 8.9–10.3)
GFR calc Af Amer: >60 mL/min (ref >60)
GFR calc non Af Amer: >60 mL/min (ref >60)
Glucose, Bld: 88 mg/dL (ref 65–99)
POTASSIUM: 3.1 mmol/L — AB (ref 3.5–5.1)
Sodium: 141 mmol/L (ref 135–145)

## 2017-06-27 LAB — MAGNESIUM: MAGNESIUM: 2 mg/dL (ref 1.7–2.4)

## 2017-06-27 LAB — CBC
HCT: 27.8 % — ABNORMAL LOW (ref 36.0–46.0)
HEMOGLOBIN: 8.5 g/dL — AB (ref 12.0–15.0)
MCH: 26.2 pg (ref 26.0–34.0)
MCHC: 30.6 g/dL (ref 30.0–36.0)
MCV: 85.8 fL (ref 78.0–100.0)
Platelets: 185 10*3/uL (ref 150–400)
RBC: 3.24 MIL/uL — ABNORMAL LOW (ref 3.87–5.11)
RDW: 15.6 % — AB (ref 11.5–15.5)
WBC: 7.2 10*3/uL (ref 4.0–10.5)

## 2017-06-27 MED ORDER — KCL-LACTATED RINGERS 20 MEQ/L IV SOLN
INTRAVENOUS | Status: DC
  Filled 2017-06-27: qty 1000

## 2017-06-27 MED ORDER — POTASSIUM CHLORIDE 20 MEQ/15ML (10%) PO SOLN
40.0000 meq | Freq: Two times a day (BID) | ORAL | Status: AC
  Filled 2017-06-27 (×2): qty 30

## 2017-06-27 MED ORDER — LACTATED RINGERS IV SOLN
INTRAVENOUS | Status: DC
  Administered 2017-06-28: 13:00:00 via INTRAVENOUS
  Filled 2017-06-27 (×7): qty 1000

## 2017-06-27 NOTE — Progress Notes (Signed)
Pt refused to take PO potassium and pepcid after several attempts, pt is AOx4 but states they "don't taste good." Notified ELink MD, received orders to adjust IVF to contain potassium.  Pt also continues to request pain medications and valium. VSS, no signs of distress. Will continue to monitor.

## 2017-06-27 NOTE — Progress Notes (Signed)
Poison Control called for an update on the patient.  They have signed off and will no longer be following the patient at this time.

## 2017-06-27 NOTE — Progress Notes (Addendum)
PULMONARY / CRITICAL CARE MEDICINE   Name: Judy Graham MRN: [id number] DOB: May 09, 1990    ADMISSION DATE:  06/25/2017  REFERRING MD:  [facility] ED  CHIEF COMPLAINT: Obtundation, drug overdose  HISTORY OF PRESENT ILLNESS:   Took  amitriptyline and buspar today UDS 3/10 + for Benzos. Opiates, Tetrahydrocannabinol  27 y/o female with scoliosis, depression, suicidally overdosed on amitryptiline and buspar, then was found unresponsive, in  [facility] ED, she was intubated for airway protection, got charcoal, Na Bicarb 3 amp, iv fluids; Poison control was in contact with ED and advised Rx. On propofol for sedation on vent.. Lice and fleas noted on scalp hair. Recently seen by Psych.   SUBJECTIVE:  Extubated x 24 hours. She is requesting pain medication. Restraints are off at present   VITAL SIGNS: BP (!) 143/105   Pulse 65   Temp 97.7 F (36.5 C)   Resp 16   Ht 5\' 7"  (1.702 m)   Wt 100 lb 1.4 oz (45.4 kg)   LMP 06/10/2017   SpO2 100%   BMI 15.68 kg/m   HEMODYNAMICS:    VENTILATOR SETTINGS:    INTAKE / OUTPUT: I/O last 3 completed shifts: In: 1499.9 [I.V.:1499.9] Out: 2300 [Urine:1400; Emesis/NG output:900]  PHYSICAL EXAMINATION: General:Thin female with poor dentation, awake and alert, requesting pain medication Neuro: Awake and alert, MAE x 4 HEENT:NCAT  Cardiovascular: S1, S2, RRR, NO RMG Lungs: Coarse breath sounds bilaterally, diminished per bases Abdomen: Soft, NT, ND Musculoskeletal: Scoliosis, cachectic Skin: Scattered papules, pale, warm and dry  LABS:  BMET Recent Labs  Lab 06/25/17 2135 06/26/17 0559 06/27/17 0322  NA 143 142 141  K 3.2* 3.5 3.1*  CL 113* 110 110  CO2 23 23 25   BUN 6 8 6   CREATININE 0.42* 0.44 0.43*  GLUCOSE 105* 108* 88    Electrolytes Recent Labs  Lab 06/25/17 2135 06/26/17 0559 06/27/17 0322  CALCIUM 8.0* 8.5* 8.2*  MG 1.7 1.8 2.0  PHOS 2.7  --   --     CBC Recent Labs  Lab 06/25/17 2135 06/26/17 0559  06/27/17 0322  WBC 9.3 11.6* 7.2  HGB 9.3* 9.4* 8.5*  HCT 30.2* 30.9* 27.8*  PLT 224 210 185    Coag's Recent Labs  Lab 06/25/17 1306  INR 1.11    Sepsis Markers No results for input(s): LATICACIDVEN, PROCALCITON, O2SATVEN in the last 168 hours.  ABG Recent Labs  Lab 06/25/17 1448 06/25/17 2031 06/26/17 0413  PHART 7.484* 7.374 7.396  PCO2ART 38.4 42.2 40.4  PO2ART 112* 187.0* 86.0    Liver Enzymes Recent Labs  Lab 06/25/17 1306 06/25/17 2135 06/26/17 0559  AST 34 25 27  ALT 19 16 14   ALKPHOS 64 59 59  BILITOT 0.7 0.5 0.8  ALBUMIN 4.0 3.4* 3.2*    Cardiac Enzymes No results for input(s): TROPONINI, PROBNP in the last 168 hours.  Glucose Recent Labs  Lab 06/25/17 1643  GLUCAP 98    Imaging Dg Chest Port 1 View  Result Date: 06/27/2017 CLINICAL DATA:  Acute respiratory failure, shortness of breath EXAM: PORTABLE CHEST 1 VIEW COMPARISON:  Portable chest x-ray of 06/26/2017 FINDINGS: The opacity at the left lung base remains medially and is worrisome for a focus of pneumonia. Mild volume loss remains at the right lung base. No definite pleural effusion is seen. Harrington rods are noted overlying the thoracolumbar spine. IMPRESSION: Persistent medial left basilar opacity most consistent with pneumonia. Recommend continued follow-up. Electronically Signed   By: Lucienne Minks.D.  On: 06/27/2017 08:59     STUDIES:   CULTURES: MRSA negative  ANTIBIOTICS: Permethrin shampoo 3/10  SIGNIFICANT EVENTS:  LINES/TUBES: ET tube 3/10 >>   DISCUSSION:  ASSESSMENT / PLAN:  NEUROLOGIC A:   Intentional ingestion, toxic metabolic encephalopathy Depression with active suicidality P:   RASS goal: 0 to -1 Awake and alert, MAE x 4 Plan-  Minimize sedation Neuro checks per floor Psych consult now extubated Safety sitters now extubated  PULMONARY A: Acute respiratory failure due to altered mental status Left lower lobe atelectasis versus  infiltrate Extubated 3/11>> tolerating Carlos Persistent medial left basilar opacity most consistent with pneumonia T max 99.7, WBC 7.2 P:   Aggressive pulmonary Toilet,  IS Q 1 while awake Mobilize CXR >> 3/13 Consider ABX as low grade temp and left basilar opacity. Bedside Swallow eval by RN   CARDIOVASCULAR A:  Amitriptyline ingestion, at risk arrhythmia P:  Tele monitoring Follow QRS, QTC Bicarbonate completed/ held EKG prn  RENAL A:   Hypokalemia>> Replete 3/12 P:   Follow BMP, urine output  GASTROINTESTINAL A:   SU prophylaxis Nutrition P:   Diet if passes bedside swallow Pepcid as ordered  HEMATOLOGIC A:  Anemia  DVT prophylaxis Leukocytosis, suspect stress reaction versus infectious process>> normalizing P:  Follow CBC Monitor for any obvious source of bleeding Transfuse for HGB < 7  INFECTIOUS A:   Found to have lice At risk left lower lobe pneumonia P:   Permethrin Consider PCT  Consider ABX  Follow CXR Trend Fever/ WBC curve  ENDOCRINE A:   At risk hyperglycemia P:   Follow CBG  FAMILY  - Updates: No family present at bedside 3/12. Pt. Updated.  - Inter-disciplinary family meet or Palliative Care meeting due by: 07/02/17   Bevelyn NgoSarah F. Groce, AGACNP-BC 06/27/2017, 12:48 PM [facility] [phone] or if no answer [phone]

## 2017-06-27 NOTE — Progress Notes (Signed)
CSW spoke with pt's ex significant other Harrison Mons(Blake) and was informed that they are separated. CSW was informed that pt's 27 year old child is with him and that [facility] [place] [facility] [place] [place] already been out to approve safety plan in place for child as well as childcare that Harrison MonsBlake has set up for pt at this time. CSW will continue to follow this pt for further needs at this time.    Claude MangesKierra S. Devonta Blanford, MSW, LCSW-A Emergency Department Clinical Social Worker [phone]

## 2017-06-27 NOTE — Progress Notes (Signed)
PT Cancellation Note  Patient Details Name: Judy Graham MRN: [id number] DOB: 02/13/1991   Cancelled Treatment:    Reason Eval/Treat Not Completed: Patient declined, no reason specified.  Pt just awake for nursing, but won't open eyes for therapist.  Will see 3/13 as able. 06/27/2017  [place] [name], PT [phone]  (pager)   [name] 06/27/2017, 3:40 PM

## 2017-06-28 ENCOUNTER — Inpatient Hospital Stay (HOSPITAL_COMMUNITY): Payer: Medicaid Other

## 2017-06-28 DIAGNOSIS — F419 Anxiety disorder, unspecified: Secondary | ICD-10-CM

## 2017-06-28 DIAGNOSIS — R4587 Impulsiveness: Secondary | ICD-10-CM

## 2017-06-28 DIAGNOSIS — F32A Depression, unspecified: Secondary | ICD-10-CM

## 2017-06-28 DIAGNOSIS — F329 Major depressive disorder, single episode, unspecified: Secondary | ICD-10-CM

## 2017-06-28 DIAGNOSIS — T43012A Poisoning by tricyclic antidepressants, intentional self-harm, initial encounter: Principal | ICD-10-CM

## 2017-06-28 DIAGNOSIS — F17210 Nicotine dependence, cigarettes, uncomplicated: Secondary | ICD-10-CM

## 2017-06-28 DIAGNOSIS — J9600 Acute respiratory failure, unspecified whether with hypoxia or hypercapnia: Secondary | ICD-10-CM

## 2017-06-28 DIAGNOSIS — R450 Nervousness: Secondary | ICD-10-CM

## 2017-06-28 LAB — CBC
HEMATOCRIT: 29 % — AB (ref 36.0–46.0)
Hemoglobin: 9.2 g/dL — ABNORMAL LOW (ref 12.0–15.0)
MCH: 27 pg (ref 26.0–34.0)
MCHC: 31.7 g/dL (ref 30.0–36.0)
MCV: 85 fL (ref 78.0–100.0)
PLATELETS: 225 10*3/uL (ref 150–400)
RBC: 3.41 MIL/uL — ABNORMAL LOW (ref 3.87–5.11)
RDW: 15.4 % (ref 11.5–15.5)
WBC: 7.1 10*3/uL (ref 4.0–10.5)

## 2017-06-28 LAB — BASIC METABOLIC PANEL
ANION GAP: 8 (ref 5–15)
BUN: 7 mg/dL (ref 6–20)
CO2: 25 mmol/L (ref 22–32)
CREATININE: 0.5 mg/dL (ref 0.44–1.00)
Calcium: 8.3 mg/dL — ABNORMAL LOW (ref 8.9–10.3)
Chloride: 106 mmol/L (ref 101–111)
GFR calc Af Amer: >60 mL/min (ref >60)
Glucose, Bld: 82 mg/dL (ref 65–99)
Potassium: 3.2 mmol/L — ABNORMAL LOW (ref 3.5–5.1)
Sodium: 139 mmol/L (ref 135–145)

## 2017-06-28 MED ORDER — POTASSIUM CHLORIDE CRYS ER 20 MEQ PO TBCR
40.0000 meq | EXTENDED_RELEASE_TABLET | Freq: Once | ORAL | Status: AC
  Administered 2017-06-28: 40 meq via ORAL
  Filled 2017-06-28: qty 2

## 2017-06-28 NOTE — Plan of Care (Signed)
  Progressing Activity: Risk for activity intolerance will decrease 06/28/2017 1014 - Progressing by Leanord HawkingFields, Antwaun Buth R, RN Nutrition: Adequate nutrition will be maintained 06/28/2017 1014 - Progressing by Leanord HawkingFields, Holden Maniscalco R, RN Safety: Ability to remain free from injury will improve 06/28/2017 1014 - Progressing by Leanord HawkingFields, Tytionna Cloyd R, RN Skin Integrity: Risk for impaired skin integrity will decrease 06/28/2017 1014 - Progressing by Leanord HawkingFields, Kemya Shed R, RN   Not Progressing Coping: Level of anxiety will decrease 06/28/2017 1014 - Not Progressing by Leanord HawkingFields, Kerron Sedano R, RN Pain Managment: General experience of comfort will improve 06/28/2017 1014 - Not Progressing by Leanord HawkingFields, Kennard Fildes R, RN

## 2017-06-28 NOTE — Consult Note (Addendum)
[facility] Face-to-Face Psychiatry Consult   Reason for Consult:  Suicide attempt  Referring Physician:  Dr. Pati Gallo  Patient Identification: Judy Graham MRN:  [id number] Principal Diagnosis: Depression Diagnosis:   Patient Active Problem List   Diagnosis Date Noted  . Tricyclic overdose, intentional self-harm, initial encounter ([place]) [T43.012A] 06/25/2017  . Acute respiratory failure ([place]) [J96.00] 06/25/2017  . Chronic hepatitis C without hepatic coma ([place]) [B18.2] 04/28/2017  . 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 Abdominal pain [R10.9] 07/01/2016  . Chronic headache [R51] 06/17/2016  . Bipolar and related disorder ([place]) [F31.9] 06/05/2015  . IUGR (intrauterine growth restriction) affecting care of mother [O36.5990] 08/14/2014  . Cigarette smoker [F17.210]   . MRSA (methicillin resistant staph aureus) culture positive [Z22.322] 05/26/2014  . Abnormal findings on antenatal screening [O28.9]   . Elevated AFP [R77.2]   . Choroid plexus cyst of fetus [O35.3PI9]   . Abnormal MSAFP (maternal serum alpha-fetoprotein), elevated [O28.0]   . High risk pregnancy with high inhibin [O28.8, O09.899]   . Choroid plexus cyst [G93.0]   . UTI (urinary tract infection) in pregnancy in second trimester [O23.42] 03/08/2014  . Marijuana use [F12.90] 03/08/2014  . Abnormal quad screen [O28.0] 03/08/2014  . Late prenatal care affecting pregnancy in second trimester, antepartum [O09.32] 03/04/2014  . Scoliosis [M41.9] 03/04/2014  . Anxiety disorder [F41.9] 09/05/2013  . Adjustment disorder with mixed emotional features [F43.29] 09/01/2013  . S/P spinal fusion [Z98.1] 08/29/2011    Total Time spent with patient: 1 hour  Subjective:   Judy Graham is a 27 y.o. female patient admitted with suicide attempt by overdose with Amitriptyline and Buspar.  HPI:  Per chart review, patient was admitted with suicide attempt by overdose with Amitriptyline and  Buspar. She was found unresponsive and required intubation for airway protection. She also received charcoal and bicarb. She has a history of depression. She lives with her ex-boyfriend and 54 year old son. She was found to have lice and flees. She is underweight. There is an open CPS case.   On interview, Judy Graham reports that she has been depressed for the past year following the death of her mother from a seizure complicated by a MI. She reports that her fiance no longer wants her to live at his house because he is "tired of her mental health issues." She reports poorly managed anxiety and constant back pain. She had plans to follow up with a pain management doctor on Monday. She takes Valium 5 mg TID PRN for anxiety. It was prescribed to her 1 week ago by her pain management doctor. She also takes Norco. PMP indicates a short term supply for both medications. She overdosed on Amitriptyline and Buspar while she was at home alone. She did not reach out to anyone for help and believes that her fiance found her when he returned home. She took a handful of these medications. They were old prescriptions. She denies taking psychotropic medications besides Valium at this time. She reports current SI and that there is nothing to live for. She denies a prior history of suicide attempts. She endorses AVH. She reports seeing ants crawling on objects for 2 months. She also reports seeing objects move in a "wave." She hears static and reports paranoia that the cops are after her. She is aware that this belief is irrational. She denies HI. She reports poor sleep and poor appetite. She has lost a significant amount of weight but is unsure how much.  She reports nausea when trying to eat. She denies a history of disorderly eating or restricting calories. She reports a history of manic symptoms (decrreased need for sleep, increased energy and pressured speech).   Past Psychiatric History: Borderline personality disorder,  bipolar disorder "with schizophrenic tendencies."   Risk to Self: Is patient at risk for suicide?: Yes Risk to Others:  None. Denies HI.  Prior Inpatient Therapy:  She was hospitalized at [facility] for SI several years ago. Prior Outpatient Therapy:   Valium is prescribed by her pain management doctor.   Past Medical History:  Past Medical History:  Diagnosis Date  . Scoliosis     Past Surgical History:  Procedure Laterality Date  . BACK SURGERY    . KNEE SURGERY     Family History:  Family History  Problem Relation Age of Onset  . Cancer Mother   . Cancer Maternal Grandmother   . Diabetes Paternal Grandmother   . Stroke Paternal Grandmother   . Hypertension Father    Family Psychiatric  History: Mother-bipolar disorder and father-borderline personality disorder.   Social History:  Social History   Substance and Sexual Activity  Alcohol Use Yes   Comment: occ     Social History   Substance and Sexual Activity  Drug Use No    Social History   Socioeconomic History  . Marital status: Single    Spouse name: None  . Number of children: None  . Years of education: None  . Highest education level: None  Social Needs  . Financial resource strain: None  . Food insecurity - worry: None  . Food insecurity - inability: None  . Transportation needs - medical: None  . Transportation needs - non-medical: None  Occupational History  . None  Tobacco Use  . Smoking status: Current Every Day Smoker    Packs/day: 0.50    Types: Cigarettes  . Smokeless tobacco: Never Used  Substance and Sexual Activity  . Alcohol use: Yes    Comment: occ  . Drug use: No  . Sexual activity: Yes    Birth control/protection: None  Other Topics Concern  . None  Social History Narrative  . None   Additional Social History: She lives at home with her fiance and 2 y/o son. She reports that he recently asked her to move out. She is unemployed. She reports marijuana use in the past and  denies current illicit substance or alcohol use. She smokes 1 ppd x 11 years.    Allergies:   Allergies  Allergen Reactions  . Clindamycin/Lincomycin Anaphylaxis and Swelling  . Gabapentin Shortness Of Breath and Swelling  . Flexeril [Cyclobenzaprine]     Restless leg syndrome, numbness  . Gluten Meal     Labs:  Results for orders placed or performed during the hospital encounter of 06/25/17 (from the past 48 hour(s))  Basic metabolic panel     Status: Abnormal   Collection Time: 06/27/17  3:22 AM  Result Value Ref Range   Sodium 141 135 - 145 mmol/L   Potassium 3.1 (L) 3.5 - 5.1 mmol/L   Chloride 110 101 - 111 mmol/L   CO2 25 22 - 32 mmol/L   Glucose, Bld 88 65 - 99 mg/dL   BUN 6 6 - 20 mg/dL   Creatinine, Ser 0.43 (L) 0.44 - 1.00 mg/dL   Calcium 8.2 (L) 8.9 - 10.3 mg/dL   GFR calc non Af Amer >60 >60 mL/min   GFR calc Af Amer >60 >  60 mL/min    Comment: (NOTE) The eGFR has been calculated using the CKD EPI equation. This calculation has not been validated in all clinical situations. eGFR's persistently <60 mL/min signify possible Chronic Kidney Disease.    Anion gap 6 5 - 15    Comment: Performed at [facility] [address], [place], [place] [address]  Magnesium     Status: None   Collection Time: 06/27/17  3:22 AM  Result Value Ref Range   Magnesium 2.0 1.7 - 2.4 mg/dL    Comment: Performed at [facility], [place] [address], [place], [place] [address]  CBC     Status: Abnormal   Collection Time: 06/27/17  3:22 AM  Result Value Ref Range   WBC 7.2 4.0 - 10.5 K/uL   RBC 3.24 (L) 3.87 - 5.11 MIL/uL   Hemoglobin 8.5 (L) 12.0 - 15.0 g/dL   HCT 27.8 (L) 36.0 - 46.0 %   MCV 85.8 78.0 - 100.0 fL   MCH 26.2 26.0 - 34.0 pg   MCHC 30.6 30.0 - 36.0 g/dL   RDW 15.6 (H) 11.5 - 15.5 %   Platelets 185 150 - 400 K/uL    Comment: Performed at [facility], [place] [address] [place], [place] [address]  CBC     Status: Abnormal   Collection Time: 06/28/17  3:22  AM  Result Value Ref Range   WBC 7.1 4.0 - 10.5 K/uL   RBC 3.41 (L) 3.87 - 5.11 MIL/uL   Hemoglobin 9.2 (L) 12.0 - 15.0 g/dL   HCT 29.0 (L) 36.0 - 46.0 %   MCV 85.0 78.0 - 100.0 fL   MCH 27.0 26.0 - 34.0 pg   MCHC 31.7 30.0 - 36.0 g/dL   RDW 15.4 11.5 - 15.5 %   Platelets 225 150 - 400 K/uL    Comment: Performed at [facility], [place] [address], [place], [place] [address]  Basic metabolic panel     Status: Abnormal   Collection Time: 06/28/17  3:22 AM  Result Value Ref Range   Sodium 139 135 - 145 mmol/L   Potassium 3.2 (L) 3.5 - 5.1 mmol/L   Chloride 106 101 - 111 mmol/L   CO2 25 22 - 32 mmol/L   Glucose, Bld 82 65 - 99 mg/dL   BUN 7 6 - 20 mg/dL   Creatinine, Ser 0.50 0.44 - 1.00 mg/dL   Calcium 8.3 (L) 8.9 - 10.3 mg/dL   GFR calc non Af Amer >60 >60 mL/min   GFR calc Af Amer >60 >60 mL/min    Comment: (NOTE) The eGFR has been calculated using the CKD EPI equation. This calculation has not been validated in all clinical situations. eGFR's persistently <60 mL/min signify possible Chronic Kidney Disease.    Anion gap 8 5 - 15    Comment: Performed at [facility] [address], [place],  [address]    Current Facility-Administered Medications  Medication Dose Route Frequency Provider Last Rate Last Dose  . 0.9 %  sodium chloride infusion  250 mL Intravenous PRN Sivakumar, Siva P, MD      . acetaminophen (TYLENOL) tablet 650 mg  650 mg Oral Q4H PRN Sivakumar, Siva P, MD      . famotidine (PEPCID) 40 MG/5ML suspension 20 mg  20 mg Per Tube BID Sivakumar, Siva P, MD   20 mg at 06/26/17 1056  . haloperidol lactate (HALDOL) injection 1 mg  1  mg Intravenous Q3H PRN Anders Simmonds, MD   1 mg at 06/27/17 2153  . heparin injection 5,000 Units  5,000 Units Subcutaneous Q12H Evans Lance, MD   5,000 Units at 06/28/17 0925  . lactated ringers 1,000 mL with potassium chloride 20 mEq infusion   Intravenous Continuous Sivakumar, Siva P, MD 75 mL/hr at 06/28/17  1000      Musculoskeletal: Strength & Muscle Tone: decreased due to physical deconditioning.  Gait & Station: UTA since patient was lying in bed. Patient leans: N/A  Psychiatric Specialty Exam: Physical Exam  Nursing note and vitals reviewed. Constitutional: She is oriented to person, place, and time. She appears well-developed.  Malnourished  HENT:  Head: Normocephalic and atraumatic.  Neck: Normal range of motion.  Respiratory: Effort normal.  Musculoskeletal: Normal range of motion.  Neurological: She is alert and oriented to person, place, and time.  Skin: No rash noted.  Psychiatric: Her speech is normal and behavior is normal. Thought content normal. Her mood appears anxious. Cognition and memory are normal. She expresses impulsivity. She exhibits a depressed mood.    Review of Systems  Cardiovascular: Positive for chest pain.  Gastrointestinal: Positive for constipation and nausea. Negative for abdominal pain, diarrhea and vomiting.  Psychiatric/Behavioral: Positive for depression, hallucinations (AVH) and suicidal ideas. Negative for substance abuse. The patient is nervous/anxious and has insomnia.   All other systems reviewed and are negative.   Blood pressure (!) 118/93, pulse (!) 107, temperature 98.6 F (37 C), resp. rate (!) 26, height _0  (1.702 m), weight 43.9 kg (96 lb 12.5 oz), last menstrual period 06/10/2017, SpO2 97 %, unknown if currently breastfeeding.Body mass index is 15.16 kg/m.  General Appearance: Disheveled, young, malnourished, Caucasian female, wearing a hospital gown and lying in bed. NAD.   Eye Contact:  Good  Speech:  Clear and Coherent and Normal Rate  Volume:  Normal  Mood:  Anxious and Depressed  Affect:  Congruent  Thought Process:  Goal Directed, Linear and Descriptions of Associations: Intact  Orientation:  Full (Time, Place, and Person)  Thought Content:  Logical and Hallucinations: Auditory Visual  Suicidal Thoughts:  Yes.  with  intent/plan  Homicidal Thoughts:  No  Memory:  Immediate;   Good Recent;   Good Remote;   Good  Judgement:  Fair  Insight:  Fair  Psychomotor Activity:  Normal  Concentration:  Concentration: Good and Attention Span: Good  Recall:  Good  Fund of Knowledge:  Good  Language:  Good  Akathisia:  No  Handed:  Right  AIMS (if indicated):   N/A  Assets:  Communication Skills  ADL's:  Impaired secondary to self-neglect.   Cognition:  WNL  Sleep:   Poor   Assessment:  Judy Graham is a 27 y.o. female who was admitted with suicide attempt by overdose with Amitriptyline and Buspar. She reports depressive symptoms and anxiety in the setting of multiple stressors. She continues to endorse SI and reports that life is not worth living. She additionally reports symptoms concerning for mania (increased energy, pressured speech and decreased need for sleep) in the past. She endorses AVH. She may benefit from starting Zyprexa for mood stabilization (due to concern for bipolar disorder and positive family history), anxiety and stimulating appetite.   Treatment Plan Summary: -Recommend starting Zyprexa 2.5 mg qhs for mood stabilization/psychosis, appetite, anxiety and insomnia. Can increase to 5 mg qhs for symptom management.  -EKG reviewed on 3/11. QTc 628 and reportedly normalized at this time  and verified with nurse since recent EKG not available in Epic.  -Continue to closely monitor QTc for prolongation as well as magnesium and potassium levels to reduce risk for arrhythmias with QTc prolonging agents.   --Patient warrants inpatient psychiatric hospitalization given high risk of harm to self. -Continue bedside sitter.  -Please pursue involuntary commitment if patient refuses voluntary psychiatric hospitalization or attempts to leave the hospital.  -Will sign off on patient at this time. Please consult psychiatry again as needed.    Disposition: Recommend psychiatric Inpatient admission when  medically cleared.  Faythe Dingwall, DO 06/28/2017 11:59 AM

## 2017-06-28 NOTE — Progress Notes (Addendum)
8:52am- CSW spoke with Judy [facility] [facility] [place] on [place].   [facility] and was informed that a CPS report has already been made on child and family and that [facility] is already involved. CSW added information on other concerns regarding child and individuals who are still living in the home. CSW [facility] to make report of self neglect on pt and was unable to file report. CSW left VM asking that APS call CSW back.   CSW spoke with Judy Graham and confirmed that CPS is involved and that he is planning to file paperwork for a Protection Order, [employer], and a Emergency Custody Order. CSW spoke with pt and  was informed that per pt pt will be homeless when discharged from the hospital and expressed that pt would want to walk out in traffic. CSW has reached out to Dr. Sharma CovertNorman (Pschatrist) to follow up with her seeing pt today. CSW expressed to pt that CSW could provided resources for housing. Pt expressed being agreeable to this resources at this time.     Judy Graham, MSW, LCSW-A Emergency Department Clinical Social Worker [phone]

## 2017-06-28 NOTE — Progress Notes (Signed)
PULMONARY / CRITICAL CARE MEDICINE   Name: Judy Graham MRN: [id number] DOB: May 31, 1990    ADMISSION DATE:  06/25/2017  REFERRING MD:  [facility] ED  CHIEF COMPLAINT: Obtundation, drug overdose  HISTORY OF PRESENT ILLNESS:   Took  amitriptyline and buspar today UDS 3/10 + for Benzos. Opiates, Tetrahydrocannabinol  27 y/o female with scoliosis, depression, suicidally overdosed on amitryptiline and buspar, then was found unresponsive, in  [facility] ED, she was intubated for airway protection, got charcoal, Na Bicarb 3 amp, iv fluids; Poison control was in contact with ED and advised Rx. On propofol for sedation on vent.. Lice and fleas noted on scalp hair. Recently seen by Psych.   SUBJECTIVE:  Extubated x 48 hours. She is requesting pain medication. Restraints are off at present We will transfer to telemetry floor on 06/28/2017   VITAL SIGNS: BP 118/81 (BP Location: Right Arm)   Pulse (!) 102   Temp 98.8 F (37.1 C)   Resp (!) 22   Ht 5\' 7"  (1.702 m)   Wt 43.9 kg (96 lb 12.5 oz)   LMP 06/10/2017   SpO2 99%   BMI 15.16 kg/m   HEMODYNAMICS:    VENTILATOR SETTINGS:    INTAKE / OUTPUT: I/O last 3 completed shifts: In: 1933.8 [P.O.:120; I.V.:1813.8] Out: 2210 [Urine:2210]  PHYSICAL EXAMINATION: General: Cachectic white female no acute distress HEENT: No JVD lymphadenopathy PSY: Dull effect Neuro: Moves all extremities request pain medication CV: s1s2 rrr, no m/r/g PULM: even/non-labored, lungs bilaterally decreased in the bases QI:ONGE, non-tender, bsx4 active  Extremities: warm/dry, warm edema  Skin: no rashes or lesions   LABS:  BMET Recent Labs  Lab 06/26/17 0559 06/27/17 0322 06/28/17 0322  NA 142 141 139  K 3.5 3.1* 3.2*  CL 110 110 106  CO2 23 25 25   BUN 8 6 7   CREATININE 0.44 0.43* 0.50  GLUCOSE 108* 88 82    Electrolytes Recent Labs  Lab 06/25/17 2135 06/26/17 0559 06/27/17 0322 06/28/17 0322  CALCIUM 8.0* 8.5* 8.2* 8.3*  MG 1.7 1.8 2.0   --   PHOS 2.7  --   --   --     CBC Recent Labs  Lab 06/26/17 0559 06/27/17 0322 06/28/17 0322  WBC 11.6* 7.2 7.1  HGB 9.4* 8.5* 9.2*  HCT 30.9* 27.8* 29.0*  PLT 210 185 225    Coag's Recent Labs  Lab 06/25/17 1306  INR 1.11    Sepsis Markers No results for input(s): LATICACIDVEN, PROCALCITON, O2SATVEN in the last 168 hours.  ABG Recent Labs  Lab 06/25/17 1448 06/25/17 2031 06/26/17 0413  PHART 7.484* 7.374 7.396  PCO2ART 38.4 42.2 40.4  PO2ART 112* 187.0* 86.0    Liver Enzymes Recent Labs  Lab 06/25/17 1306 06/25/17 2135 06/26/17 0559  AST 34 25 27  ALT 19 16 14   ALKPHOS 64 59 59  BILITOT 0.7 0.5 0.8  ALBUMIN 4.0 3.4* 3.2*    Cardiac Enzymes No results for input(s): TROPONINI, PROBNP in the last 168 hours.  Glucose Recent Labs  Lab 06/25/17 1643  GLUCAP 98    Imaging Dg Chest Port 1 View  Result Date: 06/28/2017 CLINICAL DATA:  Respiratory failure EXAM: PORTABLE CHEST 1 VIEW COMPARISON:  June 27, 2017 FINDINGS: Opacity in the medial left base is stable and likely due to focal consolidation/pneumonia. There is atelectatic change in the right base with small right pleural effusion. Lungs elsewhere are clear. Heart is upper normal in size with pulmonary vascularity within normal limits. No adenopathy.  Postoperative change noted in the thoracic and upper lumbar spine regions. IMPRESSION: Persistent opacity medial left base concerning for focal pneumonia. Right base atelectasis. No new opacity. Stable cardiac silhouette. Electronically Signed   By: Bretta BangWilliam  Woodruff III M.D.   On: 06/28/2017 07:44     STUDIES:   CULTURES: MRSA negative  ANTIBIOTICS: Permethrin shampoo 3/10  SIGNIFICANT EVENTS:  LINES/TUBES: ET tube 3/10 >>   DISCUSSION:  ASSESSMENT / PLAN:  NEUROLOGIC A:   Intentional ingestion, toxic metabolic encephalopathy Depression with active suicidality P:   RASS goal: 0 to -1 Awake and alert, MAE x 4 Plan-  Minimize  sedation Neuro checks per floor Psych consult now extubated Safety sitters now extubated Transfer to telemetry floor  PULMONARY A: Acute respiratory failure due to altered mental status Left lower lobe atelectasis versus infiltrate Extubated 3/11>> tolerating [place] Persistent medial left basilar opacity most consistent with pneumonia T max 99.7, WBC 7.2 P:   Aggressive pulmonary Toilet,  Mobilize CXR >> 3/13 noted Transfer to telemetry floor   CARDIOVASCULAR A:  Amitriptyline ingestion, at risk arrhythmia 06/28/2017 resolved P:  Transfer to telemetry for complete RENAL A:   Hypokalemia>> Replete 3/12 P:   Follow BMP, urine output  GASTROINTESTINAL A:   SU prophylaxis Nutrition P:   . Light.  HEMATOLOGIC Recent Labs    06/27/17 0322 06/28/17 0322  HGB 8.5* 9.2*    A:  Anemia  DVT prophylaxis Leukocytosis, suspect stress reaction versus infectious process>> normalizing P:  Follow CBC Monitor for any obvious source of bleeding Transfuse for HGB < 7  INFECTIOUS A:   Found to have lice At risk left lower lobe pneumonia P:   Permethrin Follow CXR Trend Fever/ WBC curve No abx for now we will continue to monitor for any overt evidence of infection.  ENDOCRINE CBG (last 3)  Recent Labs    06/25/17 1643  GLUCAP 98    A:   At risk hyperglycemia P:   Follow CBG  FAMILY  - Updates: No family present at bedside 3/12. Pt. Updated.  - Inter-disciplinary family meet or Palliative Care meeting due by: 07/02/17   Brett CanalesSteve Maxi Carreras ACNP [facility] Pager [phone] till 1 pm If no answer page [phone] 06/28/2017, 12:44 PM

## 2017-06-28 NOTE — Evaluation (Signed)
Physical Therapy Evaluation Patient Details Name: Judy Graham MRN: [id number] DOB: September 26, 1990 Today's Date: 06/28/2017   History of Present Illness  27 y/o female with scoliosis, depression, suicidally overdosed on amitryptiline and buspar, then was found unresponsive, in [facility] ED, she was intubated for airway protection, got charcoal, Na Bicarb 3 amp, iv fluids; Poison control was in contact with ED and advised Rx.  Polysubstance abuse, benzos, opiates and THC.   Clinical Impression  Pt admitted with above diagnosis. Pt currently with functional limitations due to the deficits listed below (see PT Problem List). Pt able to pivot to recliner with max encouragement and mod assist. Pt wants foley catheter out.  Notified nurse.  Pt repeated many times during session, "I just want to die."  Will follow as pt allows. Pt will benefit from skilled PT to increase their independence and safety with mobility to allow discharge to the venue listed below.      Follow Up Recommendations SNF;Supervision/Assistance - 24 hour    Equipment Recommendations  Other (comment)(TBA)    Recommendations for Other Services       Precautions / Restrictions Precautions Precautions: Fall Precaution Comments: Contact precautions - lice and fleas on pts scalp Restrictions Weight Bearing Restrictions: No      Mobility  Bed Mobility Overal bed mobility: Needs Assistance Bed Mobility: Supine to Sit     Supine to sit: Mod assist     General bed mobility comments: Needed mod assist to come to EOB with pt pulling up on PT hands  Transfers Overall transfer level: Needs assistance Equipment used: 2 person hand held assist Transfers: Sit to/from [employer] CorporationStand;Squat Pivot Transfers Sit to Stand: Mod assist   Squat pivot transfers: Mod assist     General transfer comment: Pt needed mod assist to power up and did not stand fully upright.  Needed mod assist to squat pivot to recliner.   Ambulation/Gait              General Gait Details: unable  Stairs            Wheelchair Mobility    Modified Rankin (Stroke Patients Only)       Balance Overall balance assessment: Needs assistance Sitting-balance support: No upper extremity supported;Feet supported Sitting balance-Leahy Scale: Fair       Standing balance-Leahy Scale: Poor Standing balance comment: requires bil UE support for standing.                              Pertinent Vitals/Pain Pain Assessment: Faces Faces Pain Scale: Hurts whole lot Pain Location: back Pain Descriptors / Indicators: Aching;Crying;Guarding;Grimacing;Stabbing Pain Intervention(s): Limited activity within patient's tolerance;Monitored during session;Repositioned;Patient requesting pain meds-RN notified  VSS  Home Living Family/patient expects to be discharged to:: Private residence Living Arrangements: Non-relatives/Friends Available Help at Discharge: Friend(s);Available PRN/intermittently Type of Home: Homeless(per chart pt will be homeless, home has lice/fleas)         Home Equipment: None      Prior Function Level of Independence: Independent               Hand Dominance        Extremity/Trunk Assessment   Upper Extremity Assessment Upper Extremity Assessment: Defer to OT evaluation    Lower Extremity Assessment Lower Extremity Assessment: RLE deficits/detail;LLE deficits/detail RLE Deficits / Details: grossly 3-/5 LLE Deficits / Details: grossly 3-/5    Cervical / Trunk Assessment Cervical / Trunk Assessment: Kyphotic  Communication  Communication: No difficulties  Cognition Arousal/Alertness: Awake/alert Behavior During Therapy: Anxious Overall Cognitive Status: Within Functional Limits for tasks assessed                                        General Comments      Exercises General Exercises - Lower Extremity Ankle Circles/Pumps: AROM;Both;5 reps;Supine Long Arc Quad:  AROM;Both;5 reps;Seated   Assessment/Plan    PT Assessment Patient needs continued PT services  PT Problem List Decreased strength;Decreased activity tolerance;Decreased balance;Decreased mobility;Decreased knowledge of use of DME;Decreased safety awareness;Decreased knowledge of precautions;Cardiopulmonary status limiting activity;Pain       PT Treatment Interventions DME instruction;Gait training;Functional mobility training;Therapeutic activities;Therapeutic exercise;Balance training;Patient/family education    PT Goals (Current goals can be found in the Care Plan section)  Acute Rehab PT Goals Patient Stated Goal: to die per pt PT Goal Formulation: With patient Time For Goal Achievement: 07/12/17 Potential to Achieve Goals: Good    Frequency Min 3X/week   Barriers to discharge        Co-evaluation               AM-PAC PT "6 Clicks" Daily Activity  Outcome Measure Difficulty turning over in bed (including adjusting bedclothes, sheets and blankets)?: Unable Difficulty moving from lying on back to sitting on the side of the bed? : Unable Difficulty sitting down on and standing up from a chair with arms (e.g., wheelchair, bedside commode, etc,.)?: A Lot Help needed moving to and from a bed to chair (including a wheelchair)?: A Lot Help needed walking in hospital room?: Total Help needed climbing 3-5 steps with a railing? : Total 6 Click Score: 8    End of Session Equipment Utilized During Treatment: Gait belt Activity Tolerance: Patient limited by fatigue Patient left: in chair;with call bell/phone within reach;with chair alarm set;with nursing/sitter in room Nurse Communication: Mobility status;Patient requests pain meds PT Visit Diagnosis: Unsteadiness on feet (R26.81);Muscle weakness (generalized) (M62.81)    Time: 0945-1000 PT Time Calculation (min) (ACUTE ONLY): 15 min   Charges:   PT Evaluation $PT Eval Moderate Complexity: 1 Mod     PT G Codes:         Tryson Lumley,PT Acute Rehabilitation [phone] (pager)   Berline Lopes 06/28/2017, 12:50 PM
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
## 2017-06-28 NOTE — Progress Notes (Signed)
80mL of propofol wasted in sink, witnessed by Edison PaceAshley Jarrell, RN

## 2017-06-29 ENCOUNTER — Other Ambulatory Visit: Payer: Self-pay

## 2017-06-29 DIAGNOSIS — T50902A Poisoning by unspecified drugs, medicaments and biological substances, intentional self-harm, initial encounter: Secondary | ICD-10-CM

## 2017-06-29 LAB — BASIC METABOLIC PANEL
Anion gap: 9 (ref 5–15)
CALCIUM: 8.8 mg/dL — AB (ref 8.9–10.3)
CO2: 24 mmol/L (ref 22–32)
Chloride: 106 mmol/L (ref 101–111)
Creatinine, Ser: 0.42 mg/dL — ABNORMAL LOW (ref 0.44–1.00)
GFR calc Af Amer: >60 mL/min (ref >60)
Glucose, Bld: 89 mg/dL (ref 65–99)
Potassium: 4 mmol/L (ref 3.5–5.1)
SODIUM: 139 mmol/L (ref 135–145)

## 2017-06-29 MED ORDER — PERMETHRIN 1 % EX LOTN
TOPICAL_LOTION | Freq: Once | CUTANEOUS | Status: AC
  Administered 2017-06-29: 15:00:00 via TOPICAL
  Filled 2017-06-29: qty 59

## 2017-06-29 MED ORDER — NITROGLYCERIN 0.4 MG SL SUBL
SUBLINGUAL_TABLET | SUBLINGUAL | Status: AC
  Administered 2017-06-29: 0.4 mg
  Filled 2017-06-29: qty 1

## 2017-06-29 MED ORDER — KETOROLAC TROMETHAMINE 15 MG/ML IJ SOLN
15.0000 mg | Freq: Four times a day (QID) | INTRAMUSCULAR | Status: DC | PRN
Start: 2017-06-29 — End: 2017-07-01
  Administered 2017-06-29 – 2017-07-01 (×6): 15 mg via INTRAVENOUS
  Filled 2017-06-29 (×6): qty 1

## 2017-06-29 MED ORDER — OLANZAPINE 2.5 MG PO TABS
2.5000 mg | ORAL_TABLET | Freq: Every day | ORAL | Status: DC
  Administered 2017-06-29: 2.5 mg via ORAL
  Filled 2017-06-29: qty 1

## 2017-06-29 MED ORDER — NITROGLYCERIN 0.4 MG SL SUBL
0.4000 mg | SUBLINGUAL_TABLET | SUBLINGUAL | Status: DC | PRN
  Administered 2017-06-29 (×2): 0.4 mg via SUBLINGUAL

## 2017-06-29 MED ORDER — AMOXICILLIN-POT CLAVULANATE 875-125 MG PO TABS
1.0000 | ORAL_TABLET | Freq: Two times a day (BID) | ORAL | Status: DC
  Administered 2017-06-29 – 2017-07-02 (×7): 1 via ORAL
  Filled 2017-06-29 (×7): qty 1

## 2017-06-29 MED ORDER — BUTALBITAL-APAP-CAFFEINE 50-325-40 MG PO TABS
1.0000 | ORAL_TABLET | Freq: Four times a day (QID) | ORAL | Status: DC | PRN
  Administered 2017-06-29 – 2017-07-02 (×10): 1 via ORAL
  Filled 2017-06-29 (×10): qty 1

## 2017-06-29 NOTE — Plan of Care (Signed)
  Clinical Measurements: Ability to maintain clinical measurements within normal limits will improve 06/29/2017 2352 - Progressing by Ragan Reale, Marlana Salvageonnie A, RN   Clinical Measurements: Will remain free from infection 06/29/2017 2352 - Progressing by Jubilee Vivero, Marlana Salvageonnie A, RN   Health Behavior/Discharge Planning: Ability to manage health-related needs will improve 06/29/2017 2352 - Progressing by Paraskevi Funez, Marlana Salvageonnie A, RN
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
## 2017-06-29 NOTE — Progress Notes (Signed)
Pt requests pain medication. RN informed pt she is not due for pain medication at this time. Pt then told RN that she has chest pain. RN notified MD. MD states to give pt nitro and do an EKG. MD states she will add another pain med prn to pt's MAR.

## 2017-06-29 NOTE — Progress Notes (Signed)
AD states pt can speak to her boyfriend on the phone, but to unplug the phone and remove it from the room when the pt is not using the phone.

## 2017-06-29 NOTE — Plan of Care (Signed)
  Clinical Measurements: Respiratory complications will improve 06/29/2017 0152 - Progressing by Thoams Siefert A, RN   Nutrition: Adequate nutrition will be maintained 06/29/2017 0152 - Progressing by Karmelo Bass A, RN   Coping: Level of anxiety will decrease 06/29/2017 0152 - Progressing by Mattheo Swindle, Marlana Salvageonnie A, RN

## 2017-06-29 NOTE — Progress Notes (Signed)
Pt c/o 9/10 CP after receiving 3 doses of SL ntg. VSS and EKG obtained.  MD notified.

## 2017-06-29 NOTE — Progress Notes (Signed)
Lice eggs observed in pt's hair. RN notified MD for orders.

## 2017-06-30 LAB — BLOOD GAS, ARTERIAL
Bicarbonate: 29 mmol/L — ABNORMAL HIGH (ref 20.0–28.0)
Drawn by: 257881
FIO2: 40
MECHVT: 350 mL
O2 Saturation: 98.2 %
PATIENT TEMPERATURE: 35.3
PEEP: 5 cmH2O
PH ART: 7.484 — AB (ref 7.350–7.450)
RATE: 16 resp/min
pCO2 arterial: 38.4 mmHg (ref 32.0–48.0)
pO2, Arterial: 112 mmHg — ABNORMAL HIGH (ref 83.0–108.0)

## 2017-06-30 LAB — BASIC METABOLIC PANEL
Anion gap: 10 (ref 5–15)
BUN: 5 mg/dL — AB (ref 6–20)
CHLORIDE: 105 mmol/L (ref 101–111)
CO2: 22 mmol/L (ref 22–32)
CREATININE: 0.47 mg/dL (ref 0.44–1.00)
Calcium: 9.1 mg/dL (ref 8.9–10.3)
GFR calc Af Amer: >60 mL/min (ref >60)
GFR calc non Af Amer: >60 mL/min (ref >60)
Glucose, Bld: 85 mg/dL (ref 65–99)
POTASSIUM: 4.2 mmol/L (ref 3.5–5.1)
Sodium: 137 mmol/L (ref 135–145)

## 2017-06-30 LAB — MAGNESIUM: Magnesium: 2 mg/dL (ref 1.7–2.4)

## 2017-06-30 MED ORDER — OLANZAPINE 5 MG PO TABS
5.0000 mg | ORAL_TABLET | Freq: Every day | ORAL | Status: DC
  Administered 2017-06-30 – 2017-07-01 (×2): 5 mg via ORAL
  Filled 2017-06-30 (×2): qty 1

## 2017-06-30 MED ORDER — ZOLPIDEM TARTRATE 5 MG PO TABS
5.0000 mg | ORAL_TABLET | Freq: Every evening | ORAL | Status: DC | PRN
  Administered 2017-07-01 (×2): 5 mg via ORAL
  Filled 2017-06-30 (×2): qty 1

## 2017-06-30 MED ORDER — HYDROXYZINE HCL 10 MG PO TABS
10.0000 mg | ORAL_TABLET | Freq: Four times a day (QID) | ORAL | Status: DC | PRN
  Administered 2017-06-30 – 2017-07-01 (×2): 10 mg via ORAL
  Filled 2017-06-30 (×4): qty 1

## 2017-06-30 MED ORDER — PROMETHAZINE HCL 25 MG/ML IJ SOLN
6.2500 mg | Freq: Four times a day (QID) | INTRAMUSCULAR | Status: DC | PRN
  Administered 2017-06-30 – 2017-07-02 (×4): 6.25 mg via INTRAVENOUS
  Filled 2017-06-30 (×5): qty 1

## 2017-06-30 MED ORDER — DIAZEPAM 5 MG PO TABS
5.0000 mg | ORAL_TABLET | Freq: Four times a day (QID) | ORAL | Status: DC | PRN
  Administered 2017-06-30 – 2017-07-02 (×6): 5 mg via ORAL
  Filled 2017-06-30 (×6): qty 1

## 2017-06-30 NOTE — Progress Notes (Signed)
Physical Therapy Discharge Patient Details Name: Judy Graham MRN: [id number] DOB: Feb 04, 1991 Today's Date: 06/30/2017 Time: 2979-8921 PT Time Calculation (min) (ACUTE ONLY): 11 min  Patient discharged from PT services secondary to goals met and no further PT needs identified.  Please see latest therapy progress note for current level of functioning and progress toward goals.    Progress and discharge plan discussed with patient and/or caregiver: Patient agrees with plan.  GP    Informed supervising PT of need for d/c of services.  PT will address goals and d/c order.  [place], PTA pager [phone]  06/30/2017, 11:53 AM

## 2017-06-30 NOTE — Progress Notes (Signed)
Physical Therapy Treatment Patient Details Name: Judy Graham MRN: [id number] DOB: 1990-11-11 Today's Date: 06/30/2017    History of Present Illness 27 y/o female with scoliosis, depression, suicidally overdosed on amitryptiline and buspar, then was found unresponsive, in [facility] ED, she was intubated for airway protection, got charcoal, Na Bicarb 3 amp, iv fluids; Poison control was in contact with ED and advised Rx.  Polysubstance abuse, benzos, opiates and THC.     PT Comments    Pt performed mobility to demonstrate independence with her movements.  Pt no longer demonstrates a need for PT intervention at this time.  All goals met and patient is safe to return home from a mobility standpoint.  She will likely require Behavioral Health at d/c.      Follow Up Recommendations  No PT follow up(Pt would benefit from follow up for behavioral health but no PT needs are present.  )     Equipment Recommendations  Other (comment)(TBA)    Recommendations for Other Services       Precautions / Restrictions Precautions Precautions: Fall Precaution Comments: Contact precautions - lice and fleas on pts scalp Restrictions Weight Bearing Restrictions: No    Mobility  Bed Mobility Overal bed mobility: Modified Independent Bed Mobility: Supine to Sit;Sit to Supine     Supine to sit: Independent     General bed mobility comments: Pt able to sit up unassisted without use of railing.  Pt performed back to bed entering with her knee first and then transitioning to her bottom independently.    Transfers Overall transfer level: Independent Equipment used: None Transfers: Sit to/from Stand     Squat pivot transfers: Independent     General transfer comment: No assistance needed.    Ambulation/Gait Ambulation/Gait assistance: Independent Ambulation Distance (Feet): 20 Feet(Pt refused to perform multiple times because she could not leave her room due to isloation precautions.   ) Assistive device: None Gait Pattern/deviations: Step-through pattern;WFL(Within Functional Limits)   Gait velocity interpretation: at or above normal speed for age/gender General Gait Details: No assistance needed and gait appropriate speed.     Stairs            Wheelchair Mobility    Modified Rankin (Stroke Patients Only)       Balance                                            Cognition Arousal/Alertness: Awake/alert Behavior During Therapy: Anxious Overall Cognitive Status: Within Functional Limits for tasks assessed                                        Exercises      General Comments        Pertinent Vitals/Pain Pain Assessment: 0-10 Pain Score: 8  Pain Location: back Pain Descriptors / Indicators: Aching;Crying;Guarding;Grimacing;Stabbing Pain Intervention(s): Limited activity within patient's tolerance;Monitored during session;Repositioned;Patient requesting pain meds-RN notified    Home Living                      Prior Function            PT Goals (current goals can now be found in the care plan section) Acute Rehab PT Goals Patient Stated Goal: To get pain meds.   Potential  to Achieve Goals: Good Progress towards PT goals: Goals met/education completed, patient discharged from PT    Frequency    Min 3X/week      PT Plan Discharge plan needs to be updated    Co-evaluation              AM-PAC PT "6 Clicks" Daily Activity  Outcome Measure  Difficulty turning over in bed (including adjusting bedclothes, sheets and blankets)?: None Difficulty moving from lying on back to sitting on the side of the bed? : None Difficulty sitting down on and standing up from a chair with arms (e.g., wheelchair, bedside commode, etc,.)?: None Help needed moving to and from a bed to chair (including a wheelchair)?: None Help needed walking in hospital room?: None Help needed climbing 3-5 steps with a  railing? : None 6 Click Score: 24    End of Session Equipment Utilized During Treatment: Gait belt Activity Tolerance: Patient tolerated treatment well Patient left: with call bell/phone within reach;with nursing/sitter in room;in bed(sitter in room for suicide precautions) Nurse Communication: Mobility status;Patient requests pain meds PT Visit Diagnosis: Unsteadiness on feet (R26.81);Muscle weakness (generalized) (M62.81)     Time: 4967-5916 PT Time Calculation (min) (ACUTE ONLY): 11 min  Charges:  $Gait Training: 8-22 mins                    G Codes:       Governor Rooks, PTA pager [phone]    Cristela Blue 06/30/2017, 11:50 AM

## 2017-06-30 NOTE — Progress Notes (Signed)
Clinical Social Worker was consulted by MD to assist in IVC patient. Per MD, MD attempted to have a conversation via phone with patient to explain to her the importance of staying until she has been placed into a inpatient psychiatric facility. CSW filled out paper work and faxed over information to [facility] Incmagistrate office. At this time CSW is awaiting magistrates decision to serve patient with IVC paperwork.   Marrianne MoodAshley Paytience Bures, MSW,  [employer]CSWA [phone]

## 2017-06-30 NOTE — Progress Notes (Signed)
DR. Alvino Chapelhoi paged via patient's request for another anti-anxiety medication.  Valium 5mg  ordered and given.  Patient calm at present.  She removed telemetry again when served IVC paperwork and allowed us to put telemetry back on at present.

## 2017-06-30 NOTE — Progress Notes (Signed)
Called by RN, patient wants to leave and go home with husband. I spoke with patient over the phone. She states that her family can care for her, and her significant other is going to visit her tonight and take her home. [facility] and will look into it. I discussed with her psych recommendations for inpatient psych hospitalization and that she cannot leave the hospital to go home for safety reasons. She asked me if psych could re-evaluate. I spoke with Dr. Sharma CovertNorman today; patient was evaluated just 2 days ago for amitriptyline and buspar overdose in suicide attempt. Her recommendation is not changed and she continues to recommend inpatient psych placement and involuntary commitment if required. SW aware of situation.   Patient may NOT leave AMA.   Noralee StainJennifer Bethanne Mule, DO [facility] [email or web address] Password [email or web address] 06/30/2017, 1:46 PM

## 2017-06-30 NOTE — Progress Notes (Signed)
Patient served IVC paperwork by [employer], primary RN, and ChiropodistAssistant Director of Nursing. Patient verbalizes understanding of IVC paperwork. She has also been made aware that her boyfriend was here visiting and that per her doctor's orders he is not to visit during her stay. She was very upset about him not being able to visit. [employer] made the boyfriend aware that he was not visit and he was fine with that. IVC paperwork has been placed on the patient's chart.

## 2017-06-30 NOTE — Progress Notes (Signed)
Judy Graham's telemetry removed by self and she refused to let us put back on.  She requested a different pain medicine, Phenergan for nausea, and anti-anxiety medicine. Dr. Alvino Chapelhoi informed of all above.  CCMD notified that patient is alert and oriented and refusing to let us put heart monitor on.
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
## 2017-06-30 NOTE — Progress Notes (Signed)
Patient want frequent medications for pain and anxiety. Asked for all prn close together explained the risk of Respiratory depression. Want anyway Poor judgement reported to night shift nurse. sitter in place for safety.

## 2017-07-01 ENCOUNTER — Inpatient Hospital Stay (HOSPITAL_COMMUNITY): Payer: Medicaid Other

## 2017-07-01 DIAGNOSIS — T50902A Poisoning by unspecified drugs, medicaments and biological substances, intentional self-harm, initial encounter: Secondary | ICD-10-CM

## 2017-07-01 LAB — BASIC METABOLIC PANEL
ANION GAP: 8 (ref 5–15)
BUN: 8 mg/dL (ref 6–20)
CALCIUM: 9.1 mg/dL (ref 8.9–10.3)
CO2: 26 mmol/L (ref 22–32)
Chloride: 103 mmol/L (ref 101–111)
Creatinine, Ser: 0.47 mg/dL (ref 0.44–1.00)
GFR calc Af Amer: >60 mL/min (ref >60)
GLUCOSE: 87 mg/dL (ref 65–99)
POTASSIUM: 4 mmol/L (ref 3.5–5.1)
Sodium: 137 mmol/L (ref 135–145)

## 2017-07-01 LAB — MAGNESIUM: Magnesium: 2.3 mg/dL (ref 1.7–2.4)

## 2017-07-01 MED ORDER — KETOROLAC TROMETHAMINE 15 MG/ML IJ SOLN
15.0000 mg | Freq: Four times a day (QID) | INTRAMUSCULAR | Status: DC | PRN
  Administered 2017-07-01 – 2017-07-02 (×4): 30 mg via INTRAVENOUS
  Filled 2017-07-01 (×4): qty 2

## 2017-07-01 MED ORDER — METOCLOPRAMIDE HCL 5 MG/ML IJ SOLN
10.0000 mg | Freq: Once | INTRAMUSCULAR | Status: AC
  Administered 2017-07-01: 10 mg via INTRAVENOUS
  Filled 2017-07-01: qty 2

## 2017-07-01 NOTE — Progress Notes (Signed)
Pt is alert and oriented up walking in hall PRNs given for pain and mood pt is watch the clock for when the next PRN valium is due.

## 2017-07-01 NOTE — Progress Notes (Signed)
[place], she stated if treatment has been completed for 24 hours, pt has no s/sx and inspection of scalp is clear then pt can be taken off precautions  Primary RN aware and completing scalp inspection, reported none seen

## 2017-07-01 NOTE — Clinical Social Work Note (Signed)
Clinical Social Worker continuing to follow for support and discharge planning needs.  Per MD, patient medically stable for discharge tomorrow.  CSW contacted Inetta Fermoina at [facility] to notify of referral for tomorrow.  Patient is currently IVC'd and will remain for [facility].  [facility] to contact CSW tomorrow to follow up on bed availability for possible admission.  CSW remains available for support and to facilitate patient discharge needs.  Macario GoldsJesse Brentney [place]bach, [place] [phone]

## 2017-07-01 NOTE — Plan of Care (Signed)
  Health Behavior/Discharge Planning: Ability to manage health-related needs will improve 07/01/2017 0102 - Progressing by Elnita Maxwellodoo, Lorris Carducci A, RN   Clinical Measurements: Ability to maintain clinical measurements within normal limits will improve 07/01/2017 0102 - Progressing by Elnita Maxwellodoo, Kyren Vaux A, RN   Coping: Level of anxiety will decrease 07/01/2017 0102 - Not Progressing by Elnita Maxwellodoo, Aeisha Minarik A, RN   Pain Managment: General experience of comfort will improve 07/01/2017 0102 - Progressing by Elnita Maxwellodoo, Judeen Geralds A, RN   Spiritual Needs Ability to function at adequate level 07/01/2017 0102 - Progressing by Elnita Maxwellodoo, Chieko Neises A, RN

## 2017-07-01 NOTE — Progress Notes (Signed)
Taken to ultrasound via wheelchair transport for pelvic ultrasound.  Suicide Landscape architectsitter escorting.
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
## 2017-07-01 NOTE — Progress Notes (Signed)
Patient is experiencing nausea Prn not due MD notified and ordered reglan

## 2017-07-01 NOTE — Progress Notes (Signed)
Pt refusing vistaril 10 mg

## 2017-07-02 ENCOUNTER — Other Ambulatory Visit: Payer: Self-pay

## 2017-07-02 ENCOUNTER — Encounter (HOSPITAL_COMMUNITY): Payer: Self-pay | Admitting: *Deleted

## 2017-07-02 ENCOUNTER — Inpatient Hospital Stay (HOSPITAL_COMMUNITY)
Admission: AD | Admit: 2017-07-02 | Discharge: 2017-07-05 | DRG: 885 | Disposition: A | Discharge status: Home / Self Care | Payer: Medicaid Other | Attending: Psychiatry | Admitting: Psychiatry

## 2017-07-02 DIAGNOSIS — G4700 Insomnia, unspecified: Secondary | ICD-10-CM | POA: Diagnosis present

## 2017-07-02 DIAGNOSIS — R4587 Impulsiveness: Secondary | ICD-10-CM | POA: Diagnosis present

## 2017-07-02 DIAGNOSIS — G43909 Migraine, unspecified, not intractable, without status migrainosus: Secondary | ICD-10-CM | POA: Diagnosis not present

## 2017-07-02 DIAGNOSIS — Z91018 Allergy to other foods: Secondary | ICD-10-CM

## 2017-07-02 DIAGNOSIS — K219 Gastro-esophageal reflux disease without esophagitis: Secondary | ICD-10-CM | POA: Diagnosis present

## 2017-07-02 DIAGNOSIS — F4323 Adjustment disorder with mixed anxiety and depressed mood: Secondary | ICD-10-CM | POA: Diagnosis present

## 2017-07-02 DIAGNOSIS — Z881 Allergy status to other antibiotic agents status: Secondary | ICD-10-CM | POA: Diagnosis not present

## 2017-07-02 DIAGNOSIS — F419 Anxiety disorder, unspecified: Secondary | ICD-10-CM | POA: Diagnosis present

## 2017-07-02 DIAGNOSIS — Z888 Allergy status to other drugs, medicaments and biological substances status: Secondary | ICD-10-CM | POA: Diagnosis not present

## 2017-07-02 DIAGNOSIS — F17210 Nicotine dependence, cigarettes, uncomplicated: Secondary | ICD-10-CM | POA: Diagnosis present

## 2017-07-02 DIAGNOSIS — M549 Dorsalgia, unspecified: Secondary | ICD-10-CM | POA: Diagnosis not present

## 2017-07-02 DIAGNOSIS — B182 Chronic viral hepatitis C: Secondary | ICD-10-CM | POA: Diagnosis present

## 2017-07-02 DIAGNOSIS — Z981 Arthrodesis status: Secondary | ICD-10-CM

## 2017-07-02 DIAGNOSIS — T50902A Poisoning by unspecified drugs, medicaments and biological substances, intentional self-harm, initial encounter: Secondary | ICD-10-CM | POA: Diagnosis not present

## 2017-07-02 DIAGNOSIS — F3131 Bipolar disorder, current episode depressed, mild: Secondary | ICD-10-CM | POA: Diagnosis not present

## 2017-07-02 DIAGNOSIS — F314 Bipolar disorder, current episode depressed, severe, without psychotic features: Secondary | ICD-10-CM | POA: Diagnosis not present

## 2017-07-02 DIAGNOSIS — Z915 Personal history of self-harm: Secondary | ICD-10-CM

## 2017-07-02 DIAGNOSIS — R450 Nervousness: Secondary | ICD-10-CM | POA: Diagnosis not present

## 2017-07-02 DIAGNOSIS — Z630 Problems in relationship with spouse or partner: Secondary | ICD-10-CM | POA: Diagnosis not present

## 2017-07-02 DIAGNOSIS — F319 Bipolar disorder, unspecified: Principal | ICD-10-CM | POA: Diagnosis present

## 2017-07-02 DIAGNOSIS — T1491XA Suicide attempt, initial encounter: Secondary | ICD-10-CM | POA: Diagnosis not present

## 2017-07-02 DIAGNOSIS — Z79899 Other long term (current) drug therapy: Secondary | ICD-10-CM | POA: Diagnosis not present

## 2017-07-02 DIAGNOSIS — T50992A Poisoning by other drugs, medicaments and biological substances, intentional self-harm, initial encounter: Secondary | ICD-10-CM | POA: Diagnosis not present

## 2017-07-02 DIAGNOSIS — M2550 Pain in unspecified joint: Secondary | ICD-10-CM | POA: Diagnosis not present

## 2017-07-02 LAB — BASIC METABOLIC PANEL
Anion gap: 9 (ref 5–15)
BUN: 11 mg/dL (ref 6–20)
CALCIUM: 9 mg/dL (ref 8.9–10.3)
CO2: 24 mmol/L (ref 22–32)
CREATININE: 0.53 mg/dL (ref 0.44–1.00)
Chloride: 105 mmol/L (ref 101–111)
Glucose, Bld: 99 mg/dL (ref 65–99)
Potassium: 4.3 mmol/L (ref 3.5–5.1)
Sodium: 138 mmol/L (ref 135–145)

## 2017-07-02 LAB — MAGNESIUM: Magnesium: 2.1 mg/dL (ref 1.7–2.4)

## 2017-07-02 MED ORDER — DIAZEPAM 2 MG PO TABS
2.0000 mg | ORAL_TABLET | Freq: Once | ORAL | Status: AC
  Administered 2017-07-02: 2 mg via ORAL
  Filled 2017-07-02: qty 1

## 2017-07-02 MED ORDER — ALUM & MAG HYDROXIDE-SIMETH 200-200-20 MG/5ML PO SUSP: 30.0000 mL | ORAL | Status: DC | PRN

## 2017-07-02 MED ORDER — OLANZAPINE 5 MG PO TABS
5.0000 mg | ORAL_TABLET | Freq: Every day | ORAL | Status: DC
  Administered 2017-07-02 – 2017-07-04 (×3): 5 mg via ORAL
  Filled 2017-07-02 (×4): qty 1
  Filled 2017-07-02: qty 2
  Filled 2017-07-02: qty 1

## 2017-07-02 MED ORDER — TRAZODONE HCL 50 MG PO TABS
50.0000 mg | ORAL_TABLET | Freq: Every evening | ORAL | Status: DC | PRN
  Administered 2017-07-02 – 2017-07-04 (×4): 50 mg via ORAL
  Filled 2017-07-02 (×8): qty 1

## 2017-07-02 MED ORDER — OLANZAPINE 5 MG PO TABS: 5.0000 mg | ORAL_TABLET | Freq: Every day | ORAL | 0 refills | Status: DC

## 2017-07-02 MED ORDER — TRAZODONE HCL 50 MG PO TABS
50.0000 mg | ORAL_TABLET | Freq: Every evening | ORAL | Status: DC | PRN
  Administered 2017-07-02: 50 mg via ORAL

## 2017-07-02 MED ORDER — AMOXICILLIN-POT CLAVULANATE 875-125 MG PO TABS: 1.0000 | ORAL_TABLET | Freq: Two times a day (BID) | ORAL | 0 refills | Status: DC

## 2017-07-02 MED ORDER — MAGNESIUM HYDROXIDE 400 MG/5ML PO SUSP: 30.0000 mL | Freq: Every day | ORAL | Status: DC | PRN

## 2017-07-02 MED ORDER — BUTALBITAL-APAP-CAFFEINE 50-325-40 MG PO TABS
1.0000 | ORAL_TABLET | Freq: Four times a day (QID) | ORAL | Status: DC | PRN
  Administered 2017-07-03 – 2017-07-05 (×7): 1 via ORAL
  Filled 2017-07-02 (×7): qty 1

## 2017-07-02 MED ORDER — DIAZEPAM 5 MG PO TABS
5.0000 mg | ORAL_TABLET | Freq: Every day | ORAL | Status: DC | PRN
Start: 2017-07-02 — End: 2017-07-04
  Administered 2017-07-02 – 2017-07-03 (×2): 5 mg via ORAL
  Filled 2017-07-02 (×2): qty 1

## 2017-07-02 MED ORDER — HYDROCODONE-ACETAMINOPHEN 5-325 MG PO TABS
1.0000 | ORAL_TABLET | Freq: Four times a day (QID) | ORAL | Status: DC | PRN
Start: 2017-07-02 — End: 2017-07-05
  Administered 2017-07-02 – 2017-07-05 (×8): 1 via ORAL
  Filled 2017-07-02 (×9): qty 1

## 2017-07-02 MED ORDER — HYDROXYZINE HCL 25 MG PO TABS
25.0000 mg | ORAL_TABLET | Freq: Three times a day (TID) | ORAL | Status: DC | PRN
  Administered 2017-07-02 – 2017-07-03 (×3): 25 mg via ORAL
  Filled 2017-07-02 (×2): qty 1

## 2017-07-02 MED ORDER — ACETAMINOPHEN 325 MG PO TABS
650.0000 mg | ORAL_TABLET | ORAL | Status: DC | PRN
  Administered 2017-07-02: 650 mg via ORAL
  Filled 2017-07-02: qty 4

## 2017-07-02 MED ORDER — FAMOTIDINE 20 MG PO TABS
20.0000 mg | ORAL_TABLET | Freq: Two times a day (BID) | ORAL | Status: DC
  Administered 2017-07-02 – 2017-07-05 (×6): 20 mg via ORAL
  Filled 2017-07-02 (×12): qty 1

## 2017-07-02 MED ORDER — FAMOTIDINE 40 MG/5ML PO SUSR: 20.0000 mg | Freq: Two times a day (BID) | ORAL | Status: DC

## 2017-07-02 MED ORDER — AMITRIPTYLINE HCL 25 MG PO TABS
25.0000 mg | ORAL_TABLET | Freq: Every day | ORAL | Status: DC
  Filled 2017-07-02: qty 2

## 2017-07-02 MED ORDER — AMOXICILLIN-POT CLAVULANATE 875-125 MG PO TABS
1.0000 | ORAL_TABLET | Freq: Two times a day (BID) | ORAL | Status: AC
  Administered 2017-07-02 – 2017-07-03 (×3): 1 via ORAL
  Filled 2017-07-02 (×5): qty 1

## 2017-07-02 NOTE — Progress Notes (Signed)
Patient discharged via sheriff's department. Patient's wallet sealed in envelope and signed by patient and myself, given to officers to take with [facility]. Gildardo CrankerAnita Priddy, RN

## 2017-07-02 NOTE — Progress Notes (Signed)
Nursing Progress Note: 7p-7a D: Pt currently presents with a pleasant/flat/sad affect and behavior. Pt states "I know I messed up. I was just so busy taking care of my two kids, I didn't think about me. Today, I have had a lot of time to reflect on myself and accepting my flaws. I'm not perfect, but all I can do is try." Interacting appropriately with the milieu. Pt reports fair sleep during the previous night with current medication regimen. Pt did attend wrap-up group.  A: Pt provided with medications per providers orders. Pt's labs and vitals were monitored throughout the night. Pt supported emotionally and encouraged to express concerns and questions. Pt educated on medications.  R: Pt's safety ensured with 15 minute and environmental checks. Pt currently denies SI, HI, and AVH. Pt verbally contracts to seek staff if SI,HI, or AVH occurs and to consult with staff before acting on any harmful thoughts. Will continue to monitor.

## 2017-07-02 NOTE — Progress Notes (Signed)
Adult Psychoeducational Group Note  Date:  07/02/2017 Time:  8:52 PM  Group Topic/Focus:  Wrap-Up Group:   The focus of this group is to help patients review their daily goal of treatment and discuss progress on daily workbooks.  Participation Level:  Minimal  Participation Quality:  Appropriate  Affect:  Appropriate  Cognitive:  Oriented  Insight: Appropriate  Engagement in Group:  Engaged  Modes of Intervention:  Socialization and Support  Additional Comments:  Patient attended and participated in group tonight. She reports that she just came in today. He had some time to reflect. She want to to be better at being herself. Today she socialized and went for dinner.  Judy MainsFrancis, Judy Graham Judy Graham 07/02/2017, 8:52 PM
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
## 2017-07-02 NOTE — Progress Notes (Signed)
Admission DAR Note: pt is a 27 y/o caucasian female who [facility] for [facility] of care related to depression (suicide attempt on 06/25/17). Pt presents with flat affect and depressed mood on initial contact. Rates her depression 4/10, anxiety 10/10 and hopelessness 2/10. Denies SI, HI VH when assessed "not right now" verbally contracts for safety. Per pt "I'm here because I tried to kill myself couple days ago, I ate 2 bottles of pill". 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 States she lives with her fiancee, father and sister are supportive. Per [facility] RN, pt sister did call pt prior to transfer to inform pt that her fiancee has been cheating on her with pt's best friend, put pt out of home at one point. Pt has been tearful on unit as well. Skin assessment done and belonging searched as per protocol. Pt appears very thin / skinny with exposed bony prominences (scapula, elbows etc). Multiple tattoos noted all on pt's body. No contraband noted; pt only had a sealed envelope with her wallet in it at time of admission. Per nursing report, pt was treated for head lice in the ED (contact precaution, lice shampoo) and was medically cleared prior to transfer. Pt placed on high falls risk "I fell recently, my legs get weak at times because of my bad spine / back". No fall event to note at this time. Unit orientation done, routines and care plan reviewed with pt, understanding verbalized. Safety checks initiated at Q 15 minutes intervals without self harm gestures or outburst thus far. POC initiated for safety and mood stability.

## 2017-07-02 NOTE — Tx Team (Addendum)
Initial Treatment Plan 07/02/2017 5:45 PM Judy Cruiseicole Osment ZOX:[id number]    PATIENT STRESSORS: Health problems Marital or family conflict   PATIENT STRENGTHS: Ability for insight Average or above average intelligence Capable of independent living Communication skills Motivation for treatment/growth Supportive family/friends   PATIENT IDENTIFIED PROBLEMS: Risk for self harm (OD on meds on 3/10 suicide attempt) "I tried to kill myself couple days ago, I ate 2 bottles of pills".  Alteration in mood (Depression, sadness)  Alteration in comfort (chronic pain "deteorating spine-- scoliosis per pt "I've had multiple surgery, pain is still worse")                 DISCHARGE CRITERIA:  Improved stabilization in mood, thinking, and/or behavior Verbal commitment to aftercare and medication compliance  PRELIMINARY DISCHARGE PLAN: Outpatient therapy Return to previous living arrangement  PATIENT/FAMILY INVOLVEMENT: This treatment plan has been presented to and reviewed with the patient, Judy Graham.  The patient have been given the opportunity to ask questions and make suggestions.  Sherryl MangesWesseh, Kristian Mogg, RN 07/02/2017, 5:45 PM
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
## 2017-07-02 NOTE — Progress Notes (Signed)
[facility] Anticipated discharge date: 07/02/17 Transportation by: [place]  Patient transport set for 3:30 PM.  Patient[facility]; family must come pick up.  Accepting provider is Dr. Sharma CovertNorman. Attending provider is Dr. Emeline DarlingIze.  Nurse to call report to [phone]. Nurse to call report prior to patient pick up.  CSW signing off.  Blenda Nicelylizabeth Auden Wettstein LCSW [phone]

## 2017-07-03 DIAGNOSIS — R450 Nervousness: Secondary | ICD-10-CM

## 2017-07-03 DIAGNOSIS — M2550 Pain in unspecified joint: Secondary | ICD-10-CM

## 2017-07-03 DIAGNOSIS — T50902A Poisoning by unspecified drugs, medicaments and biological substances, intentional self-harm, initial encounter: Secondary | ICD-10-CM

## 2017-07-03 DIAGNOSIS — F419 Anxiety disorder, unspecified: Secondary | ICD-10-CM

## 2017-07-03 DIAGNOSIS — M549 Dorsalgia, unspecified: Secondary | ICD-10-CM

## 2017-07-03 DIAGNOSIS — Z630 Problems in relationship with spouse or partner: Secondary | ICD-10-CM

## 2017-07-03 DIAGNOSIS — F17210 Nicotine dependence, cigarettes, uncomplicated: Secondary | ICD-10-CM

## 2017-07-03 DIAGNOSIS — G4700 Insomnia, unspecified: Secondary | ICD-10-CM

## 2017-07-03 DIAGNOSIS — T1491XA Suicide attempt, initial encounter: Secondary | ICD-10-CM

## 2017-07-03 DIAGNOSIS — F3131 Bipolar disorder, current episode depressed, mild: Secondary | ICD-10-CM

## 2017-07-03 MED ORDER — ENSURE ENLIVE PO LIQD
237.0000 mL | Freq: Three times a day (TID) | ORAL | Status: DC
  Administered 2017-07-03 – 2017-07-05 (×5): 237 mL via ORAL

## 2017-07-03 MED ORDER — ADULT MULTIVITAMIN W/MINERALS CH
1.0000 | ORAL_TABLET | Freq: Every day | ORAL | Status: DC
  Administered 2017-07-03 – 2017-07-05 (×3): 1 via ORAL
  Filled 2017-07-03 (×6): qty 1

## 2017-07-03 MED ORDER — ONDANSETRON 4 MG PO TBDP
4.0000 mg | ORAL_TABLET | Freq: Three times a day (TID) | ORAL | Status: DC | PRN
  Administered 2017-07-03: 4 mg via ORAL
  Filled 2017-07-03: qty 1

## 2017-07-03 NOTE — H&P (Signed)
Psychiatric Admission Assessment Adult  Patient Identification: Judy Graham  MRN:  [id number]  Date of Evaluation:  07/03/2017  Chief Complaint: Suicide attempt by overdose.  Principal Diagnosis: Bipolar disorder ([place])  Diagnosis:   Patient Active Problem List   Diagnosis Date Noted  . Bipolar disorder ([place]) [F31.9] 07/02/2017  . Suicidal overdose ([place]) [T50.902A] 07/01/2017  . Depression [F32.9]   . Tricyclic overdose, intentional self-harm, initial encounter ([place]) [T43.012A] 06/25/2017  . Acute respiratory failure ([place]) [J96.00] 06/25/2017  . Chronic hepatitis C without hepatic coma ([place]) [B18.2] 04/28/2017  . 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 Abdominal pain [R10.9] 07/01/2016  . Chronic headache [R51] 06/17/2016  . Bipolar and related disorder ([place]) [F31.9] 06/05/2015  . IUGR (intrauterine growth restriction) affecting care of mother [O36.5990] 08/14/2014  . Cigarette smoker [F17.210]   . MRSA (methicillin resistant staph aureus) culture positive [Z22.322] 05/26/2014  . Abnormal findings on antenatal screening [O28.9]   . Elevated AFP [R77.2]   . Choroid plexus cyst of fetus [O35.4YJ8]   . Abnormal MSAFP (maternal serum alpha-fetoprotein), elevated [O28.0]   . High risk pregnancy with high inhibin [O28.8, O09.899]   . Choroid plexus cyst [G93.0]   . UTI (urinary tract infection) in pregnancy in second trimester [O23.42] 03/08/2014  . Marijuana use [F12.90] 03/08/2014  . Abnormal quad screen [O28.0] 03/08/2014  . Late prenatal care affecting pregnancy in second trimester, antepartum [O09.32] 03/04/2014  . Scoliosis [M41.9] 03/04/2014  . Anxiety disorder [F41.9] 09/05/2013  . Adjustment disorder with mixed emotional features [F43.29] 09/01/2013  . S/P spinal fusion [Z98.1] 08/29/2011   History of Present Illness:(Per Md's ASRA): 27 y.o Caucasian female, single. Background history of Bipolar Disorder. Transferred  from the medical floor. Managed there after an overdosed on a TCA. Was intubated. Reports suicidal behavior was precipitated by an argument with her boyfriend. Says he has ended their relationship. Reports past history of impulsive behavior. She reports unspecific anxiety. Very focused on getting pain medication and Valium. No evidence of psychosis. No evidence of mania. No cognitive impairment. No access to weapons. No current suicidal thoughts. No current homicidal thoughts. No current thoughts of violence. I explored use of SSRI or traditional mood stabilizer. Does not want to take any other medication other than controlled medications.     Associated Signs/Symptoms:  Depression Symptoms:  depressed mood, anxiety,  (Hypo) Manic Symptoms:  Impulsivity, Labiality of Mood,  Anxiety Symptoms:  Excessive Worry,  Psychotic Symptoms:  Denies any psychotic symptoms  PTSD Symptoms: Denies  Total Time spent with patient: 1 hour  Past Psychiatric History: Bipolar disorder.  Is the patient at risk to self? Yes.    Has the patient been a risk to self in the past 6 months? Yes.    Has the patient been a risk to self within the distant past? Yes.    Is the patient a risk to others? No.  Has the patient been a risk to others in the past 6 months? No.  Has the patient been a risk to others within the distant past? No.   Prior Inpatient Therapy: Yes Prior Outpatient Therapy: Yes  Alcohol Screening: 1. How often do you have a drink containing alcohol?: Monthly or less(II'm an occassional drinker) 2. How many drinks containing alcohol do you have on a typical day when you are drinking?: 3 or 4 3. How often do you have six or more drinks on one occasion?: Never AUDIT-C Score: 2 4. How often during  the last year have you found that you were not able to stop drinking once you had started?: Never 5. How often during the last year have you failed to do what was normally expected from you becasue of  drinking?: Never 6. How often during the last year have you needed a first drink in the morning to get yourself going after a heavy drinking session?: Never 7. How often during the last year have you had a feeling of guilt of remorse after drinking?: Never 8. How often during the last year have you been unable to remember what happened the night before because you had been drinking?: Never 9. Have you or someone else been injured as a result of your drinking?: No 10. Has a relative or friend or a doctor or another health worker been concerned about your drinking or suggested you cut down?: No Alcohol Use Disorder Identification Test Final Score (AUDIT): 2 Substance Abuse History in the last 12 months:  Yes.    Consequences of Substance Abuse: Reviewed with patient. Medical Consequences:  Liver damage, Possible death by overdose Legal Consequences:  Arrests, jail time, Loss of driving privilege. Family Consequences:  Family discord, divorce and or separation.  Previous Psychotropic Medications: Yes   Psychological Evaluations: No   Past Medical History:  Past Medical History:  Diagnosis Date  . Scoliosis     Past Surgical History:  Procedure Laterality Date  . BACK SURGERY    . KNEE SURGERY     Family History:  Family History  Problem Relation Age of Onset  . Cancer Mother   . Cancer Maternal Grandmother   . Diabetes Paternal Grandmother   . Stroke Paternal Grandmother   . Hypertension Father    Family Psychiatric  History:   Tobacco Screening: Have you used any form of tobacco in the last 30 days? (Cigarettes, Smokeless Tobacco, Cigars, and/or Pipes): Yes Tobacco use, Select all that apply: 5 or more cigarettes per day(1 pkt / day "since I was 27 years old") Are you interested in Tobacco Cessation Medications?: No, patient refused Counseled patient on smoking cessation including recognizing danger situations, developing coping skills and basic information about quitting  provided: Yes Social History:  Social History   Substance and Sexual Activity  Alcohol Use Yes   Comment: occ     Social History   Substance and Sexual Activity  Drug Use No    Additional Social History:  Allergies:   Allergies  Allergen Reactions  . Clindamycin/Lincomycin Anaphylaxis and Swelling  . Gabapentin Shortness Of Breath and Swelling  . Flexeril [Cyclobenzaprine]     Restless leg syndrome, numbness  . Gluten Meal    Lab Results:  Results for orders placed or performed during the hospital encounter of 06/25/17 (from the past 48 hour(s))  Basic metabolic panel     Status: None   Collection Time: 07/02/17  9:36 AM  Result Value Ref Range   Sodium 138 135 - 145 mmol/L   Potassium 4.3 3.5 - 5.1 mmol/L   Chloride 105 101 - 111 mmol/L   CO2 24 22 - 32 mmol/L   Glucose, Bld 99 65 - 99 mg/dL   BUN 11 6 - 20 mg/dL   Creatinine, Ser 0.53 0.44 - 1.00 mg/dL   Calcium 9.0 8.9 - 10.3 mg/dL   GFR calc non Af Amer >60 >60 mL/min   GFR calc Af Amer >60 >60 mL/min    Comment: (NOTE) The eGFR has been calculated using the CKD EPI  equation. This calculation has not been validated in all clinical situations. eGFR's persistently <60 mL/min signify possible Chronic Kidney Disease.    Anion gap 9 5 - 15    Comment: Performed at [facility] [address], [place], [place] [address]  Magnesium     Status: None   Collection Time: 07/02/17  9:36 AM  Result Value Ref Range   Magnesium 2.1 1.7 - 2.4 mg/dL    Comment: Performed at [facility] [address], [place], [place] [address]   Blood Alcohol level:  Lab Results  Component Value Date   ETH <10 06/25/2017   ETH <10 00/76/2263   Metabolic Disorder Labs:  No results found for: HGBA1C, MPG No results found for: PROLACTIN Lab Results  Component Value Date   TRIG 77 06/26/2017   Current Medications: Current Facility-Administered Medications  Medication Dose Route Frequency Provider Last Rate Last Dose   . acetaminophen (TYLENOL) tablet 650 mg  650 mg Oral Q4H PRN Okonkwo, Justina A, NP   650 mg at 07/02/17 2156  . alum & mag hydroxide-simeth (MAALOX/MYLANTA) 200-200-20 MG/5ML suspension 30 mL  30 mL Oral Q4H PRN Okonkwo, Justina A, NP      . amoxicillin-clavulanate (AUGMENTIN) 875-125 MG per tablet 1 tablet  1 tablet Oral Q12H Okonkwo, Justina A, NP   1 tablet at 07/03/17 0751  . butalbital-acetaminophen-caffeine (FIORICET, ESGIC) 50-325-40 MG per tablet 1 tablet  1 tablet Oral Q6H PRN Hughie Closs A, NP   1 tablet at 07/03/17 1207  . diazepam (VALIUM) tablet 5 mg  5 mg Oral Daily PRN Lu Duffel, Justina A, NP   5 mg at 07/03/17 0751  . famotidine (PEPCID) tablet 20 mg  20 mg Oral BID Pennelope Bracken, MD   20 mg at 07/03/17 1652  . feeding supplement (ENSURE ENLIVE) (ENSURE ENLIVE) liquid 237 mL  237 mL Oral TID BM Pennelope Bracken, MD   237 mL at 07/03/17 1443  . HYDROcodone-acetaminophen (NORCO/VICODIN) 5-325 MG per tablet 1 tablet  1 tablet Oral Q6H PRN Lu Duffel, Justina A, NP   1 tablet at 07/03/17 1443  . hydrOXYzine (ATARAX/VISTARIL) tablet 25 mg  25 mg Oral TID PRN Lu Duffel, Justina A, NP   25 mg at 07/03/17 1546  . magnesium hydroxide (MILK OF MAGNESIA) suspension 30 mL  30 mL Oral Daily PRN Okonkwo, Justina A, NP      . multivitamin with minerals tablet 1 tablet  1 tablet Oral Daily Pennelope Bracken, MD   1 tablet at 07/03/17 1208  . OLANZapine (ZYPREXA) tablet 5 mg  5 mg Oral QHS Okonkwo, Justina A, NP   5 mg at 07/02/17 2153  . ondansetron (ZOFRAN-ODT) disintegrating tablet 4 mg  4 mg Oral Q8H PRN Artist Beach, MD   4 mg at 07/03/17 1715  . traZODone (DESYREL) tablet 50 mg  50 mg Oral QHS,MR X 1 Lindon Romp A, NP   50 mg at 07/02/17 2308   PTA Medications: Medications Prior to Admission  Medication Sig Dispense Refill Last Dose  . amoxicillin-clavulanate (AUGMENTIN) 875-125 MG tablet Take 1 tablet by mouth every 12 (twelve) hours for 1 day. 2 tablet 0    . OLANZapine (ZYPREXA) 5 MG tablet Take 1 tablet (5 mg total) by mouth at bedtime. 30 tablet 0    Musculoskeletal: Strength & Muscle Tone: within normal limits Gait & Station: normal Patient leans: N/A  Psychiatric Specialty Exam: Physical Exam  Constitutional: She appears well-developed.  Presents, thin & emaciated  HENT:  Head: Normocephalic.  Eyes: Pupils are equal, round, and reactive to light.  Neck: Normal range of motion.  Cardiovascular:  Elevated pulse rate: (116)  Respiratory: Effort normal.  GI: Soft.  Genitourinary:  Genitourinary Comments: Deferred  Musculoskeletal: Normal range of motion.  Neurological: She is alert.  Skin: Skin is warm.    Review of Systems  Constitutional: Negative for malaise/fatigue.  HENT: Negative.   Eyes: Negative.   Respiratory: Negative.   Cardiovascular: Negative for chest pain, orthopnea, claudication and leg swelling.       Elevated pulse rate (116).  Gastrointestinal: Negative.   Genitourinary: Negative.   Musculoskeletal: Positive for back pain, joint pain and myalgias.  Skin: Negative.   Neurological: Negative.   Endo/Heme/Allergies: Negative.   Psychiatric/Behavioral: Positive for depression and substance abuse (UDS (+) for Benzodiazepine, Opioid & THC). Negative for hallucinations, memory loss and suicidal ideas. The patient is nervous/anxious and has insomnia.     Blood pressure 102/72, pulse (!) 116, temperature (!) 97.5 F (36.4 C), temperature source Oral, resp. rate 16, height 5' 3"  (1.6 m), weight 34.9 kg (77 lb), last menstrual period 06/10/2017, SpO2 97 %, unknown if currently breastfeeding.Body mass index is 13.64 kg/m.  General Appearance: Thin frame,   Eye Contact:  Good  Speech:  Clear and Coherent  Volume:  Normal  Mood:  Anxious and Depressed  Affect:  Appropriate  Thought Process:  Coherent and Descriptions of Associations: Intact  Orientation:  Full (Time, Place, and Person)  Thought Content:  Logical   Suicidal Thoughts:  No  Homicidal Thoughts:  No  Memory:  Immediate;   Good Recent;   Good Remote;   Good  Judgement:  Fair  Insight:  Fair  Psychomotor Activity:  Normal  Concentration:  Concentration: Good and Attention Span: Good  Recall:  Good  Fund of Knowledge:  Good  Language:  Good  Akathisia:  Negative  Handed:  Right  AIMS (if indicated):     Assets:  Communication Skills Desire for Improvement  ADL's:  Intact  Cognition:  WNL  Sleep:  Number of Hours: 6.5   Treatment Plan Summary: Daily contact with patient to assess and evaluate symptoms and progress in treatment: See Md;s SRA Treatment plan.  Observation Level/Precautions:  15 minute checks  Laboratory:  Per ED, UDS (+) for benzodiazepine & THC  Psychotherapy: Group sessions  Medications: See MAR   Consultations: As needed.  Discharge Concerns: Safety, Mood control   Estimated LOS: 3-5 days  Other: Admit to 500 hall.   Physician Treatment Plan for Primary Diagnosis: Bipolar disorder ([place])  Long Term Goal(s): Improvement in symptoms so as ready for discharge  Short Term Goals: Ability to identify changes in lifestyle to reduce recurrence of condition will improve, Ability to disclose and discuss suicidal ideas and Ability to demonstrate self-control will improve  Physician Treatment Plan for Secondary Diagnosis: Principal Problem:   Bipolar disorder ([place])  Long Term Goal(s): Improvement in symptoms so as ready for discharge  Short Term Goals: Ability to identify and develop effective coping behaviors will improve, Compliance with prescribed medications will improve and Ability to identify triggers associated with substance abuse/mental health issues will improve  I certify that inpatient services furnished can reasonably be expected to improve the patient's condition.    Lindell Spar, NP, PMHNP, FNP-BC. 3/18/20196:18 PM
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
## 2017-07-03 NOTE — Progress Notes (Signed)
Adult Psychoeducational Group Note  Date:  07/03/2017 Time:  1600  Group Topic/Focus:  Goals Group:   The focus of this group is to help patients establish daily goals to achieve during treatment and discuss how the patient can incorporate goal setting into their daily lives to aide in recovery.  Participation Level:  Active  Participation Quality:  Appropriate  Affect:  Appropriate  Cognitive:  Appropriate  Insight: Appropriate  Engagement in Group:  Engaged  Modes of Intervention:  Discussion  Additional Comments:  Goal was to work talk to the doctor about meds for bipolar.  Kanylah Muench L

## 2017-07-03 NOTE — Tx Team (Addendum)
Interdisciplinary Treatment and Diagnostic Plan Update  07/03/2017 Time of Session: 11:28 AM  Judy Graham MRN: [id number]  Principal Diagnosis: Bipolar disorder [facility])  Secondary Diagnoses: Active Problems:   Bipolar disorder ([place])   Current Medications:  Current Facility-Administered Medications  Medication Dose Route Frequency Provider Last Rate Last Dose  . acetaminophen (TYLENOL) tablet 650 mg  650 mg Oral Q4H PRN Okonkwo, Justina A, NP   650 mg at 07/02/17 2156  . alum & mag hydroxide-simeth (MAALOX/MYLANTA) 200-200-20 MG/5ML suspension 30 mL  30 mL Oral Q4H PRN Okonkwo, Justina A, NP      . amoxicillin-clavulanate (AUGMENTIN) 875-125 MG per tablet 1 tablet  1 tablet Oral Q12H Okonkwo, Justina A, NP   1 tablet at 07/03/17 0751  . butalbital-acetaminophen-caffeine (FIORICET, ESGIC) 50-325-40 MG per tablet 1 tablet  1 tablet Oral Q6H PRN Hughie Closs A, NP   1 tablet at 07/03/17 0603  . diazepam (VALIUM) tablet 5 mg  5 mg Oral Daily PRN Lu Duffel, Justina A, NP   5 mg at 07/03/17 0751  . famotidine (PEPCID) tablet 20 mg  20 mg Oral BID Pennelope Bracken, MD   20 mg at 07/03/17 0751  . feeding supplement (ENSURE ENLIVE) (ENSURE ENLIVE) liquid 237 mL  237 mL Oral TID BM [place], Randa Ngo, MD      . HYDROcodone-acetaminophen (NORCO/VICODIN) 5-325 MG per tablet 1 tablet  1 tablet Oral Q6H PRN Lu Duffel, Justina A, NP   1 tablet at 07/03/17 0751  . hydrOXYzine (ATARAX/VISTARIL) tablet 25 mg  25 mg Oral TID PRN Hughie Closs A, NP   25 mg at 07/02/17 2153  . magnesium hydroxide (MILK OF MAGNESIA) suspension 30 mL  30 mL Oral Daily PRN Okonkwo, Justina A, NP      . multivitamin with minerals tablet 1 tablet  1 tablet Oral Daily Rainville, Christopher T, MD      . OLANZapine (ZYPREXA) tablet 5 mg  5 mg Oral QHS Okonkwo, Justina A, NP   5 mg at 07/02/17 2153  . traZODone (DESYREL) tablet 50 mg  50 mg Oral QHS,MR X 1 Lindon Romp A, NP   50 mg at 07/02/17 2308    PTA  Medications: Medications Prior to Admission  Medication Sig Dispense Refill Last Dose  . amoxicillin-clavulanate (AUGMENTIN) 875-125 MG tablet Take 1 tablet by mouth every 12 (twelve) hours for 1 day. 2 tablet 0   . OLANZapine (ZYPREXA) 5 MG tablet Take 1 tablet (5 mg total) by mouth at bedtime. 30 tablet 0     Patient Stressors: Health problems Marital or family conflict  Patient Strengths: Ability for insight Average or above average intelligence Capable of independent living Communication skills Motivation for treatment/growth Supportive family/friends  Treatment Modalities: Medication Management, Group therapy, Case management,  1 to 1 session with clinician, Psychoeducation, Recreational therapy.   Physician Treatment Plan for Primary Diagnosis: Bipolar disorder ([place]) Long Term Goal(s): Improvement in symptoms so as ready for discharge  Short Term Goals: Ability to identify changes in lifestyle to reduce recurrence of condition will improve Ability to disclose and discuss suicidal ideas Ability to demonstrate self-control will improve Ability to identify and develop effective coping behaviors will improve Compliance with prescribed medications will improve Ability to identify triggers associated with substance abuse/mental health issues will improve  Medication Management: Evaluate patient's response, side effects, and tolerance of medication regimen.  Therapeutic Interventions: 1 to 1 sessions, Unit Group sessions and Medication administration.  Evaluation of Outcomes: Progressing  Physician Treatment Plan  for Secondary Diagnosis: Active Problems:   Bipolar disorder ([place])   Long Term Goal(s): Improvement in symptoms so as ready for discharge  Short Term Goals: Ability to identify changes in lifestyle to reduce recurrence of condition will improve Ability to disclose and discuss suicidal ideas Ability to demonstrate self-control will improve Ability to identify and  develop effective coping behaviors will improve Compliance with prescribed medications will improve Ability to identify triggers associated with substance abuse/mental health issues will improve  Medication Management: Evaluate patient's response, side effects, and tolerance of medication regimen.  Therapeutic Interventions: 1 to 1 sessions, Unit Group sessions and Medication administration.  Evaluation of Outcomes: Progressing   RN Treatment Plan for Primary Diagnosis: Bipolar disorder ([place]) Long Term Goal(s): Knowledge of disease and therapeutic regimen to maintain health will improve  Short Term Goals: Ability to identify and develop effective coping behaviors will improve and Compliance with prescribed medications will improve  Medication Management: RN will administer medications as ordered by provider, will assess and evaluate patient's response and provide education to patient for prescribed medication. RN will report any adverse and/or side effects to prescribing provider.  Therapeutic Interventions: 1 on 1 counseling sessions, Psychoeducation, Medication administration, Evaluate responses to treatment, Monitor vital signs and CBGs as ordered, Perform/monitor CIWA, COWS, AIMS and Fall Risk screenings as ordered, Perform wound care treatments as ordered.  Evaluation of Outcomes: Progressing   LCSW Treatment Plan for Primary Diagnosis: Bipolar disorder ([place]) Long Term Goal(s): Safe transition to appropriate next level of care at discharge, Engage patient in therapeutic group addressing interpersonal concerns.  Short Term Goals: Engage patient in aftercare planning with referrals and resources  Therapeutic Interventions: Assess for all discharge needs, 1 to 1 time with Social worker, Explore available resources and support systems, Assess for adequacy in community support network, Educate family and significant other(s) on suicide prevention, Complete Psychosocial Assessment,  Interpersonal group therapy.  Evaluation of Outcomes: Met  Return home, follow up outpt   Progress in Treatment: Attending groups: Yes Participating in groups: Yes Taking medication as prescribed: Yes Toleration medication: Yes, no side effects reported at this time Family/Significant other contact made: No Patient understands diagnosis: Yes AEB asking for help with mental health symptoms. Discussing patient identified problems/goals with staff: Yes Medical problems stabilized or resolved: Yes Denies suicidal/homicidal ideation: Yes Issues/concerns per patient self-inventory: None Other: N/A  New problem(s) identified: None identified at this time.   New Short Term/Long Term Goal(s): "I want help in managing my depression, but this is the best I have felt in a long time.  I think it's because of the medication they started in the ER. Also, I hear static sometimes."  Discharge Plan or Barriers:   Reason for Continuation of Hospitalization: Depression Hallucinations  Medication stabilization Suicidal ideation   Estimated Length of Stay: 3/22  Attendees: Patient: Judy Graham 07/03/2017  11:28 AM  Physician: Maris Berger, MD 07/03/2017  11:28 AM  Nursing: Theophilus Bones, RN 07/03/2017  11:28 AM  RN Care Manager: Lars Pinks, RN 07/03/2017  11:28 AM  Social Worker: Ripley Fraise 07/03/2017  11:28 AM  Recreational Therapist: Winfield Cunas 07/03/2017  11:28 AM  Other: Norberto Sorenson 07/03/2017  11:28 AM  Other: Hardie Pulley., NP 07/03/2017  11:28 AM    Scribe for Treatment Team:  Roque Lias LCSW 07/03/2017 11:28 AM

## 2017-07-03 NOTE — Progress Notes (Signed)
Adult Psychoeducational Group Note  Date:  07/03/2017 Time:  9:29 AM  Group Topic/Focus:  Goals Group:   The focus of this group is to help patients establish daily goals to achieve during treatment and discuss how the patient can incorporate goal setting into their daily lives to aide in recovery.  Participation Level:  Active  Participation Quality:  Appropriate  Affect:  Appropriate  Cognitive:  Appropriate  Insight: Appropriate  Engagement in Group:  Engaged  Modes of Intervention:  Discussion and Education  Additional Comments:  Pt attended the Goals Group this morning and was able to share positively. Pt stated that she would like to speak with the MD.  Evlyn ClinesRINITY, Jeannine Pennisi E 07/03/2017, 9:29 AM

## 2017-07-03 NOTE — Progress Notes (Signed)
NUTRITION ASSESSMENT  Pt identified as at risk on the Malnutrition Screen Tool  INTERVENTION: 1. Educated patient on the importance of nutrition and encouraged intake of food and beverages. 2. Discussed weight goals. 3. Supplements:  - will order Ensure Enlive TID, each supplement provides 350 kcal and 20 grams of protein - will order daily multivitamin with minerals.   NUTRITION DIAGNOSIS: Unintentional weight loss related to sub-optimal intake as evidenced by pt report.   Goal: Pt to meet >/= 90% of their estimated nutrition needs.  Monitor:  PO intake  Assessment:  Pt admitted for OD and was medically cleared [facility] d[facility]c [facility]. During admission, she had been intubated and was extubated on 3/11. Pt was admitted to the hospital for OD in [place]. Pt with hx of scoliosis and depression.   She reports that she has lost 10 lbs in the past 2 month. This could not be confirmed from weight hx available in the chart. Underweight status is concerning regardless of if weight loss has or has not occurred recently.  Will order supplements as outlined above. Continue to encourage PO intakes of meals, supplements, and snacks.     27 y.o. female  Height: Ht Readings from Last 1 Encounters:  07/02/17 5\' 3"  (1.6 m)    Weight: Wt Readings from Last 1 Encounters:  07/02/17 77 lb (34.9 kg)    Weight Hx: Wt Readings from Last 10 Encounters:  07/02/17 77 lb (34.9 kg)  07/01/17 77 lb 1.6 oz (35 kg)  06/24/17 100 lb (45.4 kg)  04/30/17 80 lb (36.3 kg)  04/17/15 100 lb (45.4 kg)  08/14/14 119 lb (54 kg)  08/14/14 119 lb (54 kg)  07/21/14 114 lb 14.4 oz (52.1 kg)  06/25/14 110 lb 8 oz (50.1 kg)  06/06/14 108 lb 11.2 oz (49.3 kg)    BMI:  Body mass index is 13.64 kg/m. Pt meets criteria for underweight based on current BMI.  Estimated Nutritional Needs: Kcal: 25-30 kcal/kg Protein: > 1 gram protein/kg Fluid: 1 ml/kcal  Diet Order: Diet regular Room service  appropriate? Yes; Fluid consistency: Thin Pt is also offered choice of unit snacks mid-morning and mid-afternoon.  Pt is eating as desired.   Lab results and medications reviewed.      Trenton GammonJessica Joliene Salvador, MS, RD, LDN, [facility] Inpatient Clinical Dietitian Pager # [phone] After hours/weekend pager # [phone]

## 2017-07-03 NOTE — Plan of Care (Signed)
  Safety: Periods of time without injury will increase 07/03/2017 1159 - Progressing by Ferrel Loganollazo, Januel Doolan A, RN   Medication: Compliance with prescribed medication regimen will improve 07/03/2017 1159 - Progressing by Ferrel Loganollazo, Evangaline Jou A, RN

## 2017-07-03 NOTE — [facility] Group Notes (Addendum)
LCSW Group Therapy Note   07/03/2017 1:15pm   Type of Therapy and Topic:  Group Therapy:  Overcoming Obstacles   Participation Level:  Active   Description of Group:    In this group patients will be encouraged to explore what they see as obstacles to their own wellness and recovery. They will be guided to discuss their thoughts, feelings, and behaviors related to these obstacles. The group will process together ways to cope with barriers, with attention given to specific choices patients can make. Each patient will be challenged to identify changes they are motivated to make in order to overcome their obstacles. This group will be process-oriented, with patients participating in exploration of their own experiences as well as giving and receiving support and challenge from other group members.   Therapeutic Goals: 1. Patient will identify personal and current obstacles as they relate to admission. 2. Patient will identify barriers that currently interfere with their wellness or overcoming obstacles.  3. Patient will identify feelings, thought process and behaviors related to these barriers. 4. Patient will identify two changes they are willing to make to overcome these obstacles:      Summary of Patient Progress  Stayed the entire time, engaged throughout.  "I just found out my husband says I cannot return home.  Technically I think I could because I am on the lease, but I don't think it would be worth it.  I want to do this so that I can prove to myself that I can take care of myself.  I will call my father to see if I can stay there for awhile, and if not I will go to a shelter. I've been homeless before.  I know I can do it again.And I need to apply for disability too."   Therapeutic Modalities:   Cognitive Behavioral Therapy Solution Focused Therapy Motivational Interviewing Relapse Prevention Therapy  Ida RogueRodney B Holt Woolbright, LCSW 07/03/2017 3:22 PM

## 2017-07-03 NOTE — [facility] Suicide Risk Assessment (Signed)
[facility] Admission Suicide Risk Assessment   Nursing information obtained from:    Demographic factors:    Current Mental Status:    Loss Factors:    Historical Factors:    Risk Reduction Factors:     Total Time spent with patient: 45 minutes Principal Problem: MDD                                  Diagnosis:   Patient Active Problem List   Diagnosis Date Noted  . Bipolar disorder (HCC) [F31.9] 07/02/2017  . Suicidal overdose (HCC) [T50.902A] 07/01/2017  . Depression [F32.9]   . Tricyclic overdose, intentional self-harm, initial encounter (HCC) [T43.012A] 06/25/2017  . Acute respiratory failure (HCC) [J96.00] 06/25/2017  . Chronic hepatitis C without hepatic coma (HCC) [B18.2] 04/28/2017  . 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 Abdominal pain [R10.9] 07/01/2016  . Chronic headache [R51] 06/17/2016  . Bipolar and related disorder (HCC) [F31.9] 06/05/2015  . IUGR (intrauterine growth restriction) affecting care of mother [O36.5990] 08/14/2014  . Cigarette smoker [F17.210]   . MRSA (methicillin resistant staph aureus) culture positive [Z22.322] 05/26/2014  . Abnormal findings on antenatal screening [O28.9]   . Elevated AFP [R77.2]   . Choroid plexus cyst of fetus [O35.0XX0]   . Abnormal MSAFP (maternal serum alpha-fetoprotein), elevated [O28.0]   . High risk pregnancy with high inhibin [O28.8, O09.899]   . Choroid plexus cyst [G93.0]   . UTI (urinary tract infection) in pregnancy in second trimester [O23.42] 03/08/2014  . Marijuana use [F12.90] 03/08/2014  . Abnormal quad screen [O28.0] 03/08/2014  . Late prenatal care affecting pregnancy in second trimester, antepartum [O09.32] 03/04/2014  . Scoliosis [M41.9] 03/04/2014  . Anxiety disorder [F41.9] 09/05/2013  . Adjustment disorder with mixed emotional features [F43.29] 09/01/2013  . S/P spinal fusion [Z98.1] 08/29/2011   Subjective Data:   27 y.o Caucasian female, single.  Background history of Bipolar Disorder. Transferred from the medical floor. Managed there after an overdosed on a TCA. Was intubated. Reports suicidal behavior was precipitated by an argument with her boyfriend. Says he has ended their relationship. Reports past history of impulsive behavior. She reports unspecific anxiety. Very focused on getting pain medication and Valium. No evidence of psychosis. No evidence of mania. No cognitive impairment. No access to weapons. No current suicidal thoughts. No current homicidal thoughts. No current thoughts of violence. I explored use of SSRI or traditional mood stabilizer. Does not want to take any other medication other than controlled medications.   Continued Clinical Symptoms:  Alcohol Use Disorder Identification Test Final Score (AUDIT): 2 The "Alcohol Use Disorders Identification Test", Guidelines for Use in Primary Care, Second Edition.  World Science writer [facility]). Score between 0-7:  no or low risk or alcohol related problems. Score between 8-15:  moderate risk of alcohol related problems. Score between 16-19:  high risk of alcohol related problems. Score 20 or above:  warrants further diagnostic evaluation for alcohol dependence and treatment.   CLINICAL FACTORS:   Alcohol/Substance Abuse/Dependencies   Musculoskeletal: Strength & Muscle Tone: within normal limits Gait & Station: normal Patient leans: N/A  Psychiatric Specialty Exam: Physical Exam  Constitutional: No distress.  Respiratory: Effort normal and breath sounds normal.  Neurological: She is alert.  Skin: She is not diaphoretic.  Psychiatric:  As above     ROS  Blood pressure 102/72, pulse (!) 116, temperature (!) 97.5 F (36.4  C), temperature source Oral, resp. rate 16, height 5\' 3"  (1.6 m), weight 34.9 kg (77 lb), last menstrual period 06/10/2017, SpO2 97 %, unknown if currently breastfeeding.Body mass index is 13.64 kg/m.  General Appearance: In hospital clothing,  frail, not in any distress.   Eye Contact:  Good  Speech:  Clear and Coherent and Normal Rate  Volume:  Normal  Mood:  Euthymic  Affect:  Appropriate and Restricted  Thought Process:  Linear  Orientation:  Full (Time, Place, and Person)  Thought Content:  No delusional theme. No preoccupation with violent thoughts. No negative ruminations. No obsession.  No hallucination in any modality.   Suicidal Thoughts:  None currently  Homicidal Thoughts:  No  Memory:  Immediate;   Good Recent;   Good Remote;   Good  Judgement:  Fair  Insight:  Shallow  Psychomotor Activity:  Normal  Concentration:  Concentration: Fair and Attention Span: Good  Recall:  Good  Fund of Knowledge:  Good  Language:  Good  Akathisia:  Negative  Handed:    AIMS (if indicated):     Assets:  Communication Skills  ADL's:  Intact  Cognition:  WNL  Sleep:  Number of Hours: 6.5      COGNITIVE FEATURES THAT CONTRIBUTE TO RISK:  None    SUICIDE RISK:   Minimal: No identifiable suicidal ideation.  Patients presenting with no risk factors but with morbid ruminations; may be classified as minimal risk based on the severity of the depressive symptoms  PLAN OF CARE:  1. Continue home medical medications at dose she was transferred on.  2. Q 15 minute check for suicide. 3. Monitor mood, behavior and interaction with peers 4. SW would gather collateral from her family    I certify that inpatient services furnished can reasonably be expected to improve the patient's condition.   Georgiann CockerVincent A Izediuno, MD 07/03/2017, 3:22 PM

## 2017-07-03 NOTE — Progress Notes (Addendum)
Patient ID: Judy Graham, female   DOB: 10-Mar-1991, 27 y.o.   MRN: [id number]  Nursing Progress Note [id number]  Data: Patient presents with flat/anxious affect and mood. Patient taking scheduled medications without incident. Patient has had many somatic complaints including headache, anxiety, lower back pain and nausea and requests PRN medications frequently. Patient stated her goal for today is to talk to the doctor about her medications. Patient denies SI/HI/AVH. Patient contracts for safety on the unit at this time. Patient completed self-inventory sheet and rated depression, hopelessness, and anxiety 0,0,2 respectively.   Action: Patient educated about and provided medication per provider's orders. Patient safety maintained with q15 min safety checks. High fall risk precautions in place. Emotional support given. 1:1 interaction and active listening provided. Patient encouraged to attend meals and groups. Labs, vital signs and patient behavior monitored throughout shift. Patient encouraged to work on treatment plan and goals.  Response: Patient receptive to interaction with nurse. Patient remains safe on the unit at this time. Patient is interacting appropriately with peers and attending groups. Will continue to support and monitor.

## 2017-07-03 NOTE — Progress Notes (Signed)
Recreation Therapy Notes  INPATIENT RECREATION THERAPY ASSESSMENT  Patient Details Name: Judy Graham MRN: [id number] DOB: May 22, 1990 Today's Date: 07/03/2017       Information Obtained From: Patient  Able to Participate in Assessment/Interview: Yes  Patient Presentation: Responsive  Reason for Admission (Per Patient): Suicide Attempt Patient reports being admitted into the hospital because of suicide attempt due to depression and PTSD.   Patient Stressors: Relationship Patient reports her relationship as a stressor because her fiance has been cheating on her with her best friend  Coping Skills:   Isolation, Arguments, Aggression, Substance Abuse, Meditate, Deep Breathing, Journal, Write, TV, Read  Leisure Interests (2+):  Playing with kids, coloring, talking to others   Frequency of Recreation/Participation: Weekly   Awareness of Community Resources:  Yes   Community Resources:  Movies, Water quality scientistarks, Tree surgeonMall   Current Use: Yes   Expressed Interest in [employer] CorporationCommunity Resource Information: No   [place] of Residence[place]  Patient Main Form of Transportation: Set designerCar   Patient Strengths:  Being a mom, good listener, good talker   Patient Identified Areas of Improvement:  To get on the right medicines   Patient Goal for Hospitalization:  To leave   Current SI (including self-harm):  No  Current HI:  No  Current AVH: No  Staff Intervention Plan: Group Attendance, Collaborate with Interdisciplinary Treatment Team  Consent to Intern Participation: Yes  Judy Graham, Recreation Therapy Intern   Judy Graham 07/03/2017, 3:26 PM

## 2017-07-03 NOTE — Progress Notes (Signed)
Recreation Therapy Notes  Date: 3.18.19 Time: 10:00 a.m.  Location: [address] Dayroom   Group Topic: Personal Development: Coping Skills   Goal Area(s) Addresses:  Goal 1.1: To improve coping skills  - Group will increase awareness on coping skills  - Group will identify at least three healthy coping skills they have  - Group will be able to identify how coping skills can improve their wellness  Behavioral Response: Appropriate   Intervention: Craft  Activity: Coping Strategies Fortune Teller: Patients received a Coping Strategies Fortune Teller. Patients were given fifteen minutes to color and list their top 8 healthy coping strategies. Patients then cut out their fortune tellers and followed Recreation Therapy Intern instructions on how to assemble their fortune teller. Once put together, patients had the opportunity to practice their coping strategies by using their fortune tellers with a partner.   Education: Coping Skills   Education Outcome: Acknowledges Education  Clinical Observations/Feedback: Patient attended and participated appropriately during Recreation Therapy group tx. Patient was able to identify at least three healthy coping skills he has. Patient actively listened during introduction and closing discussion. Patient successfully met Goal 1.1 (See Above).  Ranell Patrick, Recreation Therapy Intern   Ranell Patrick 07/03/2017 12:56 PM

## 2017-07-04 DIAGNOSIS — T50992A Poisoning by other drugs, medicaments and biological substances, intentional self-harm, initial encounter: Secondary | ICD-10-CM

## 2017-07-04 DIAGNOSIS — K219 Gastro-esophageal reflux disease without esophagitis: Secondary | ICD-10-CM

## 2017-07-04 DIAGNOSIS — F314 Bipolar disorder, current episode depressed, severe, without psychotic features: Secondary | ICD-10-CM

## 2017-07-04 DIAGNOSIS — G43909 Migraine, unspecified, not intractable, without status migrainosus: Secondary | ICD-10-CM

## 2017-07-04 MED ORDER — DIAZEPAM 5 MG PO TABS
5.0000 mg | ORAL_TABLET | Freq: Every day | ORAL | Status: DC | PRN
  Administered 2017-07-04 – 2017-07-05 (×2): 5 mg via ORAL
  Filled 2017-07-04 (×2): qty 1

## 2017-07-04 MED ORDER — HYDROXYZINE HCL 25 MG PO TABS
25.0000 mg | ORAL_TABLET | Freq: Three times a day (TID) | ORAL | Status: DC | PRN
  Administered 2017-07-04 (×2): 25 mg via ORAL
  Filled 2017-07-04 (×2): qty 1

## 2017-07-04 NOTE — Progress Notes (Signed)
  DATA ACTION RESPONSE  Objective- Pt. is visible in the dayroom; Pt attended wrap-up group this evening and participated.Presents with a flat/anxious affect and mood.Pt states she hopes to be discharge tomorrow. C/o of insomnia, anxiety, and pain.  Subjective- Denies having any SI/HI/AVH at this time. Rates pain 8/10; generalized. Is cooperative and remains safe on the unit.  1:1 interaction in private to establish rapport. Encouragement, education, & support given from staff.  PRN Vicodin, Fioricet, Trazodone, and vistraril requested and will re-eval accordingly.   Safety maintained with Q 15 checks. Continue with POC.

## 2017-07-04 NOTE — Progress Notes (Signed)
Adult Psychoeducational Group Note  Date:  07/04/2017 Time:  8:56 PM  Group Topic/Focus:  Wrap-Up Group:   The focus of this group is to help patients review their daily goal of treatment and discuss progress on daily workbooks.  Participation Level:  Active  Participation Quality:  Appropriate  Affect:  Appropriate  Cognitive:  Appropriate  Insight: Appropriate  Engagement in Group:  Engaged  Modes of Intervention:  Discussion  Additional Comments: The patient expressed that she rates today a 9.The patient also said that she was ready for discharge and went to all groups.  Octavio Mannshigpen, Tiawanna Luchsinger Lee 07/04/2017, 8:56 PM
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
## 2017-07-04 NOTE — Progress Notes (Signed)
Recreation Therapy Notes   Date: 3.19.19 Time: 10:00 a.m.  Location: [address] Dayroom   Group Topic: Self-Expression, Radiographer, therapeutic, Leisure Education   Goal Area(s) Addresses:  - Patient will identify the importance of self-expression  - Patient will identify how to express themselves  - Patient will identify how art can be used as a Technical sales engineer  - Patient will report enjoyment and feeling of relaxation from activity  - Patient will participate in Recreation Therapy group treatment   Behavioral Response: Engaged   Intervention: Art   Activity: Expressive Arts: Patients had the opportunity to express themselves through the use of art by painting their feelings or thoughts on a canvas   Education:Self-Expression, Coping Skills, Leisure Education, Stress Management   Education Outcome: Acknowledges education  Clinical Observations/Feedback: Patient attended and participated appropriately during Recreation Therapy group treatment successfully identifying the importance of self-expression. Patient was able to identify how to express themselves positively. Patient was able to identify how art could be used as a Technical sales engineer. Patient reported feeling a sense of relaxation and a distraction from thoughts. Patient successfully met Goal 1.1 (see above).   Ranell Patrick, Recreation Therapy Intern  Ranell Patrick 07/04/2017 12:47 PM
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
## 2017-07-04 NOTE — Progress Notes (Signed)
Pt presents with an animated affect and anxious mood. Pt rates depression 4/10. Anxiety 2/10. Pt denies SI/HI. Pt reported fair sleep last night. Pt goal for today is to discuss discharge planning and to talk to the doctor. Pt appears to be med seeking throughout the day as she's frequently asking for pain meds or benzo's. PRN meds administered as ordered per MD. Orders reviewed with pt. Verbal support provided. Pt encouraged to attend groups. 15 minute checks performed for safety.

## 2017-07-04 NOTE — Progress Notes (Signed)
  D: When asked about her day pt stated, "emotional". Stated, "I haven't felt this good physically and emotionally for all most a year". When discharged pt plans to continue to live with her mother. Pt has no questions or concerns.    A:  Support and encouragement was offered. 15 min checks continued for safety.  R: Pt remains safe.

## 2017-07-05 MED ORDER — HYDROCODONE-ACETAMINOPHEN 5-325 MG PO TABS: 1.0000 | ORAL_TABLET | Freq: Four times a day (QID) | ORAL | 0 refills | Status: DC | PRN

## 2017-07-05 MED ORDER — DIAZEPAM 5 MG PO TABS: 5.0000 mg | ORAL_TABLET | Freq: Every day | ORAL | 0 refills | Status: DC | PRN

## 2017-07-05 MED ORDER — FAMOTIDINE 20 MG PO TABS: 20.0000 mg | ORAL_TABLET | Freq: Two times a day (BID) | ORAL | 0 refills | Status: DC

## 2017-07-05 MED ORDER — HYDROXYZINE HCL 25 MG PO TABS: 25.0000 mg | ORAL_TABLET | Freq: Three times a day (TID) | ORAL | 0 refills | Status: DC | PRN

## 2017-07-05 MED ORDER — OLANZAPINE 5 MG PO TABS: 5.0000 mg | ORAL_TABLET | Freq: Every day | ORAL | 0 refills | Status: DC

## 2017-07-05 MED ORDER — TRAZODONE HCL 50 MG PO TABS: ORAL_TABLET | ORAL | 0 refills | Status: DC

## 2017-07-05 NOTE — [facility] Suicide Risk Assessment (Signed)
[facility] Discharge Suicide Risk Assessment   Principal Problem: Bipolar disorder [facility]) Discharge Diagnoses:  Patient Active Problem List   Diagnosis Date Noted  . Bipolar disorder (HCC) [F31.9] 07/02/2017  . Suicidal overdose (HCC) [T50.902A] 07/01/2017  . Depression [F32.9]   . Tricyclic overdose, intentional self-harm, initial encounter (HCC) [T43.012A] 06/25/2017  . Acute respiratory failure (HCC) [J96.00] 06/25/2017  . Chronic hepatitis C without hepatic coma (HCC) [B18.2] 04/28/2017  . 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 Abdominal pain [R10.9] 07/01/2016  . Chronic headache [R51] 06/17/2016  . Bipolar and related disorder (HCC) [F31.9] 06/05/2015  . IUGR (intrauterine growth restriction) affecting care of mother [O36.5990] 08/14/2014  . Cigarette smoker [F17.210]   . MRSA (methicillin resistant staph aureus) culture positive [Z22.322] 05/26/2014  . Abnormal findings on antenatal screening [O28.9]   . Elevated AFP [R77.2]   . Choroid plexus cyst of fetus [O35.0XX0]   . Abnormal MSAFP (maternal serum alpha-fetoprotein), elevated [O28.0]   . High risk pregnancy with high inhibin [O28.8, O09.899]   . Choroid plexus cyst [G93.0]   . UTI (urinary tract infection) in pregnancy in second trimester [O23.42] 03/08/2014  . Marijuana use [F12.90] 03/08/2014  . Abnormal quad screen [O28.0] 03/08/2014  . Late prenatal care affecting pregnancy in second trimester, antepartum [O09.32] 03/04/2014  . Scoliosis [M41.9] 03/04/2014  . Anxiety disorder [F41.9] 09/05/2013  . Adjustment disorder with mixed emotional features [F43.29] 09/01/2013  . S/P spinal fusion [Z98.1] 08/29/2011    Total Time spent with patient: 30 minutes  Musculoskeletal: Strength & Muscle Tone: within normal limits Gait & Station: normal Patient leans: N/A  Psychiatric Specialty Exam: Review of Systems  Constitutional: Negative for chills and fever.  Respiratory:  Negative for cough and shortness of breath.   Cardiovascular: Negative for chest pain.  Gastrointestinal: Negative for abdominal pain, heartburn, nausea and vomiting.  Psychiatric/Behavioral: Negative for depression, hallucinations and suicidal ideas. The patient is not nervous/anxious.     Blood pressure (!) 88/64, pulse (!) 115, temperature 97.7 F (36.5 C), temperature source Oral, resp. rate 20, height 5\' 3"  (1.6 m), weight 34.9 kg (77 lb), last menstrual period 06/10/2017, SpO2 97 %, unknown if currently breastfeeding.Body mass index is 13.64 kg/m.  General Appearance: Casual and Fairly Groomed  Patent attorney::  Good  Speech:  Clear and Coherent and Normal Rate  Volume:  Normal  Mood:  Euthymic  Affect:  Appropriate and Congruent  Thought Process:  Coherent and Goal Directed  Orientation:  Full (Time, Place, and Person)  Thought Content:  Logical  Suicidal Thoughts:  No  Homicidal Thoughts:  No  Memory:  Immediate;   Fair Recent;   Fair Remote;   Fair  Judgement:  Fair  Insight:  Fair  Psychomotor Activity:  Normal  Concentration:  Good  Recall:  Good  Fund of Knowledge:Fair  Language: Fair  Akathisia:  No  Handed:    AIMS (if indicated):     Assets:  Resilience  Sleep:  Number of Hours: 6.75  Cognition: WNL  ADL's:  Intact   Mental Status Per Nursing Assessment::   On Admission:     Demographic Factors:  NA  Loss Factors: Financial problems/change in socioeconomic status  Historical Factors: Impulsivity  Risk Reduction Factors:   Living with another person, especially a relative, Positive social support, Positive therapeutic relationship and Positive coping skills or problem solving skills  Continued Clinical Symptoms:  Bipolar Disorder:   Depressive phase  Cognitive Features That Contribute  To Risk:  None    Suicide Risk:  Minimal: No identifiable suicidal ideation.  Patients presenting with no risk factors but with morbid ruminations; may be classified  as minimal risk based on the severity of the depressive symptoms  Follow-up Information    [facility] Follow up on 07/07/2017.   Why:  Follow up is scheduled for March 22nd, 2019 at 8am in the walkin clinic.  Please bring your ID, Social Security Card, proof of all family income, and any proof of insurance. Thank you. Contact information: [address] [place] [address] [phone]         Subjective Data:  Judy Graham is a 27 y/o F admitted on IVC after suicide attempt via overdose of TCA for which she required medical stabilization on the ICU unit. [facility] and continued on regimen started while at outside hospital. She has been reporting stability of her mood symptoms and good tolerability of her medications, as well as incremental improvement of her presenting mood symptoms.  Today upon evaluation, pt shares, "I'm great." She denies any specific concerns. She denies any physical complaints. She denies SI/HI/AH/VH. She is sleeping well. Her appetite is good. She is tolerating her medications without difficulty or side effects. She is in agreement to continue her current regimen without changes. [facility]. She was able to engage in safety planning including [facility] or contact emergency services if she feels unable to maintain her own safety or the safety of others. Pt had no further questions, comments, or concerns.    Plan Of Care/Follow-up recommendations:   -Discharge to outpatient level of care  - Bipolar I, current episode depressed             -Continue zyprexa 5mg  po qhs  - Anxiety             - Continue atarax 25mg  po TID prn anxiety  -Insomnia             -Continue trazodone 50mg  po qhs prn insomnia (may repeat x1)  -GERD             -Continue pepcid 20mg  po BID  - Scoliosis/chronic back pain             - Continue valium 5mg  po qDay prn severe anxiety and muscle/back  spasm             - Continue Norco 5-325mg  q6h prn moderate pain  -Migraine             - Continue fioricet 1 tablet q6h prn migraine  Activity:  as tolerated Diet:  normal Tests:  NA Other:  see above for DC plan  Judy Likenshristopher T Tion Tse, MD 07/05/2017, 9:51 AM

## 2017-07-05 NOTE — [facility] Suicide Risk Assessment (Signed)
[facility] INPATIENT:  Family/Significant Other Suicide Prevention Education  Suicide Prevention Education:  Education Completed; Micki RileyBlake Moran, [place], [phone]  has been identified by the patient as the family member/significant other with whom the patient will be residing, and identified as the person(s) who will aid the patient in the event of a mental health crisis (suicidal ideations/suicide attempt).  With written consent from the patient, the family member/significant other has been provided the following suicide prevention education, prior to the and/or following the discharge of the patient.  The suicide prevention education provided includes the following:  Suicide risk factors  Suicide prevention and interventions  National Suicide Hotline telephone number  [facility] assessment telephone number  [facility] Emergency Assistance 911  [facility] and/or Residential Mobile Crisis Unit telephone number  Request made of family/significant other to:  Remove weapons (e.g., guns, rifles, knives), all items previously/currently identified as safety concern.    Remove drugs/medications (over-the-counter, prescriptions, illicit drugs), all items previously/currently identified as a safety concern.  The family member/significant other verbalizes understanding of the suicide prevention education information provided.  The family member/significant other agrees to remove the items of safety concern listed above. Harrison MonsBlake states that he decided to separate with pt due to her unwillingness to take care of her mental health needs. That's when she OD'd.  He visited her here the other day, and believes that she is willing to change her behavior, and so he is willing to reunite.  There are no guns in the home.  Baldo DaubRodney B [facility] 07/05/2017, 11:16 AM

## 2017-07-05 NOTE — Plan of Care (Signed)
3.20.19 Patient attended and participated during Recreation Therapy group treatment successfully engaging in groups with a calm and appropriate mood at least 2x

## 2017-07-05 NOTE — Progress Notes (Signed)
  [facility] Discharge Plan :  Will you be returning to the same living situation after discharge:  Yes,  Home  At discharge, do you have transportation home?: Yes,  Husband  Do you have the ability to pay for your medications: Yes,  Insurance  Release of information consent forms completed and in the chart;  Patient's signature needed at discharge.  Patient to Follow up at: Follow-up Information    [facility] Follow up on 07/07/2017.   Why:  Follow up is scheduled for March 22nd, 2019 at 8am in the walkin clinic.  Please bring your ID, Social Security Card, proof of all family income, and any proof of insurance. Thank you. Contact information: [address] [place] [address] [phone]           Next level of care provider has access to [facility] Link:no  Safety Planning and Suicide Prevention discussed: Yes,  Yes  Have you used any form of tobacco in the last 30 days? (Cigarettes, Smokeless Tobacco, Cigars, and/or Pipes): Yes  Has patient been referred to the Quitline?: Patient refused referral  Patient has been referred for addiction treatment: Pt. refused referral  Aram BeechamAngel M Jolynne Spurgin, Student-Social Work 07/05/2017, 10:33 AM

## 2017-07-05 NOTE — Progress Notes (Addendum)
Recreation Therapy Notes  Date: 3.20.19 Time: 10:00 am Location: [address] Dayroom   Group Topic: Self Esteem   Goal Area(s) Addresses:  To increase self esteem: Patients will be able to identify five strengths that will improve self-esteem by the end of Recreation Therapy session.  To increase self-awareness: Patients will be able to identify five things that they like about themselves to increase self-awareness by the of Recreation Therapy session.   Behavioral Response: Engaged   Intervention: Craft   Activity: Brochure About Me: Patients created a brochure about themselves listing: best features, proudest moments, strengths, favorite activities, personal note, and things they like about themselves to increase self-esteem   Education: Self-Esteem, Discharge Planning   Education Outcome: Acknowledges education  Clinical Observations/Feedback: Patient attended and participated appropriately during recreation Therapy group treatment, successfully identifying five positive traits about themselves, five activities they enjoy doing, and a motivational message for themselves. Patient was able to recognize the importance of self-esteem and how it is beneficial to their overall health.   Sheryle Hailarian Edgel Degnan, Recreation Therapy Intern  Sheryle HailDarian Alcario Tinkey 07/05/2017 11:38 AM

## 2017-07-05 NOTE — Progress Notes (Signed)
CS[facility] [place] [facility]. Irving Burtonmily the APS worker has been trying to located the pt for follow up about an APS report made when [facility]. CS[facility].   Montine CircleKelsy Renad Jenniges, Silverio LayLCSWA [facility]  [phone]

## 2017-07-05 NOTE — Discharge Summary (Addendum)
Physician Discharge Summary Note  Patient:  Judy Graham is Graham 27 y.o., female  MRN:  [id number]  DOB:  December 22, 1990  Patient phone:  [phone] (home)   Patient address:   [address] [place] [address],   Total Time spent with patient: Greater than 30 minutes  Date of Admission:  07/02/2017  Date of Discharge: 07-05-17  Reason for Admission: Suicide attempt by overdose on Tricyclics & Buspar.  Principal Problem: Bipolar disorder [facility])  Discharge Diagnoses: Patient Active Problem List   Diagnosis Date Noted  . Bipolar disorder (HCC) [F31.9] 07/02/2017  . Suicidal overdose (HCC) [T50.902A] 07/01/2017  . Depression [F32.9]   . Tricyclic overdose, intentional self-harm, initial encounter (HCC) [T43.012A] 06/25/2017  . Acute respiratory failure (HCC) [J96.00] 06/25/2017  . Chronic hepatitis C without hepatic coma (HCC) [B18.2] 04/28/2017  . 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 Abdominal pain [R10.9] 07/01/2016  . Chronic headache [R51] 06/17/2016  . Bipolar and related disorder (HCC) [F31.9] 06/05/2015  . IUGR (intrauterine growth restriction) affecting care of mother [O36.5990] 08/14/2014  . Cigarette smoker [F17.210]   . MRSA (methicillin resistant staph aureus) culture positive [Z22.322] 05/26/2014  . Abnormal findings on antenatal screening [O28.9]   . Elevated AFP [R77.2]   . Choroid plexus cyst of fetus [O35.0XX0]   . Abnormal MSAFP (maternal serum alpha-fetoprotein), elevated [O28.0]   . High risk pregnancy with high inhibin [O28.8, O09.899]   . Choroid plexus cyst [G93.0]   . UTI (urinary tract infection) in pregnancy in second trimester [O23.42] 03/08/2014  . Marijuana use [F12.90] 03/08/2014  . Abnormal quad screen [O28.0] 03/08/2014  . Late prenatal care affecting pregnancy in second trimester, antepartum [O09.32] 03/04/2014  . Scoliosis [M41.9] 03/04/2014  . Anxiety disorder [F41.9] 09/05/2013  .  Adjustment disorder with mixed emotional features [F43.29] 09/01/2013  . S/P spinal fusion [Z98.1] 08/29/2011   Past Psychiatric History: See H&P  Past Medical History:  Past Medical History:  Diagnosis Date  . Scoliosis     Past Surgical History:  Procedure Laterality Date  . BACK SURGERY    . KNEE SURGERY     Family History:  Family History  Problem Relation Age of Onset  . Cancer Mother   . Cancer Maternal Grandmother   . Diabetes Paternal Grandmother   . Stroke Paternal Grandmother   . Hypertension Father    Family Psychiatric  History: See H&P Social History:  Social History   Substance and Sexual Activity  Alcohol Use Yes   Comment: occ     Social History   Substance and Sexual Activity  Drug Use No    Social History   Socioeconomic History  . Marital status: Single    Spouse name: None  . Number of children: None  . Years of education: None  . Highest education level: None  Social Needs  . Financial resource strain: None  . Food insecurity - worry: None  . Food insecurity - inability: None  . Transportation needs - medical: None  . Transportation needs - non-medical: None  Occupational History  . None  Tobacco Use  . Smoking status: Current Every Day Smoker    Packs/day: 0.50    Types: Cigarettes  . Smokeless tobacco: Never Used  Substance and Sexual Activity  . Alcohol use: Yes    Comment: occ  . Drug use: No  . Sexual activity: Yes    Birth control/protection: None  Other Topics Concern  . None  Social  History Narrative  . None   Hospital Course: (Per Md's SRA): Judy Graham is a 27 y/o F admitted on IVC after suicide attempt via overdose of TCA for which she required medical stabilization on the ICU unit. Pt was transferred to [facility] and continued on regimen started while at outside hospital. She has been reporting stability of her mood symptoms and good tolerability of her medications, as well as incremental improvement of her presenting  mood symptoms.  During the course of her hospitalization, Judy Graham was medicated & discharged on; Diazepam 5 mg for severe anxiety, Hydroxyzine 25 mg prn for anxiety, Olanzapine 5 mg for mood control & Trazodone 50 mg Q hs for insomnia. She also was resumed on all pertinent home medications for her other pre-existing medical issues presented. She tolerated her treatment regimen without any adverse effects or reactions reported. Judy Graham was also enrolled & participated in the group counseling sessions being offered & held on this unit. She learned coping skills.   Today upon her discharge evaluation, pt shares, "I'm great." She denies any specific concerns. She denies any physical complaints. She denies SI/HI/AH/VH. She is sleeping well. Her appetite is good. She is tolerating her medications without difficulty or side effects. She is in agreement to continue her current regimen without changes. She plans to follow up at [facility] after discharge. She was able to engage in safety planning including plan to return to [facility] or contact emergency services if she feels unable to maintain her own safety.  Upon discharge, Judy Graham was both mentally medically stable. She will continue mental health care on Graham outpatient basis as noted below. She was provided with all the necessary information needed to make this appointment without problems. She left [facility] with all personal belongings in no apparent distress. Transportation per her arrangement.  Physical Findings: AIMS: Facial and Oral Movements Muscles of Facial Expression: None, normal Lips and Perioral Area: None, normal Jaw: None, normal Tongue: None, normal,Extremity Movements Upper (arms, wrists, hands, fingers): None, normal Lower (legs, knees, ankles, toes): None, normal, Trunk Movements Neck, shoulders, hips: None, normal, Overall Severity Severity of abnormal movements (highest score from questions above): None, normal Incapacitation due to abnormal  movements: None, normal Patient's awareness of abnormal movements (rate only patient's report): No Awareness, Dental Status Current problems with teeth and/or dentures?: No Does patient usually wear dentures?: No  CIWA:    COWS:     Musculoskeletal: Strength & Muscle Tone: within normal limits Gait & Station: normal Patient leans: N/A  Psychiatric Specialty Exam: Physical Exam  Constitutional: She appears well-developed.  HENT:  Head: Normocephalic.  Eyes: Pupils are equal, round, and reactive to light.  Neck: Normal range of motion.  Cardiovascular: Normal rate.  Respiratory: Effort normal.  GI: Soft.  Genitourinary:  Genitourinary Comments: Deferred  Musculoskeletal: Normal range of motion.  Neurological: She is alert.  Skin: Skin is warm.    Review of Systems  Constitutional: Negative.   HENT: Negative.   Eyes: Negative.   Respiratory: Negative.   Cardiovascular: Negative.   Gastrointestinal: Negative.   Genitourinary: Negative.   Musculoskeletal: Negative.   Skin: Negative.     Blood pressure (!) 88/64, pulse (!) 115, temperature 97.7 F (36.5 C), temperature source Oral, resp. rate 20, height 5\' 3"  (1.6 m), weight 34.9 kg (77 lb), last menstrual period 06/10/2017, SpO2 97 %, unknown if currently breastfeeding.Body mass index is 13.64 kg/m.  See Md's SRA   Have you used any form of tobacco in the last 30  days? (Cigarettes, Smokeless Tobacco, Cigars, and/or Pipes): Yes  Has this patient used any form of tobacco in the last 30 days? (Cigarettes, Smokeless Tobacco, Cigars, and/or Pipes): N/A  Blood Alcohol level:  Lab Results  Component Value Date   ETH <10 06/25/2017   ETH <10 06/24/2017   Metabolic Disorder Labs:  No results found for: HGBA1C, MPG No results found for: PROLACTIN Lab Results  Component Value Date   TRIG 77 06/26/2017   See Psychiatric Specialty Exam and Suicide Risk Assessment completed by Attending Physician prior to  discharge.  Discharge destination:  Home  Is patient on multiple antipsychotic therapies at discharge:  No   Has Patient had three or more failed trials of antipsychotic monotherapy by history:  No  Recommended Plan for Multiple Antipsychotic Therapies: NA  Allergies as of 07/05/2017      Reactions   Clindamycin/lincomycin Anaphylaxis, Swelling   Gabapentin Shortness Of Breath, Swelling   Flexeril [cyclobenzaprine]    Restless leg syndrome, numbness   Gluten Meal       Medication List    STOP taking these medications   amoxicillin-clavulanate 875-125 MG tablet Commonly known as:  AUGMENTIN     TAKE these medications     Indication  diazepam 5 MG tablet Commonly known as:  VALIUM Take 1 tablet (5 mg total) by mouth daily as needed for anxiety.  Indication:  Feeling Anxious   famotidine 20 MG tablet Commonly known as:  PEPCID Take 1 tablet (20 mg total) by mouth 2 (two) times daily. For GERD  Indication:  Gastroesophageal Reflux Disease   HYDROcodone-acetaminophen 5-325 MG tablet Commonly known as:  NORCO/VICODIN Take 1 tablet by mouth every 6 (six) hours as needed for moderate pain.  Indication:  Pain   hydrOXYzine 25 MG tablet Commonly known as:  ATARAX/VISTARIL Take 1 tablet (25 mg total) by mouth 3 (three) times daily as needed (mild/moderate anxiety).  Indication:  Feeling Anxious   OLANZapine 5 MG tablet Commonly known as:  ZYPREXA Take 1 tablet (5 mg total) by mouth at bedtime. For mood control What changed:  additional instructions  Indication:  Mood control   traZODone 50 MG tablet Commonly known as:  DESYREL Take 1 tablet (50 mg) by mouth at bedtime: For sleep  Indication:  Trouble Sleeping      Follow-up Information    Services, [facility] Follow up on 07/07/2017.   Why:  Follow up is scheduled for March 22nd, 2019 at 8am in the walkin clinic.  Please bring your ID, Social Security Card, proof of all family income, and any proof of  insurance. Thank you. Contact information: [address] [place] [address] [phone]          Follow-up recommendations: Activity:  As tolerated Diet: As recommended by your primary care doctor. Keep all scheduled follow-up appointments as recommended.  Comments: Patient is instructed prior to discharge to: Take all medications as prescribed by his/her mental healthcare provider. Report any adverse effects and or reactions from the medicines to his/her outpatient provider promptly. Patient has been instructed & cautioned: To not engage in alcohol and or illegal drug use while on prescription medicines. In the event of worsening symptoms, patient is instructed to call the crisis hotline, 911 and or go to the nearest ED for appropriate evaluation and treatment of symptoms. To follow-up with his/her primary care provider for your other medical issues, concerns and or health care needs.   Signed: Armandina Stammer, NP, PMHNP, FNP-BC 07/05/2017, 10:44  AM   Patient seen, Suicide Assessment Completed.  Disposition Plan Reviewed

## 2017-07-05 NOTE — Progress Notes (Signed)
Recreation Therapy Notes  INPATIENT RECREATION TR PLAN  Patient Details Name: Judy Graham MRN: [id number] DOB: [birth year] Today's Date: 07/05/2017  Rec Therapy Plan Is patient appropriate for Therapeutic Recreation?: Yes Treatment times per week: At least three  Estimated Length of Stay: 5-7 days  TR Treatment/Interventions: Group participation (Appropriate participation in Recreation Therapy group tx.)  Discharge Criteria Pt will be discharged from therapy if:: Discharged Treatment plan/goals/alternatives discussed and agreed upon by:: Patient/family  Discharge Summary Short term goals set: See care plan  Short term goals met: Complete Progress toward goals comments: Groups attended Which groups?: Self-esteem, Coping skills, Self-Expression Therapeutic equipment acquired: None  Reason patient discharged from therapy: Discharge from hospital Pt/family agrees with progress & goals achieved: Yes Date patient discharged from therapy: 07/05/17  Ranell Patrick, Recreation Therapy Intern   Ranell Patrick 07/05/2017, 12:14 PM

## 2017-07-12 ENCOUNTER — Inpatient Hospital Stay (HOSPITAL_COMMUNITY)
Admission: RE | Admit: 2017-07-12 | Discharge: 2017-07-17 | DRG: 885 | Disposition: A | Discharge status: Home / Self Care | Payer: Medicaid Other | Attending: Psychiatry | Admitting: Psychiatry

## 2017-07-12 ENCOUNTER — Other Ambulatory Visit: Payer: Self-pay

## 2017-07-12 ENCOUNTER — Encounter (HOSPITAL_COMMUNITY): Payer: Self-pay | Admitting: *Deleted

## 2017-07-12 DIAGNOSIS — Z881 Allergy status to other antibiotic agents status: Secondary | ICD-10-CM

## 2017-07-12 DIAGNOSIS — F3130 Bipolar disorder, current episode depressed, mild or moderate severity, unspecified: Secondary | ICD-10-CM | POA: Diagnosis present

## 2017-07-12 DIAGNOSIS — Z681 Body mass index (BMI) 19 or less, adult: Secondary | ICD-10-CM

## 2017-07-12 DIAGNOSIS — R45851 Suicidal ideations: Secondary | ICD-10-CM | POA: Diagnosis not present

## 2017-07-12 DIAGNOSIS — R402413 Glasgow coma scale score 13-15, at hospital admission: Secondary | ICD-10-CM | POA: Diagnosis present

## 2017-07-12 DIAGNOSIS — Z981 Arthrodesis status: Secondary | ICD-10-CM

## 2017-07-12 DIAGNOSIS — M549 Dorsalgia, unspecified: Secondary | ICD-10-CM | POA: Diagnosis present

## 2017-07-12 DIAGNOSIS — R638 Other symptoms and signs concerning food and fluid intake: Secondary | ICD-10-CM | POA: Diagnosis present

## 2017-07-12 DIAGNOSIS — Z8669 Personal history of other diseases of the nervous system and sense organs: Secondary | ICD-10-CM

## 2017-07-12 DIAGNOSIS — R440 Auditory hallucinations: Secondary | ICD-10-CM | POA: Diagnosis not present

## 2017-07-12 DIAGNOSIS — F4310 Post-traumatic stress disorder, unspecified: Secondary | ICD-10-CM | POA: Diagnosis present

## 2017-07-12 DIAGNOSIS — Z23 Encounter for immunization: Secondary | ICD-10-CM

## 2017-07-12 DIAGNOSIS — F419 Anxiety disorder, unspecified: Secondary | ICD-10-CM | POA: Diagnosis present

## 2017-07-12 DIAGNOSIS — Z79899 Other long term (current) drug therapy: Secondary | ICD-10-CM | POA: Diagnosis not present

## 2017-07-12 DIAGNOSIS — D649 Anemia, unspecified: Secondary | ICD-10-CM | POA: Diagnosis present

## 2017-07-12 DIAGNOSIS — M419 Scoliosis, unspecified: Secondary | ICD-10-CM | POA: Diagnosis present

## 2017-07-12 DIAGNOSIS — G43909 Migraine, unspecified, not intractable, without status migrainosus: Secondary | ICD-10-CM | POA: Diagnosis present

## 2017-07-12 DIAGNOSIS — Z630 Problems in relationship with spouse or partner: Secondary | ICD-10-CM | POA: Diagnosis not present

## 2017-07-12 DIAGNOSIS — B182 Chronic viral hepatitis C: Secondary | ICD-10-CM | POA: Diagnosis present

## 2017-07-12 DIAGNOSIS — F17210 Nicotine dependence, cigarettes, uncomplicated: Secondary | ICD-10-CM | POA: Diagnosis present

## 2017-07-12 DIAGNOSIS — Z888 Allergy status to other drugs, medicaments and biological substances status: Secondary | ICD-10-CM | POA: Diagnosis not present

## 2017-07-12 DIAGNOSIS — G8929 Other chronic pain: Secondary | ICD-10-CM | POA: Diagnosis present

## 2017-07-12 DIAGNOSIS — R110 Nausea: Secondary | ICD-10-CM | POA: Diagnosis not present

## 2017-07-12 DIAGNOSIS — G4700 Insomnia, unspecified: Secondary | ICD-10-CM | POA: Diagnosis present

## 2017-07-12 DIAGNOSIS — R636 Underweight: Secondary | ICD-10-CM | POA: Diagnosis present

## 2017-07-12 DIAGNOSIS — K219 Gastro-esophageal reflux disease without esophagitis: Secondary | ICD-10-CM | POA: Diagnosis present

## 2017-07-12 DIAGNOSIS — Z915 Personal history of self-harm: Secondary | ICD-10-CM

## 2017-07-12 DIAGNOSIS — R450 Nervousness: Secondary | ICD-10-CM | POA: Diagnosis not present

## 2017-07-12 HISTORY — DX: Unspecified convulsions: R56.9

## 2017-07-12 HISTORY — DX: Headache: R51

## 2017-07-12 HISTORY — DX: Headache, unspecified: R51.9

## 2017-07-12 MED ORDER — ENSURE ENLIVE PO LIQD
237.0000 mL | Freq: Two times a day (BID) | ORAL | Status: DC
  Administered 2017-07-13 – 2017-07-17 (×9): 237 mL via ORAL

## 2017-07-12 MED ORDER — MAGNESIUM HYDROXIDE 400 MG/5ML PO SUSP: 30.0000 mL | Freq: Every day | ORAL | Status: DC | PRN

## 2017-07-12 MED ORDER — IBUPROFEN 600 MG PO TABS
600.0000 mg | ORAL_TABLET | Freq: Four times a day (QID) | ORAL | Status: DC | PRN
  Administered 2017-07-12 – 2017-07-17 (×3): 600 mg via ORAL
  Filled 2017-07-12 (×3): qty 1

## 2017-07-12 MED ORDER — ACETAMINOPHEN 325 MG PO TABS: 650.0000 mg | ORAL_TABLET | Freq: Four times a day (QID) | ORAL | Status: DC | PRN

## 2017-07-12 MED ORDER — HYDROCODONE-ACETAMINOPHEN 5-325 MG PO TABS
1.0000 | ORAL_TABLET | Freq: Four times a day (QID) | ORAL | Status: DC | PRN
  Administered 2017-07-12 – 2017-07-17 (×14): 1 via ORAL
  Filled 2017-07-12 (×14): qty 1

## 2017-07-12 MED ORDER — ALUM & MAG HYDROXIDE-SIMETH 200-200-20 MG/5ML PO SUSP: 30.0000 mL | ORAL | Status: DC | PRN

## 2017-07-12 MED ORDER — NICOTINE POLACRILEX 2 MG MT GUM
2.0000 mg | CHEWING_GUM | OROMUCOSAL | Status: DC | PRN
  Administered 2017-07-14 – 2017-07-16 (×2): 2 mg via ORAL
  Filled 2017-07-12 (×2): qty 1

## 2017-07-12 MED ORDER — FAMOTIDINE 20 MG PO TABS
20.0000 mg | ORAL_TABLET | Freq: Two times a day (BID) | ORAL | Status: DC
  Administered 2017-07-12 – 2017-07-17 (×10): 20 mg via ORAL
  Filled 2017-07-12 (×2): qty 1
  Filled 2017-07-12 (×3): qty 14
  Filled 2017-07-12: qty 1
  Filled 2017-07-12: qty 14
  Filled 2017-07-12 (×3): qty 1
  Filled 2017-07-12 (×4): qty 14
  Filled 2017-07-12: qty 1

## 2017-07-12 MED ORDER — OLANZAPINE 5 MG PO TABS
5.0000 mg | ORAL_TABLET | Freq: Every day | ORAL | Status: DC
  Administered 2017-07-12: 5 mg via ORAL
  Filled 2017-07-12: qty 1
  Filled 2017-07-12: qty 2
  Filled 2017-07-12: qty 1

## 2017-07-12 MED ORDER — TRAZODONE HCL 50 MG PO TABS
50.0000 mg | ORAL_TABLET | Freq: Every evening | ORAL | Status: DC | PRN
  Administered 2017-07-12 – 2017-07-16 (×3): 50 mg via ORAL
  Filled 2017-07-12: qty 7
  Filled 2017-07-12 (×2): qty 1
  Filled 2017-07-12: qty 7
  Filled 2017-07-12 (×2): qty 1

## 2017-07-12 MED ORDER — HYDROXYZINE HCL 25 MG PO TABS
25.0000 mg | ORAL_TABLET | Freq: Three times a day (TID) | ORAL | Status: DC | PRN
  Administered 2017-07-12 – 2017-07-13 (×3): 25 mg via ORAL
  Filled 2017-07-12 (×3): qty 1

## 2017-07-12 NOTE — Progress Notes (Signed)
D: Pt visible on the unit. Pt focused on her "pain medication, anxiety "  A: Pt was offered support and encouragement. Pt was given scheduled medications. Pt was encourage to attend groups. Q 15 minute checks were done for safety.   R:Pt attends groups and interacts well with peers and staff. Pt is taking medication. Pt receptive to treatment and safety maintained on unit.

## 2017-07-12 NOTE — H&P (Signed)
Behavioral Health Medical Screening Exam  Judy Graham is an 27 y.o. female [facility] with complaints of worsening depression, decreased appetite, and suicidal ideation with multiple plans.  Patient is unable to contract for safety.  Total Time spent with patient: 30 minutes  Psychiatric Specialty Exam: Physical Exam  Nursing note and vitals reviewed. Constitutional: She is oriented to person, place, and time.  Neck: Normal range of motion. Neck supple.  Respiratory: Effort normal.  Musculoskeletal: Normal range of motion.  Neurological: She is alert and oriented to person, place, and time.  Skin: Skin is warm and dry.    Review of Systems  Psychiatric/Behavioral: Positive for depression and suicidal ideas. Negative for hallucinations and memory loss. The patient is nervous/anxious.     Blood pressure 121/81, pulse (!) 107, temperature 98.7 F (37.1 C), resp. rate 18, SpO2 100 %, unknown if currently breastfeeding.There is no height or weight on file to calculate BMI.  General Appearance: Casual  Eye Contact:  Good  Speech:  Clear and Coherent and Normal Rate  Volume:  Normal  Mood:  Depressed  Affect:  Depressed and Flat  Thought Process:  Goal Directed  Orientation:  Full (Time, Place, and Person)  Thought Content:  Denies hallucinations, delusions, and paranoia  Suicidal Thoughts:  No  Homicidal Thoughts:  No  Memory:  Immediate;   Good Recent;   Good Remote;   Good  Judgement:  Impaired  Insight:  Fair  Psychomotor Activity:  Normal  Concentration: Concentration: Fair and Attention Span: Fair  Recall:  Good  Fund of Knowledge:Fair  Language: Good  Akathisia:  No  Handed:  Right  AIMS (if indicated):     Assets:  Communication Skills Desire for Improvement Social Support  Sleep:       Musculoskeletal: Strength & Muscle Tone: within normal limits Gait & Station: normal Patient leans: N/A  Blood pressure 121/81, pulse (!) 107,  temperature 98.7 F (37.1 C), resp. rate 18, SpO2 100 %, unknown if currently breastfeeding.  Recommendations: Inpatient psychiatric treatment  Based on my evaluation the patient does not appear to have an emergency medical condition.  Shuvon Rankin, NP 07/12/2017, 6:07 PM

## 2017-07-12 NOTE — Tx Team (Signed)
Initial Treatment Plan 07/12/2017 7:24 PM Judy Graham Gettinger [id number]    PATIENT STRESSORS: Marital or family conflict   PATIENT STRENGTHS: Ability for insight Average or above average intelligence Capable of independent living General fund of knowledge Motivation for treatment/growth   PATIENT IDENTIFIED PROBLEMS: Depression Suicidal thoughts "work on Pharmacologistcoping skills and my mental health"                     DISCHARGE CRITERIA:  Ability to meet basic life and health needs Improved stabilization in mood, thinking, and/or behavior Reduction of life-threatening or endangering symptoms to within safe limits Verbal commitment to aftercare and medication compliance  PRELIMINARY DISCHARGE PLAN: Attend aftercare/continuing care group  PATIENT/FAMILY INVOLVEMENT: This treatment plan has been presented to and reviewed with the patient, Judy Graham Paletta, and/or family member, .  The patient and family have been given the opportunity to ask questions and make suggestions.  Ellianah Cordy, [place], [place] 07/12/2017, 7:24 PM
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
## 2017-07-12 NOTE — Progress Notes (Signed)
The patient would not share with the group this evening. Her goal for tomorrow is to work on her coping skills.

## 2017-07-12 NOTE — Progress Notes (Signed)
Judy Graham is a 27 year old female pt admitted on voluntary basis after presenting as a walk-in. She spoke about how she she has recently discovered that her husband is cheating on her with her best friend and reports that she has been feeling suicidal because of it. She does endorse passive SI on admission but is able to contract for safety while in the hospital. She denies any substance abuse issues and reports that she has been taking all of her medications as prescribed. She reports that she was living with her husband but reports she is unsure where she will go once she is discharged as she reports that she does not want to go back with him and she does not have any family that she can move in with. Judy Graham was oriented to the unit and safety maintained.

## 2017-07-13 DIAGNOSIS — R110 Nausea: Secondary | ICD-10-CM

## 2017-07-13 DIAGNOSIS — F419 Anxiety disorder, unspecified: Secondary | ICD-10-CM

## 2017-07-13 DIAGNOSIS — F17210 Nicotine dependence, cigarettes, uncomplicated: Secondary | ICD-10-CM

## 2017-07-13 DIAGNOSIS — F3130 Bipolar disorder, current episode depressed, mild or moderate severity, unspecified: Principal | ICD-10-CM

## 2017-07-13 DIAGNOSIS — R440 Auditory hallucinations: Secondary | ICD-10-CM

## 2017-07-13 DIAGNOSIS — R45851 Suicidal ideations: Secondary | ICD-10-CM

## 2017-07-13 DIAGNOSIS — M549 Dorsalgia, unspecified: Secondary | ICD-10-CM

## 2017-07-13 LAB — CBC
HEMATOCRIT: 32.9 % — AB (ref 36.0–46.0)
HEMOGLOBIN: 10.2 g/dL — AB (ref 12.0–15.0)
MCH: 26.5 pg (ref 26.0–34.0)
MCHC: 31 g/dL (ref 30.0–36.0)
MCV: 85.5 fL (ref 78.0–100.0)
Platelets: 341 10*3/uL (ref 150–400)
RBC: 3.85 MIL/uL — AB (ref 3.87–5.11)
RDW: 16.9 % — ABNORMAL HIGH (ref 11.5–15.5)
WBC: 4.9 10*3/uL (ref 4.0–10.5)

## 2017-07-13 LAB — COMPREHENSIVE METABOLIC PANEL
ALBUMIN: 3.9 g/dL (ref 3.5–5.0)
ALK PHOS: 74 U/L (ref 38–126)
ALT: 56 U/L — AB (ref 14–54)
ANION GAP: 7 (ref 5–15)
AST: 49 U/L — ABNORMAL HIGH (ref 15–41)
BILIRUBIN TOTAL: 0.4 mg/dL (ref 0.3–1.2)
BUN: 15 mg/dL (ref 6–20)
CO2: 26 mmol/L (ref 22–32)
Calcium: 9.3 mg/dL (ref 8.9–10.3)
Chloride: 110 mmol/L (ref 101–111)
Creatinine, Ser: 0.47 mg/dL (ref 0.44–1.00)
GFR calc Af Amer: >60 mL/min (ref >60)
GFR calc non Af Amer: >60 mL/min (ref >60)
GLUCOSE: 88 mg/dL (ref 65–99)
Potassium: 3.7 mmol/L (ref 3.5–5.1)
Sodium: 143 mmol/L (ref 135–145)
TOTAL PROTEIN: 7.5 g/dL (ref 6.5–8.1)

## 2017-07-13 LAB — LIPID PANEL
CHOL/HDL RATIO: 2.3 ratio
Cholesterol: 167 mg/dL (ref 0–200)
HDL: 73 mg/dL (ref >40)
LDL Cholesterol: 82 mg/dL (ref 0–99)
Triglycerides: 59 mg/dL (ref <150)
VLDL: 12 mg/dL (ref 0–40)

## 2017-07-13 LAB — ETHANOL

## 2017-07-13 LAB — HEMOGLOBIN A1C
Hgb A1c MFr Bld: 5.1 % (ref 4.8–5.6)
Mean Plasma Glucose: 99.67 mg/dL

## 2017-07-13 LAB — TSH: TSH: 4.95 u[IU]/mL — ABNORMAL HIGH (ref 0.350–4.500)

## 2017-07-13 MED ORDER — MIRTAZAPINE 15 MG PO TABS
15.0000 mg | ORAL_TABLET | Freq: Every day | ORAL | Status: DC
  Administered 2017-07-13 – 2017-07-16 (×4): 15 mg via ORAL
  Filled 2017-07-13 (×2): qty 7
  Filled 2017-07-13 (×2): qty 1
  Filled 2017-07-13 (×2): qty 7

## 2017-07-13 MED ORDER — OLANZAPINE 10 MG PO TABS
10.0000 mg | ORAL_TABLET | Freq: Every day | ORAL | Status: DC
  Administered 2017-07-13 – 2017-07-16 (×4): 10 mg via ORAL
  Filled 2017-07-13: qty 1
  Filled 2017-07-13: qty 7
  Filled 2017-07-13: qty 1
  Filled 2017-07-13 (×3): qty 7

## 2017-07-13 MED ORDER — ADULT MULTIVITAMIN W/MINERALS CH
1.0000 | ORAL_TABLET | Freq: Every day | ORAL | Status: DC
  Administered 2017-07-13: 1 via ORAL
  Filled 2017-07-13 (×7): qty 1

## 2017-07-13 MED ORDER — FERROUS SULFATE 325 (65 FE) MG PO TABS
325.0000 mg | ORAL_TABLET | Freq: Every day | ORAL | Status: DC
  Administered 2017-07-13 – 2017-07-17 (×5): 325 mg via ORAL
  Filled 2017-07-13: qty 7
  Filled 2017-07-13 (×3): qty 1
  Filled 2017-07-13 (×3): qty 7

## 2017-07-13 MED ORDER — ONDANSETRON HCL 4 MG PO TABS
4.0000 mg | ORAL_TABLET | Freq: Three times a day (TID) | ORAL | Status: DC | PRN
  Administered 2017-07-13 – 2017-07-14 (×2): 4 mg via ORAL
  Filled 2017-07-13 (×2): qty 1

## 2017-07-13 MED ORDER — HYDROXYZINE HCL 50 MG PO TABS
50.0000 mg | ORAL_TABLET | Freq: Four times a day (QID) | ORAL | Status: DC | PRN
  Administered 2017-07-13 – 2017-07-16 (×5): 50 mg via ORAL
  Filled 2017-07-13 (×3): qty 1
  Filled 2017-07-13: qty 10
  Filled 2017-07-13 (×2): qty 1

## 2017-07-13 NOTE — Progress Notes (Signed)
NUTRITION ASSESSMENT  Pt identified as at risk on the Malnutrition Screen Tool  INTERVENTION: 1. Educated patient on the importance of nutrition and encouraged intake of food and beverages. 2. Discussed weight goals. 3. Supplements:  - continue Ensure Enlive BID, each supplement provides 350 kcal and 20 grams of protein - will order daily multivitamin with minerals.   NUTRITION DIAGNOSIS: Unintentional weight loss related to sub-optimal intake as evidenced by pt report.   Goal: Pt to meet >/= 90% of their estimated nutrition needs.  Monitor:  PO intake  Assessment:  Pt admitted for SI. Per chart review, she weighed 100 lbs at [facility] on 06/20/17 and [facility]. From weight hx outlined below, it appears that pt's weight has fluctuated several times over the past few years.   Ensure Enlive was ordered BID this AM. Continue this supplement and continue to encourage PO intakes of meals, supplements, and snacks.      27 y.o. female  Height: Ht Readings from Last 1 Encounters:  07/12/17 5\' 3"  (1.6 m)    Weight: Wt Readings from Last 1 Encounters:  07/12/17 80 lb (36.3 kg)    Weight Hx: Wt Readings from Last 10 Encounters:  07/12/17 80 lb (36.3 kg)  07/02/17 77 lb (34.9 kg)  07/01/17 77 lb 1.6 oz (35 kg)  06/24/17 100 lb (45.4 kg)  04/30/17 80 lb (36.3 kg)  04/17/15 100 lb (45.4 kg)  08/14/14 119 lb (54 kg)  08/14/14 119 lb (54 kg)  07/21/14 114 lb 14.4 oz (52.1 kg)  06/25/14 110 lb 8 oz (50.1 kg)    BMI:  Body mass index is 14.17 kg/m. Pt meets criteria for underweight based on current BMI.  Estimated Nutritional Needs: Kcal: 25-30 kcal/kg Protein: > 1 gram protein/kg Fluid: 1 ml/kcal  Diet Order: Diet regular Room service appropriate? Yes; Fluid consistency: Thin Pt is also offered choice of unit snacks mid-morning and mid-afternoon.  Pt is eating as desired.   Lab results and medications reviewed.      Trenton GammonJessica Kadyn Chovan, MS, RD, LDN,  [facility] Inpatient Clinical Dietitian Pager # [phone] After hours/weekend pager # [phone]

## 2017-07-13 NOTE — H&P (Signed)
Psychiatric Admission Assessment Adult  Patient Identification: Judy Graham MRN:  [id number] Date of Evaluation:  07/13/2017 Chief Complaint:  depression Principal Diagnosis: Bipolar I disorder, most recent episode (or current) depressed ([place]) Diagnosis:   Patient Active Problem List   Diagnosis Date Noted  . Bipolar I disorder, most recent episode (or current) depressed ([place]) [F31.30] 07/12/2017  . Bipolar disorder ([place]) [F31.9] 07/02/2017  . Suicidal overdose ([place]) [T50.902A] 07/01/2017  . Depression [F32.9]   . Tricyclic overdose, intentional self-harm, initial encounter ([place]) [T43.012A] 06/25/2017  . Acute respiratory failure ([place]) [J96.00] 06/25/2017  . Chronic hepatitis C without hepatic coma ([place]) [B18.2] 04/28/2017  . 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 Abdominal pain [R10.9] 07/01/2016  . Chronic headache [R51] 06/17/2016  . Bipolar and related disorder ([place]) [F31.9] 06/05/2015  . IUGR (intrauterine growth restriction) affecting care of mother [O36.5990] 08/14/2014  . Cigarette smoker [F17.210]   . MRSA (methicillin resistant staph aureus) culture positive [Z22.322] 05/26/2014  . Abnormal findings on antenatal screening [O28.9]   . Elevated AFP [R77.2]   . Choroid plexus cyst of fetus [O35.3ZC5]   . Abnormal MSAFP (maternal serum alpha-fetoprotein), elevated [O28.0]   . High risk pregnancy with high inhibin [O28.8, O09.899]   . Choroid plexus cyst [G93.0]   . UTI (urinary tract infection) in pregnancy in second trimester [O23.42] 03/08/2014  . Marijuana use [F12.90] 03/08/2014  . Abnormal quad screen [O28.0] 03/08/2014  . Late prenatal care affecting pregnancy in second trimester, antepartum [O09.32] 03/04/2014  . Scoliosis [M41.9] 03/04/2014  . Anxiety disorder [F41.9] 09/05/2013  . Adjustment disorder with mixed emotional features [F43.29] 09/01/2013  . S/P spinal fusion [Z98.1] 08/29/2011   History of Present  Illness:   Judy Graham is a 27 y/o F with history of Bipolar I who was admitted voluntarily with worsening symptoms of depression and SI with multiple plans in the context of discovering that her husband has been cheating on her. Pt has recent relevant history of admission to [facility] with discharge on 07/05/17. She reports that she has been adherent to her outpatient medication regimen.  Upon initial evaluation, pt shares, "I walked in. I caught my husband sleeping with my best friend and I started having flashbacks. I had suicidal thoughts." Pt shares that she thought about multiple plans, but she did not have specific intent to follow through, and she knew to bring herself to [facility] as part of her safety plan. She denies HI. She reports AH of "static." She denies VH. She reports depressive symptoms of depressed mood, anhedonia, guilty feelings, low energy, poor concentration, and poor appetite. She is significantly underweight, and she endorses restricting herself from food but she denies purging behaviors. She denies negative self image and instead endorses poor appetite. She denies symptoms of mania, OCD, and PTSD. She denies recent illicit substance use.   Discussed with patient about treatment options. She notes that her medications have been helpful and she would like to resume olanzapine, but at a higher dose. We discussed trial of remeron to address symptoms of depression and poor appetite and pt was in agreement. She had no further questions, comments, or concerns.   Associated Signs/Symptoms: Depression Symptoms:  depressed mood, anhedonia, fatigue, feelings of worthlessness/guilt, difficulty concentrating, suicidal thoughts with specific plan, anxiety, weight loss, decreased appetite, (Hypo) Manic Symptoms:  Distractibility, Anxiety Symptoms:  Excessive Worry, Psychotic Symptoms:  Hallucinations: Auditory PTSD Symptoms: Re-experiencing:  Flashbacks Total Time spent with patient: 1  hour  Past  Psychiatric History:   - dx of Bipolar disorder - last inpatient admission was 07/03/17 to [facility] - has not followed up with outpatient provider  Is the patient at risk to self? Yes.    Has the patient been a risk to self in the past 6 months? Yes.    Has the patient been a risk to self within the distant past? Yes.    Is the patient a risk to others? Yes.    Has the patient been a risk to others in the past 6 months? Yes.    Has the patient been a risk to others within the distant past? Yes.     Prior Inpatient Therapy: Prior Inpatient Therapy: Yes Prior Therapy Dates: 2007 , 2019 Prior Therapy Facilty/Provider(s): [place] , [facility] Reason for Treatment: cutting, mental health, SI thoughts  Prior Outpatient Therapy: Prior Outpatient Therapy: Yes Prior Therapy Dates: 2007  Prior Therapy Facilty/Provider(s): "place in [place], [place]" Reason for Treatment: depression Does patient have an ACCT team?: No Does patient have Intensive In-House Services?  : No Does patient have [facility] services? : No Does patient have P4CC services?: No  Alcohol Screening: 1. How often do you have a drink containing alcohol?: Monthly or less 2. How many drinks containing alcohol do you have on a typical day when you are drinking?: 3 or 4 3. How often do you have six or more drinks on one occasion?: Never AUDIT-C Score: 2 4. How often during the last year have you found that you were not able to stop drinking once you had started?: Never 5. How often during the last year have you failed to do what was normally expected from you becasue of drinking?: Never 6. How often during the last year have you needed a first drink in the morning to get yourself going after a heavy drinking session?: Never 7. How often during the last year have you had a feeling of guilt of remorse after drinking?: Never 8. How often during the last year have you been unable to remember what happened the night before because you had  been drinking?: Never 9. Have you or someone else been injured as a result of your drinking?: No 10. Has a relative or friend or a doctor or another health worker been concerned about your drinking or suggested you cut down?: No Alcohol Use Disorder Identification Test Final Score (AUDIT): 2 Intervention/Follow-up: AUDIT Score <7 follow-up not indicated Substance Abuse History in the last 12 months:  Yes.   Consequences of Substance Abuse: NA Previous Psychotropic Medications: Yes  Psychological Evaluations: Yes  Past Medical History:  Past Medical History:  Diagnosis Date  . Headache   . Scoliosis   . Seizures ([place])     Past Surgical History:  Procedure Laterality Date  . BACK SURGERY    . KNEE SURGERY     Family History:  Family History  Problem Relation Age of Onset  . Cancer Mother   . Cancer Maternal Grandmother   . Diabetes Paternal Grandmother   . Stroke Paternal Grandmother   . Hypertension Father    Family Psychiatric  History:  Tobacco Screening: Have you used any form of tobacco in the last 30 days? (Cigarettes, Smokeless Tobacco, Cigars, and/or Pipes): Yes Tobacco use, Select all that apply: 5 or more cigarettes per day Are you interested in Tobacco Cessation Medications?: Yes, will notify MD for an order Counseled patient on smoking cessation including recognizing danger situations, developing coping skills and basic information  about quitting provided: Refused/Declined practical counseling Social History:  Social History   Substance and Sexual Activity  Alcohol Use Yes   Comment: occ     Social History   Substance and Sexual Activity  Drug Use No    Additional Social History: Marital status: Long term relationship    Pain Medications: See MAR Prescriptions: See MAR Over the Counter: See MAR History of alcohol / drug use?: No history of alcohol / drug abuse Longest period of sobriety (when/how long): NA                    Allergies:    Allergies  Allergen Reactions  . Clindamycin/Lincomycin Anaphylaxis and Swelling  . Gabapentin Shortness Of Breath and Swelling  . Flexeril [Cyclobenzaprine] Other (See Comments)    Restless leg syndrome, numbness   Lab Results:  Results for orders placed or performed during the hospital encounter of 07/12/17 (from the past 48 hour(s))  CBC     Status: Abnormal   Collection Time: 07/13/17  6:14 AM  Result Value Ref Range   WBC 4.9 4.0 - 10.5 K/uL   RBC 3.85 (L) 3.87 - 5.11 MIL/uL   Hemoglobin 10.2 (L) 12.0 - 15.0 g/dL   HCT 32.9 (L) 36.0 - 46.0 %   MCV 85.5 78.0 - 100.0 fL   MCH 26.5 26.0 - 34.0 pg   MCHC 31.0 30.0 - 36.0 g/dL   RDW 16.9 (H) 11.5 - 15.5 %   Platelets 341 150 - 400 K/uL    Comment: Performed at [facility], [place] [address], [place], [place] [address]  Comprehensive metabolic panel     Status: Abnormal   Collection Time: 07/13/17  6:14 AM  Result Value Ref Range   Sodium 143 135 - 145 mmol/L   Potassium 3.7 3.5 - 5.1 mmol/L   Chloride 110 101 - 111 mmol/L   CO2 26 22 - 32 mmol/L   Glucose, Bld 88 65 - 99 mg/dL   BUN 15 6 - 20 mg/dL   Creatinine, Ser 0.47 0.44 - 1.00 mg/dL   Calcium 9.3 8.9 - 10.3 mg/dL   Total Protein 7.5 6.5 - 8.1 g/dL   Albumin 3.9 3.5 - 5.0 g/dL   AST 49 (H) 15 - 41 U/L   ALT 56 (H) 14 - 54 U/L   Alkaline Phosphatase 74 38 - 126 U/L   Total Bilirubin 0.4 0.3 - 1.2 mg/dL   GFR calc non Af Amer >60 >60 mL/min   GFR calc Af Amer >60 >60 mL/min    Comment: (NOTE) The eGFR has been calculated using the CKD EPI equation. This calculation has not been validated in all clinical situations. eGFR's persistently <60 mL/min signify possible Chronic Kidney Disease.    Anion gap 7 5 - 15    Comment: Performed at [facility], [place] [address], [place], [place] [address]  Hemoglobin A1c     Status: None   Collection Time: 07/13/17  6:14 AM  Result Value Ref Range   Hgb A1c MFr Bld 5.1 4.8 - 5.6 %     Comment: (NOTE) Pre diabetes:          5.7%-6.4% Diabetes:              >6.4% Glycemic control for   <7.0% adults with diabetes    Mean Plasma Glucose 99.67 mg/dL    Comment: Performed at [place] [facility] [address] [place], [place] [address]  Ethanol     Status: None   Collection Time: 07/13/17  6:14 AM  Result Value Ref Range   Alcohol, Ethyl (B) <10 <10 mg/dL    Comment:        LOWEST DETECTABLE LIMIT FOR SERUM ALCOHOL IS 10 mg/dL FOR MEDICAL PURPOSES ONLY Performed at [facility] [address], [place], [place] [address]   Lipid panel     Status: None   Collection Time: 07/13/17  6:14 AM  Result Value Ref Range   Cholesterol 167 0 - 200 mg/dL   Triglycerides 59 <150 mg/dL   HDL 73 >40 mg/dL   Total CHOL/HDL Ratio 2.3 RATIO   VLDL 12 0 - 40 mg/dL   LDL Cholesterol 82 0 - 99 mg/dL    Comment:        Total Cholesterol/HDL:CHD Risk Coronary Heart Disease Risk Table                     Men   Women  1/2 Average Risk   3.4   3.3  Average Risk       5.0   4.4  2 X Average Risk   9.6   7.1  3 X Average Risk  23.4   11.0        Use the calculated Patient Ratio above and the CHD Risk Table to determine the patient's CHD Risk.        ATP III CLASSIFICATION (LDL):  <100     mg/dL   Optimal  100-129  mg/dL   Near or Above                    Optimal  130-159  mg/dL   Borderline  160-189  mg/dL   High  >190     mg/dL   Very High Performed at [facility] [address], [place], [place] [address]   TSH     Status: Abnormal   Collection Time: 07/13/17  6:14 AM  Result Value Ref Range   TSH 4.950 (H) 0.350 - 4.500 uIU/mL    Comment: Performed by a 3rd Generation assay with a functional sensitivity of <=0.01 uIU/mL. Performed at [facility], [place] [address], [place], [place] [address]     Blood Alcohol level:  Lab Results  Component Value Date   [facility] <10 07/13/2017   ETH <10 23/30/0762    Metabolic  Disorder Labs:  Lab Results  Component Value Date   HGBA1C 5.1 07/13/2017   MPG 99.67 07/13/2017   No results found for: PROLACTIN Lab Results  Component Value Date   CHOL 167 07/13/2017   TRIG 59 07/13/2017   HDL 73 07/13/2017   CHOLHDL 2.3 07/13/2017   VLDL 12 07/13/2017   LDLCALC 82 07/13/2017    Current Medications: Current Facility-Administered Medications  Medication Dose Route Frequency Provider Last Rate Last Dose  . acetaminophen (TYLENOL) tablet 650 mg  650 mg Oral Q6H PRN Rankin, Shuvon B, NP      . alum & mag hydroxide-simeth (MAALOX/MYLANTA) 200-200-20 MG/5ML suspension 30 mL  30 mL Oral Q4H PRN Rankin, Shuvon B, NP      . famotidine (PEPCID) tablet 20 mg  20 mg Oral BID Rankin, Shuvon B, NP   20 mg at 07/13/17 0756  . feeding supplement (ENSURE ENLIVE) (ENSURE ENLIVE) liquid 237 mL  237 mL Oral BID BM Pennelope Bracken, MD   237 mL at 07/13/17 1429  .  ferrous sulfate tablet 325 mg  325 mg Oral Q breakfast Pennelope Bracken, MD   325 mg at 07/13/17 1209  . HYDROcodone-acetaminophen (NORCO/VICODIN) 5-325 MG per tablet 1 tablet  1 tablet Oral Q6H PRN Laverle Hobby, PA-C   1 tablet at 07/13/17 1431  . hydrOXYzine (ATARAX/VISTARIL) tablet 50 mg  50 mg Oral Q6H PRN Pennelope Bracken, MD      . ibuprofen (ADVIL,MOTRIN) tablet 600 mg  600 mg Oral Q6H PRN Patriciaann Clan E, PA-C   600 mg at 07/12/17 2127  . magnesium hydroxide (MILK OF MAGNESIA) suspension 30 mL  30 mL Oral Daily PRN Rankin, Shuvon B, NP      . mirtazapine (REMERON) tablet 15 mg  15 mg Oral QHS Pennelope Bracken, MD      . multivitamin with minerals tablet 1 tablet  1 tablet Oral Daily Hampton Abbot, MD   1 tablet at 07/13/17 1208  . nicotine polacrilex (NICORETTE) gum 2 mg  2 mg Oral PRN Pennelope Bracken, MD      . OLANZapine (ZYPREXA) tablet 10 mg  10 mg Oral QHS Pennelope Bracken, MD      . ondansetron [facility]) tablet 4 mg  4 mg Oral Q8H PRN Pennelope Bracken, MD      . traZODone (DESYREL) tablet 50 mg  50 mg Oral QHS PRN Rankin, Shuvon B, NP   50 mg at 07/12/17 2127   PTA Medications: Medications Prior to Admission  Medication Sig Dispense Refill Last Dose  . butalbital-acetaminophen-caffeine (FIORICET, ESGIC) 50-325-40 MG tablet Take 1-2 tablets by mouth every 6 (six) hours as needed for migraine.   07/11/2017  . cycloSPORINE (RESTASIS) 0.05 % ophthalmic emulsion Place 1 drop into both eyes 2 (two) times daily.   07/12/2017  . diazepam (VALIUM) 5 MG tablet Take 1 tablet (5 mg total) by mouth daily as needed for anxiety. 1 tablet 0 07/11/2017  . famotidine (PEPCID) 20 MG tablet Take 1 tablet (20 mg total) by mouth 2 (two) times daily. For GERD 10 tablet 0 07/12/2017  . HYDROcodone-acetaminophen (NORCO/VICODIN) 5-325 MG tablet Take 1 tablet by mouth every 6 (six) hours as needed for moderate pain. 1 tablet 0 07/12/2017  . hydrOXYzine (ATARAX/VISTARIL) 25 MG tablet Take 1 tablet (25 mg total) by mouth 3 (three) times daily as needed (mild/moderate anxiety). 60 tablet 0 07/13/2017  . OLANZapine (ZYPREXA) 5 MG tablet Take 1 tablet (5 mg total) by mouth at bedtime. For mood control 30 tablet 0 07/12/2017  . Pediatric Multivit-Minerals-C (FLINTSTONES GUMMIES PO) Take 1 tablet by mouth daily.   Past Week  . promethazine (PHENERGAN) 25 MG tablet Take 25 mg by mouth every 6 (six) hours as needed for nausea or vomiting.   07/11/2017  . traZODone (DESYREL) 50 MG tablet Take 1 tablet (50 mg) by mouth at bedtime: For sleep (Patient taking differently: Take 100 mg by mouth at bedtime as needed for sleep. ) 30 tablet 0 07/12/2017  . lidocaine (LIDODERM) 5 % Place 1 patch onto the skin daily as needed (For pain.). Remove & Discard patch within 12 hours or as directed by MD    not started yet    Musculoskeletal: Strength & Muscle Tone: within normal limits Gait & Station: normal Patient leans: N/A  Psychiatric Specialty Exam: Physical Exam  Nursing note and vitals  reviewed.   Review of Systems  Constitutional: Negative for chills and fever.  Respiratory: Negative for cough and shortness of breath.  Gastrointestinal: Positive for nausea. Negative for abdominal pain, heartburn and vomiting.  Musculoskeletal: Positive for back pain.  Psychiatric/Behavioral: Positive for depression and suicidal ideas. Negative for hallucinations. The patient is not nervous/anxious and does not have insomnia.     Blood pressure 108/72, pulse (!) 102, temperature 98.1 F (36.7 C), temperature source Oral, resp. rate 16, height 5' 3"  (1.6 m), weight 36.3 kg (80 lb), SpO2 100 %, unknown if currently breastfeeding.Body mass index is 14.17 kg/m.  General Appearance: Casual and Fairly Groomed  Eye Contact:  Good  Speech:  Clear and Coherent and Normal Rate  Volume:  Normal  Mood:  Anxious and Depressed  Affect:  Congruent, Constricted and Depressed  Thought Process:  Coherent and Goal Directed  Orientation:  Full (Time, Place, and Person)  Thought Content:  Logical and Hallucinations: Auditory  Suicidal Thoughts:  Yes.  with intent/plan  Homicidal Thoughts:  No  Memory:  Immediate;   Fair Recent;   Fair Remote;   Fair  Judgement:  Intact  Insight:  Shallow  Psychomotor Activity:  Normal  Concentration:  Concentration: Fair  Recall:  [employer] of Knowledge:  Fair  Language:  Fair  Akathisia:  No  Handed:    AIMS (if indicated):     Assets:  Resilience  ADL's:  Intact  Cognition:  WNL  Sleep:  Number of Hours: 6.25    Treatment Plan Summary: Daily contact with patient to assess and evaluate symptoms and progress in treatment and Medication management  Observation Level/Precautions:  15 minute checks  Laboratory:  CBC Chemistry Profile HbAIC HCG UDS  Psychotherapy:  Encourage participation in groups and therapeutic milieu   Medications:  Change zyprexa 14m qhs to zyprexa 171mpo qhs. Start remeron 1549mo qhs.  Consultations:    Discharge Concerns:     Estimated LOS: 5-7 days  Other:     Physician Treatment Plan for Primary Diagnosis: Bipolar I disorder, most recent episode (or current) depressed ([place] Term Goal(s): Improvement in symptoms so as ready for discharge  Short Term Goals: Ability to demonstrate self-control will improve and Ability to identify and develop effective coping behaviors will improve  Physician Treatment Plan for Secondary Diagnosis: Principal Problem:   Bipolar I disorder, most recent episode (or current) depressed ([facility] Term Goal(s): Improvement in symptoms so as ready for discharge  Short Term Goals: Ability to identify changes in lifestyle to reduce recurrence of condition will improve  I certify that inpatient services furnished can reasonably be expected to improve the patient's condition.    ChrPennelope BrackenD 3/28/20194:25 PM

## 2017-07-13 NOTE — Progress Notes (Signed)
Recreation Therapy Notes  INPATIENT RECREATION THERAPY ASSESSMENT  Patient Details Name: Judy Graham MRN: [id number] DOB: 08/13/1990 Today's Date: 07/13/2017     Patient admitted to unit 3.27.19. Due to admission within last year, no new assessment conducted at this time. Last assessment conducted 3.18.19. Patient reports minimal changes in stressors from previous admission. Patient reports being admitted to the hospital voluntarily due to having suicidal thoughts without a plan. Patient reports husband as a stressor. Patient reports leaving her husband due to catching him with her best friend    Patient denies SI, HI, AVH at this time.   Information found below from assessment conducted 3.19.19                                                              Information Obtained From: Patient  Able to Participate in Assessment/Interview: Yes  Patient Presentation: Responsive  Reason for Admission (Per Patient): Suicide Attempt Patient reports being admitted into the hospital because of suicide attempt due to depression and PTSD.   Patient Stressors: Relationship Patient reports her relationship as a stressor because her fiance has been cheating on her with her best friend  Coping Skills:   Isolation, Arguments, Aggression, Substance Abuse, Meditate, Deep Breathing, Journal, Write, TV, Read  Leisure Interests (2+):  Playing with kids, coloring, talking to others   Frequency of Recreation/Participation: Weekly   Awareness of Community Resources:  Yes   Community Resources:  Movies, Water quality scientistarks, Tree surgeonMall   Current Use: Yes   Expressed Interest in State Street CorporationCommunity Resource Information: No   [place] of Residence[place]  [place]  Patient Main Form of Transportation: Set designerCar   Patient Strengths:  Being a mom, good listener, good talker   Patient Identified Areas of Improvement:  To get on the right medicines   Patient Goal for Hospitalization:  To leave   Current SI  (including self-harm):  No  Current HI:  No  Current AVH: No  Staff Intervention Plan: Group Attendance, Collaborate with Interdisciplinary Treatment Team  Consent to Intern Participation: Yes  Sheryle Hailarian Kiam Bransfield, Recreation Therapy Intern   Sheryle HailDarian Jaydyn Menon 07/13/2017, 2:35 PM

## 2017-07-13 NOTE — [facility] Suicide Risk Assessment (Addendum)
[facility] INPATIENT:  Family/Significant Other Suicide Prevention Education  Suicide Prevention Education:  Contact Attempts: Ronny BaconSteve Kouba [phone] (Father) has been identified by the patient as the family member/significant other with whom the patient will be residing, and identified as the person(s) who will aid the patient in the event of a mental health crisis.  With written consent from the patient, two attempts were made to provide suicide prevention education, prior to and/or following the patient's discharge.  We were unsuccessful in providing suicide prevention education.  A suicide education pamphlet was given to the patient to share with family/significant other.  Date and time of first attempt: 07/13/17, 11:01am, phone continues to ring and no voicemail is proveded Date and time of second attempt: 07/14/17, 10:05am, Left voicemail   CSW spoke with sister this AM at the above number.  She also stays with father along with her son.  Father was in bed asleep.  Sister confirmed plan for pt to come stay with them.  Went over treatment team recommendations and crises plan with sister.  Metro Kungngel M Webster 07/13/2017, 11:01 AM

## 2017-07-13 NOTE — [facility] Group Notes (Signed)
LCSW Group Therapy 07/13/2017 1:15pm  Type of Therapy and Topic:  Group Therapy:  Change and Accountability  Participation Level:  Active  Description of Group In this group, patients discussed power and accountability for change.  The group identified the challenges related to accountability and the difficulty of accepting the outcomes of negative behaviors.  Patients were encouraged to openly discuss a challenge/change they could take responsibility for.  Patients discussed the use of "change talk" and positive thinking as ways to support achievement of personal goals.  The group discussed ways to give support and empowerment to peers.  Therapeutic Goals: 1. Patients will state the relationship between personal power and accountability in the change process 2. Patients will identify the positive and negative consequences of a personal choice they have made 3. Patients will identify one challenge/choice they will take responsibility for making 4. Patients will discuss the role of "change talk" and the impact of positive thinking as it supports successful personal change 5. Patients will verbalize support and affirmation of change efforts in peers  Summary of Patient Progress:  Judy Graham attended group and remained there the entire time.  Even though the room was warm, she was wrapped in a blanket and stated that the room was cold.  When asked the question if money was no option what would you do, she stated that she would move away to [place] because there are to many triggers around [place] and it reminds her of the people in her life who have passed away.     Therapeutic Modalities Solution Focused Brief Therapy Motivational Interviewing Cognitive Behavioral Therapy  Judy Heraldngel M Demontrez Graham, Student-Social Work 07/13/2017 11:12 AM

## 2017-07-13 NOTE — Progress Notes (Signed)
Recreation Therapy Notes  Date: 3.28.19 Time: 10:00 a.m. Location: [address] Day[address]  Group Topic: Wellness   Goal Area(s) Addresses:  To promote physical health  -Patient will participate in recreation Therapy group tx. -Patient will identify the importance of physical  -Patient will instruct at least two dances/ exercise moves   Behavioral Response: Appropriate, Passively Engaged   Intervention: Exercise   Activity: Zumba: Patients contributed at least two freestyle dances/exercise moves towards group activity to increase physical activity    Education: Wellness   Education Outcome: Acknowledges Education  Clinical Observations/Feedback: Patient attended Recreation Therapy group treatment. Patient passively engaged in group activity periodically participating. Patient communicated with peers in an appropriate manner. Patient was able to identify the importance of physical health. Patient met Goal 1.1 (see above).    Ranell Patrick, Recreation Therapy Intern   Ranell Patrick 07/13/2017 9:34 AM

## 2017-07-13 NOTE — [facility] Suicide Risk Assessment (Signed)
[facility]   Nursing information obtained from:    Demographic factors:    Current Mental Status:    Loss Factors:    Historical Factors:    Risk Reduction Factors:     Total Time spent with patient: 1 hour Principal Problem: Bipolar I disorder, most recent episode (or current) depressed (HCC) Diagnosis:   Patient Active Problem List   Diagnosis Date Noted  . Bipolar I disorder, most recent episode (or current) depressed (HCC) [F31.30] 07/12/2017  . Bipolar disorder (HCC) [F31.9] 07/02/2017  . Suicidal overdose (HCC) [T50.902A] 07/01/2017  . Depression [F32.9]   . Tricyclic overdose, intentional self-harm, initial encounter (HCC) [T43.012A] 06/25/2017  . Acute respiratory failure (HCC) [J96.00] 06/25/2017  . Chronic hepatitis C without hepatic coma (HCC) [B18.2] 04/28/2017  . 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 Abdominal pain [R10.9] 07/01/2016  . Chronic headache [R51] 06/17/2016  . Bipolar and related disorder (HCC) [F31.9] 06/05/2015  . IUGR (intrauterine growth restriction) affecting care of mother [O36.5990] 08/14/2014  . Cigarette smoker [F17.210]   . MRSA (methicillin resistant staph aureus) culture positive [Z22.322] 05/26/2014  . Abnormal findings on antenatal screening [O28.9]   . Elevated AFP [R77.2]   . Choroid plexus cyst of fetus [O35.0XX0]   . Abnormal MSAFP (maternal serum alpha-fetoprotein), elevated [O28.0]   . High risk pregnancy with high inhibin [O28.8, O09.899]   . Choroid plexus cyst [G93.0]   . UTI (urinary tract infection) in pregnancy in second trimester [O23.42] 03/08/2014  . Marijuana use [F12.90] 03/08/2014  . Abnormal quad screen [O28.0] 03/08/2014  . Late prenatal care affecting pregnancy in second trimester, antepartum [O09.32] 03/04/2014  . Scoliosis [M41.9] 03/04/2014  . Anxiety disorder [F41.9] 09/05/2013  . Adjustment disorder with mixed emotional features  [F43.29] 09/01/2013  . S/P spinal fusion [Z98.1] 08/29/2011   Subjective Data: See H&P for details  Continued Clinical Symptoms:  Alcohol Use Disorder Identification Test Final Score (AUDIT): 2 The "Alcohol Use Disorders Identification Test", Guidelines for Use in Primary Care, Second Edition.  World Science writerHealth Organization [facility](WHO). Score between 0-7:  no or low risk or alcohol related problems. Score between 8-15:  moderate risk of alcohol related problems. Score between 16-19:  high risk of alcohol related problems. Score 20 or above:  warrants further diagnostic evaluation for alcohol dependence and treatment.   CLINICAL FACTORS:   Severe Anxiety and/or Agitation Bipolar Disorder:   Depressive phase   Psychiatric Specialty Exam: Physical Exam  Nursing note and vitals reviewed.   ROS - see H&P for details  Blood pressure 108/72, pulse (!) 102, temperature 98.1 F (36.7 C), temperature source Oral, resp. rate 16, height 5\' 3"  (1.6 m), weight 36.3 kg (80 lb), SpO2 100 %, unknown if currently breastfeeding.Body mass index is 14.17 kg/m.    COGNITIVE FEATURES THAT CONTRIBUTE TO RISK:  None    SUICIDE RISK:   Mild:  Suicidal ideation of limited frequency, intensity, duration, and specificity.  There are no identifiable plans, no associated intent, mild dysphoria and related symptoms, good self-control (both objective and subjective assessment), few other risk factors, and identifiable protective factors, including available and accessible social support.  PLAN OF CARE: See H&P for details  I certify that inpatient services furnished can reasonably be expected to improve the patient's condition.   Micheal Likenshristopher T Rockland Kotarski, MD 07/13/2017, 4:36 PM

## 2017-07-13 NOTE — Progress Notes (Signed)
Patient ID: Judy Graham, female   DOB: 06-04-1990, 27 y.o.   MRN: [id number]  D: Patient with blunted affect, depressed mood, is frail, and currently endorses +SI, denies having a plan, denies AVH/HI.  Patient complaining during AM meds of excruciating back pain of 8/10 which is constant in nature, and was medicated with Norco PRN at 756 AM.  Patient also complained of feeling anxious, and was medicated with Atarax 25mg  at 0756AM.  Pt states that her sleep quality ,last night was good, and states her appetite is good, rates her energy level as low, rates her concentration level as good.  Pt also rates her depression today as 6 (10 being the worst), her hopelessness level as 2 (10 being the worst), and her anxiety level as 7 (10 being the worst).  Pt states that what is most important for her today is to learn coping skills, and talk to the providers.    A: All meds being given as ordered, pt educated on her medications and verbalized understanding.  Pt maintained on Q15 minute checks for safety.  R: Pt does not seem to be in any apparent distress, will continue to monitor.
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
## 2017-07-13 NOTE — Progress Notes (Signed)
Adult Psychoeducational Group Note  Date:  07/13/2017 Time:  8:43 PM  Group Topic/Focus:  Wrap-Up Group:   The focus of this group is to help patients review their daily goal of treatment and discuss progress on daily workbooks.  Participation Level:  Active  Participation Quality:  Appropriate  Affect:  Appropriate  Cognitive:  Alert  Insight: Appropriate  Engagement in Group:  Engaged  Modes of Intervention:  Discussion  Additional Comments:  Patient stated her day started off good but eventually got rough due to panic attacks. Patient stated she feels better now. Patient's goal for today was "to come up with coping skills to distract panic attacks". Patient's coping skills were, music, relaxation, and meditating.   Hulet Ehrmann L Sawsan Riggio 07/13/2017, 8:43 PM

## 2017-07-14 DIAGNOSIS — Z630 Problems in relationship with spouse or partner: Secondary | ICD-10-CM

## 2017-07-14 DIAGNOSIS — G4700 Insomnia, unspecified: Secondary | ICD-10-CM

## 2017-07-14 DIAGNOSIS — R450 Nervousness: Secondary | ICD-10-CM

## 2017-07-14 DIAGNOSIS — D649 Anemia, unspecified: Secondary | ICD-10-CM

## 2017-07-14 DIAGNOSIS — K219 Gastro-esophageal reflux disease without esophagitis: Secondary | ICD-10-CM

## 2017-07-14 LAB — RAPID URINE DRUG SCREEN, HOSP PERFORMED
Amphetamines: NOT DETECTED
BARBITURATES: NOT DETECTED
BENZODIAZEPINES: NOT DETECTED
Cocaine: NOT DETECTED
Opiates: POSITIVE — AB
TETRAHYDROCANNABINOL: NOT DETECTED

## 2017-07-14 LAB — URINALYSIS, COMPLETE (UACMP) WITH MICROSCOPIC
Bilirubin Urine: NEGATIVE
GLUCOSE, UA: NEGATIVE mg/dL
KETONES UR: NEGATIVE mg/dL
Nitrite: NEGATIVE
PH: 6 (ref 5.0–8.0)
Protein, ur: NEGATIVE mg/dL
Specific Gravity, Urine: 1.004 — ABNORMAL LOW (ref 1.005–1.030)

## 2017-07-14 LAB — PREGNANCY, URINE: Preg Test, Ur: NEGATIVE

## 2017-07-14 LAB — PROLACTIN: Prolactin: 56.2 ng/mL — ABNORMAL HIGH (ref 4.8–23.3)

## 2017-07-14 MED ORDER — INFLUENZA VAC SPLIT QUAD 0.5 ML IM SUSY
0.5000 mL | PREFILLED_SYRINGE | INTRAMUSCULAR | Status: AC
  Administered 2017-07-16: 0.5 mL via INTRAMUSCULAR
  Filled 2017-07-14: qty 0.5

## 2017-07-14 NOTE — Plan of Care (Signed)
Judy Graham reports that she has been more depressed and anxious today.  She stated she knows that she is safe here but dealing with the situations that brought her here have been difficult.  She was encouraged to attend groups and participate in group activities to help with new coping skills.  We will continue to monitor the progress towards her goals.

## 2017-07-14 NOTE — Progress Notes (Signed)
Recreation Therapy Notes  Date: 07/14/17 Time: 1000 Location: [address] Dayroom  Group Topic: Leisure Education  Goal Area(s) Addresses:  Patient will identify positive leisure activities.  Patient will identify one positive benefit of participation in leisure activities.   Behavioral Response: Minimal  Intervention: AT&TWhite board, marker, eraser, names of leisure activities  Activity: Leisure Pictionary.  Each patient was had to pick a leisure activity from the container and draw it on the board.  The remaining group members were to guess what was being drawn.  The person that guessed the picture would get the next turn.  Education:  Leisure Education, Building control surveyorDischarge Planning  Education Outcome: Acknowledges education/In group clarification offered/Needs additional education  Clinical Observations/Feedback: Pt arrived near the end of group.  Pt gave minimal participation.    Caroll RancherMarjette Everard Interrante, LRT/CTRS     Lillia AbedLindsay, Debie Ashline A 07/14/2017 10:45 AM

## 2017-07-14 NOTE — Progress Notes (Signed)
  D: When asked about her day pt stated, it's been a mix. I'm happy, then depressed, crying spells. I think my medicine is trying to kick in. But i'm better than I was". Pt denied SI during assessment, but stated she was had suicidal thoughts "this morning". Pt has no questions or concerns.    A:  Support and encouragement was offered. 15 min checks continued for safety.  R: Pt remains safe.

## 2017-07-14 NOTE — Progress Notes (Signed)
[facility] MD Progress Note  07/14/2017 9:25 AM Judy Graham  MRN:  [id number] Subjective:    Judy Graham is a 27 y/o F with history of Bipolar I who was admitted voluntarily with worsening symptoms of depression, restrictive eating pattern, AH, and SI with multiple plans in the context of discovering that her husband has been cheating on her. Pt has recent relevant history of admission to [facility] with discharge on 07/05/17. She reported that she has been adherent to her outpatient medication regimen. She was restarted on previous medication of olanzapine at higher dose than previous discharge, and she was also started on trial of remeron for mood symptoms and to stimulate appetite.  Upon evaluation today, pt shares, "I'm doing okay." Pt shares that she is still struggling with some depressed mood and SI without specific plan today, stating, "I had some suicidal thoughts earlier this morning but they're better now." Pt is able to safety plan and contract for safety while admitted to the hospital. She denies HI. She reports AH of "static" but notes that it is slightly improved compared to initial presentation. She denies VH. She notes that she slept well. Her appetite is fair. She denies any side effects to her medications, and she is in agreement to continue her current regimen without changes. She would like to go to a shelter after discharge, but she does not feel ready at this time due to ongoing mood symptoms, and she is in agreement to continue inpatient hospitalization at this time.  Principal Problem: Bipolar I disorder, most recent episode (or current) depressed ([place]) Diagnosis:   Patient Active Problem List   Diagnosis Date Noted  . Bipolar I disorder, most recent episode (or current) depressed ([place]) [F31.30] 07/12/2017  . Bipolar disorder ([place]) [F31.9] 07/02/2017  . Suicidal overdose ([place]) [T50.902A] 07/01/2017  . Depression [F32.9]   . Tricyclic overdose, intentional self-harm, initial  encounter ([place]) [T43.012A] 06/25/2017  . Acute respiratory failure ([place]) [J96.00] 06/25/2017  . Chronic hepatitis C without hepatic coma ([place]) [B18.2] 04/28/2017  . 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 Abdominal pain [R10.9] 07/01/2016  . Chronic headache [R51] 06/17/2016  . Bipolar and related disorder ([place]) [F31.9] 06/05/2015  . IUGR (intrauterine growth restriction) affecting care of mother [O36.5990] 08/14/2014  . Cigarette smoker [F17.210]   . MRSA (methicillin resistant staph aureus) culture positive [Z22.322] 05/26/2014  . Abnormal findings on antenatal screening [O28.9]   . Elevated AFP [R77.2]   . Choroid plexus cyst of fetus [O35.3AT5]   . Abnormal MSAFP (maternal serum alpha-fetoprotein), elevated [O28.0]   . High risk pregnancy with high inhibin [O28.8, O09.899]   . Choroid plexus cyst [G93.0]   . UTI (urinary tract infection) in pregnancy in second trimester [O23.42] 03/08/2014  . Marijuana use [F12.90] 03/08/2014  . Abnormal quad screen [O28.0] 03/08/2014  . Late prenatal care affecting pregnancy in second trimester, antepartum [O09.32] 03/04/2014  . Scoliosis [M41.9] 03/04/2014  . Anxiety disorder [F41.9] 09/05/2013  . Adjustment disorder with mixed emotional features [F43.29] 09/01/2013  . S/P spinal fusion [Z98.1] 08/29/2011   Total Time spent with patient: 30 minutes  Past Psychiatric History: see H&P  Past Medical History:  Past Medical History:  Diagnosis Date  . Headache   . Scoliosis   . Seizures ([place])     Past Surgical History:  Procedure Laterality Date  . BACK SURGERY    . KNEE SURGERY     Family History:  Family History  Problem Relation  Age of Onset  . Cancer Mother   . Cancer Maternal Grandmother   . Diabetes Paternal Grandmother   . Stroke Paternal Grandmother   . Hypertension Father    Family Psychiatric  History: See H&P Social History:  Social History   Substance and Sexual  Activity  Alcohol Use Yes   Comment: occ     Social History   Substance and Sexual Activity  Drug Use No    Social History   Socioeconomic History  . Marital status: Single    Spouse name: Not on file  . Number of children: Not on file  . Years of education: Not on file  . Highest education level: Not on file  Occupational History  . Not on file  Social Needs  . Financial resource strain: Not on file  . Food insecurity:    Worry: Not on file    Inability: Not on file  . Transportation needs:    Medical: Not on file    Non-medical: Not on file  Tobacco Use  . Smoking status: Current Every Day Smoker    Packs/day: 0.50    Types: Cigarettes  . Smokeless tobacco: Never Used  Substance and Sexual Activity  . Alcohol use: Yes    Comment: occ  . Drug use: No  . Sexual activity: Yes    Birth control/protection: None  Lifestyle  . Physical activity:    Days per week: Not on file    Minutes per session: Not on file  . Stress: Not on file  Relationships  . Social connections:    Talks on phone: Not on file    Gets together: Not on file    Attends religious service: Not on file    Active member of club or organization: Not on file    Attends meetings of clubs or organizations: Not on file    Relationship status: Not on file  Other Topics Concern  . Not on file  Social History Narrative  . Not on file   Additional Social History:    Pain Medications: See MAR Prescriptions: See MAR Over the Counter: See MAR History of alcohol / drug use?: No history of alcohol / drug abuse Longest period of sobriety (when/how long): NA                    Sleep: Good  Appetite:  Fair  Current Medications: Current Facility-Administered Medications  Medication Dose Route Frequency Provider Last Rate Last Dose  . acetaminophen (TYLENOL) tablet 650 mg  650 mg Oral Q6H PRN Rankin, Shuvon B, NP      . alum & mag hydroxide-simeth (MAALOX/MYLANTA) 200-200-20 MG/5ML suspension  30 mL  30 mL Oral Q4H PRN Rankin, Shuvon B, NP      . famotidine (PEPCID) tablet 20 mg  20 mg Oral BID Rankin, Shuvon B, NP   20 mg at 07/14/17 0819  . feeding supplement (ENSURE ENLIVE) (ENSURE ENLIVE) liquid 237 mL  237 mL Oral BID BM Pennelope Bracken, MD   237 mL at 07/13/17 1429  . ferrous sulfate tablet 325 mg  325 mg Oral Q breakfast Pennelope Bracken, MD   325 mg at 07/14/17 0818  . HYDROcodone-acetaminophen (NORCO/VICODIN) 5-325 MG per tablet 1 tablet  1 tablet Oral Q6H PRN Laverle Hobby, PA-C   1 tablet at 07/14/17 2947  . hydrOXYzine (ATARAX/VISTARIL) tablet 50 mg  50 mg Oral Q6H PRN Pennelope Bracken, MD   50 mg at 07/13/17  1709  . ibuprofen (ADVIL,MOTRIN) tablet 600 mg  600 mg Oral Q6H PRN Laverle Hobby, PA-C   600 mg at 07/12/17 2127  . magnesium hydroxide (MILK OF MAGNESIA) suspension 30 mL  30 mL Oral Daily PRN Rankin, Shuvon B, NP      . mirtazapine (REMERON) tablet 15 mg  15 mg Oral QHS Pennelope Bracken, MD   15 mg at 07/13/17 2103  . multivitamin with minerals tablet 1 tablet  1 tablet Oral Daily Hampton Abbot, MD   1 tablet at 07/13/17 1208  . nicotine polacrilex (NICORETTE) gum 2 mg  2 mg Oral PRN Pennelope Bracken, MD      . OLANZapine (ZYPREXA) tablet 10 mg  10 mg Oral QHS Pennelope Bracken, MD   10 mg at 07/13/17 2103  . ondansetron (ZOFRAN) tablet 4 mg  4 mg Oral Q8H PRN Pennelope Bracken, MD   4 mg at 07/13/17 1709  . traZODone (DESYREL) tablet 50 mg  50 mg Oral QHS PRN Rankin, Shuvon B, NP   50 mg at 07/12/17 2127    Lab Results:  Results for orders placed or performed during the hospital encounter of 07/12/17 (from the past 48 hour(s))  CBC     Status: Abnormal   Collection Time: 07/13/17  6:14 AM  Result Value Ref Range   WBC 4.9 4.0 - 10.5 K/uL   RBC 3.85 (L) 3.87 - 5.11 MIL/uL   Hemoglobin 10.2 (L) 12.0 - 15.0 g/dL   HCT 32.9 (L) 36.0 - 46.0 %   MCV 85.5 78.0 - 100.0 fL   MCH 26.5 26.0 - 34.0 pg   MCHC  31.0 30.0 - 36.0 g/dL   RDW 16.9 (H) 11.5 - 15.5 %   Platelets 341 150 - 400 K/uL    Comment: Performed at [facility], [place] [address], [place], [place] [address]  Comprehensive metabolic panel     Status: Abnormal   Collection Time: 07/13/17  6:14 AM  Result Value Ref Range   Sodium 143 135 - 145 mmol/L   Potassium 3.7 3.5 - 5.1 mmol/L   Chloride 110 101 - 111 mmol/L   CO2 26 22 - 32 mmol/L   Glucose, Bld 88 65 - 99 mg/dL   BUN 15 6 - 20 mg/dL   Creatinine, Ser 0.47 0.44 - 1.00 mg/dL   Calcium 9.3 8.9 - 10.3 mg/dL   Total Protein 7.5 6.5 - 8.1 g/dL   Albumin 3.9 3.5 - 5.0 g/dL   AST 49 (H) 15 - 41 U/L   ALT 56 (H) 14 - 54 U/L   Alkaline Phosphatase 74 38 - 126 U/L   Total Bilirubin 0.4 0.3 - 1.2 mg/dL   GFR calc non Af Amer >60 >60 mL/min   GFR calc Af Amer >60 >60 mL/min    Comment: (NOTE) The eGFR has been calculated using the CKD EPI equation. This calculation has not been validated in all clinical situations. eGFR's persistently <60 mL/min signify possible Chronic Kidney Disease.    Anion gap 7 5 - 15    Comment: Performed at [facility], [place] [address] [place], [place] [address]  Hemoglobin A1c     Status: None   Collection Time: 07/13/17  6:14 AM  Result Value Ref Range   Hgb A1c MFr Bld 5.1 4.8 - 5.6 %    Comment: (NOTE) Pre diabetes:          5.7%-6.4% Diabetes:              >  6.4% Glycemic control for   <7.0% adults with diabetes    Mean Plasma Glucose 99.67 mg/dL    Comment: Performed at [place] [address], [place], [place] [address]  Ethanol     Status: None   Collection Time: 07/13/17  6:14 AM  Result Value Ref Range   Alcohol, Ethyl (B) <10 <10 mg/dL    Comment:        LOWEST DETECTABLE LIMIT FOR SERUM ALCOHOL IS 10 mg/dL FOR MEDICAL PURPOSES ONLY Performed at [facility] [address] [place], [place] [address]   Lipid panel     Status: None   Collection Time: 07/13/17   6:14 AM  Result Value Ref Range   Cholesterol 167 0 - 200 mg/dL   Triglycerides 59 <150 mg/dL   HDL 73 >40 mg/dL   Total CHOL/HDL Ratio 2.3 RATIO   VLDL 12 0 - 40 mg/dL   LDL Cholesterol 82 0 - 99 mg/dL    Comment:        Total Cholesterol/HDL:CHD Risk Coronary Heart Disease Risk Table                     Men   Women  1/2 Average Risk   3.4   3.3  Average Risk       5.0   4.4  2 X Average Risk   9.6   7.1  3 X Average Risk  23.4   11.0        Use the calculated Patient Ratio above and the CHD Risk Table to determine the patient's CHD Risk.        ATP III CLASSIFICATION (LDL):  <100     mg/dL   Optimal  100-129  mg/dL   Near or Above                    Optimal  130-159  mg/dL   Borderline  160-189  mg/dL   High  >190     mg/dL   Very High Performed at [facility] [address], [place], [place] [address]   TSH     Status: Abnormal   Collection Time: 07/13/17  6:14 AM  Result Value Ref Range   TSH 4.950 (H) 0.350 - 4.500 uIU/mL    Comment: Performed by a 3rd Generation assay with a functional sensitivity of <=0.01 uIU/mL. Performed at [facility], [place] [address], [place], [place] [address]   Prolactin     Status: Abnormal   Collection Time: 07/13/17  6:14 AM  Result Value Ref Range   Prolactin 56.2 (H) 4.8 - 23.3 ng/mL    Comment: (NOTE) Performed At: [facility] [place], [place] [id number] Rush Farmer MD OQ:[id number] Performed at [facility], [place] [address] [place], [place] [address]     Blood Alcohol level:  Lab Results  Component Value Date   [facility] <10 07/13/2017   ETH <10 81/27/5170    Metabolic Disorder Labs: Lab Results  Component Value Date   HGBA1C 5.1 07/13/2017   MPG 99.67 07/13/2017   Lab Results  Component Value Date   PROLACTIN 56.2 (H) 07/13/2017   Lab Results  Component Value Date   CHOL 167 07/13/2017   TRIG 59 07/13/2017   HDL 73 07/13/2017    CHOLHDL 2.3 07/13/2017   VLDL 12 07/13/2017   LDLCALC 82 07/13/2017    Physical Findings: AIMS: Facial and  Oral Movements Muscles of Facial Expression: None, normal Lips and Perioral Area: None, normal Jaw: None, normal Tongue: None, normal,Extremity Movements Upper (arms, wrists, hands, fingers): None, normal Lower (legs, knees, ankles, toes): None, normal, Trunk Movements Neck, shoulders, hips: None, normal, Overall Severity Severity of abnormal movements (highest score from questions above): None, normal Incapacitation due to abnormal movements: None, normal Patient's awareness of abnormal movements (rate only patient's report): No Awareness, Dental Status Current problems with teeth and/or dentures?: No Does patient usually wear dentures?: No  CIWA:    COWS:     Musculoskeletal: Strength & Muscle Tone: within normal limits Gait & Station: normal Patient leans: N/A  Psychiatric Specialty Exam: Physical Exam  Nursing note and vitals reviewed.   Review of Systems  Constitutional: Negative for chills and fever.  Respiratory: Negative for cough and shortness of breath.   Cardiovascular: Negative for chest pain and palpitations.  Gastrointestinal: Negative for abdominal pain, heartburn, nausea and vomiting.  Psychiatric/Behavioral: Positive for depression, hallucinations and suicidal ideas. The patient is nervous/anxious. The patient does not have insomnia.     Blood pressure 117/78, pulse 100, temperature 98.3 F (36.8 C), temperature source Oral, resp. rate 18, height 5' 3"  (1.6 m), weight 36.3 kg (80 lb), SpO2 100 %, unknown if currently breastfeeding.Body mass index is 14.17 kg/m.  General Appearance: Casual and Fairly Groomed  Eye Contact:  Good  Speech:  Clear and Coherent and Normal Rate  Volume:  Decreased  Mood:  Depressed  Affect:  Congruent, Constricted and Depressed  Thought Process:  Coherent and Goal Directed  Orientation:  Full (Time, Place, and Person)   Thought Content:  Hallucinations: Auditory  Suicidal Thoughts:  Yes.  without intent/plan  Homicidal Thoughts:  No  Memory:  Immediate;   Fair Recent;   Fair Remote;   Fair  Judgement:  Fair  Insight:  Lacking  Psychomotor Activity:  Normal  Concentration:  Concentration: Fair  Recall:  [employer] of Knowledge:  Fair  Language:  Fair  Akathisia:  No  Handed:    AIMS (if indicated):     Assets:  Communication Skills Resilience Social Support  ADL's:  Intact  Cognition:  WNL  Sleep:  Number of Hours: 6.5   Treatment Plan Summary: Daily contact with patient to assess and evaluate symptoms and progress in treatment and Medication management   -Continue inpatient hospitalization  -Bipolar I, current episode depressed   - Continue olanzapine 16m po qhs   - Continue remeron 124mpo qhs  - Anxiety   - Continue atarax 5058mo q6h prn anxiety  - Anemia    - Continue ferrous sulfate 325m58m qAM with breakfast  -Poor oral intake   - Continue Ensure 237mL57m between meals  -GERD   - Continue famotidine 20mg 49mID  - Insomnia   - Continue trazodone 50mg p17ms prn insomnia  - Nausea   - Continue zofran 4mg po 32m prn nausea  - Chronic back pain   - Continue home medication of Vicodin 5-325mg 1 t26mt q6h moderate pain  -Encourage participation in groups and therapeutic milieu  -Disposition planning will be ongoing  ChristophPennelope Bracken/2019, 9:25 AM

## 2017-07-14 NOTE — Tx Team (Signed)
Interdisciplinary Treatment and Diagnostic Plan Update  07/14/2017 Time of Session: 9:33 AM  Judy Graham MRN: [id number]  Principal Diagnosis: Bipolar I disorder, most recent episode (or current) depressed (HCC)  Secondary Diagnoses: Principal Problem:   Bipolar I disorder, most recent episode (or current) depressed (HCC)   Current Medications:  Current Facility-Administered Medications  Medication Dose Route Frequency Provider Last Rate Last Dose  . acetaminophen (TYLENOL) tablet 650 mg  650 mg Oral Q6H PRN Rankin, Shuvon B, NP      . alum & mag hydroxide-simeth (MAALOX/MYLANTA) 200-200-20 MG/5ML suspension 30 mL  30 mL Oral Q4H PRN Rankin, Shuvon B, NP      . famotidine (PEPCID) tablet 20 mg  20 mg Oral BID Rankin, Shuvon B, NP   20 mg at 07/14/17 0819  . feeding supplement (ENSURE ENLIVE) (ENSURE ENLIVE) liquid 237 mL  237 mL Oral BID BM Micheal Likens, MD   237 mL at 07/14/17 0930  . ferrous sulfate tablet 325 mg  325 mg Oral Q breakfast Micheal Likens, MD   325 mg at 07/14/17 0818  . HYDROcodone-acetaminophen (NORCO/VICODIN) 5-325 MG per tablet 1 tablet  1 tablet Oral Q6H PRN Kerry Hough, PA-C   1 tablet at 07/14/17 4098  . hydrOXYzine (ATARAX/VISTARIL) tablet 50 mg  50 mg Oral Q6H PRN Micheal Likens, MD   50 mg at 07/13/17 1709  . ibuprofen (ADVIL,MOTRIN) tablet 600 mg  600 mg Oral Q6H PRN Kerry Hough, PA-C   600 mg at 07/12/17 2127  . magnesium hydroxide (MILK OF MAGNESIA) suspension 30 mL  30 mL Oral Daily PRN Rankin, Shuvon B, NP      . mirtazapine (REMERON) tablet 15 mg  15 mg Oral QHS Micheal Likens, MD   15 mg at 07/13/17 2103  . multivitamin with minerals tablet 1 tablet  1 tablet Oral Daily Nelly Rout, MD   1 tablet at 07/13/17 1208  . nicotine polacrilex (NICORETTE) gum 2 mg  2 mg Oral PRN Micheal Likens, MD      . OLANZapine (ZYPREXA) tablet 10 mg  10 mg Oral QHS Micheal Likens, MD   10 mg at  07/13/17 2103  . ondansetron (ZOFRAN) tablet 4 mg  4 mg Oral Q8H PRN Micheal Likens, MD   4 mg at 07/13/17 1709  . traZODone (DESYREL) tablet 50 mg  50 mg Oral QHS PRN Rankin, Shuvon B, NP   50 mg at 07/12/17 2127    PTA Medications: Medications Prior to Admission  Medication Sig Dispense Refill Last Dose  . butalbital-acetaminophen-caffeine (FIORICET, ESGIC) 50-325-40 MG tablet Take 1-2 tablets by mouth every 6 (six) hours as needed for migraine.   07/11/2017  . cycloSPORINE (RESTASIS) 0.05 % ophthalmic emulsion Place 1 drop into both eyes 2 (two) times daily.   07/12/2017  . diazepam (VALIUM) 5 MG tablet Take 1 tablet (5 mg total) by mouth daily as needed for anxiety. 1 tablet 0 07/11/2017  . famotidine (PEPCID) 20 MG tablet Take 1 tablet (20 mg total) by mouth 2 (two) times daily. For GERD 10 tablet 0 07/12/2017  . HYDROcodone-acetaminophen (NORCO/VICODIN) 5-325 MG tablet Take 1 tablet by mouth every 6 (six) hours as needed for moderate pain. 1 tablet 0 07/12/2017  . hydrOXYzine (ATARAX/VISTARIL) 25 MG tablet Take 1 tablet (25 mg total) by mouth 3 (three) times daily as needed (mild/moderate anxiety). 60 tablet 0 07/13/2017  . OLANZapine (ZYPREXA) 5 MG tablet Take 1 tablet (5 mg  total) by mouth at bedtime. For mood control 30 tablet 0 07/12/2017  . Pediatric Multivit-Minerals-C (FLINTSTONES GUMMIES PO) Take 1 tablet by mouth daily.   Past Week  . promethazine (PHENERGAN) 25 MG tablet Take 25 mg by mouth every 6 (six) hours as needed for nausea or vomiting.   07/11/2017  . traZODone (DESYREL) 50 MG tablet Take 1 tablet (50 mg) by mouth at bedtime: For sleep (Patient taking differently: Take 100 mg by mouth at bedtime as needed for sleep. ) 30 tablet 0 07/12/2017  . lidocaine (LIDODERM) 5 % Place 1 patch onto the skin daily as needed (For pain.). Remove & Discard patch within 12 hours or as directed by MD    not started yet    Treatment Modalities: Medication Management, Group therapy, Case  management,  1 to 1 session with clinician, Psychoeducation, Recreational therapy.  Patient Stressors: Marital or family conflict  Patient Strengths: Ability for insight Average or above average intelligence Capable of independent living General fund of knowledge Motivation for treatment/growth   Physician Treatment Plan for Primary Diagnosis: Bipolar I disorder, most recent episode (or current) depressed (HCC) Long Term Goal(s): Improvement in symptoms so as ready for discharge  Short Term Goals: Ability to demonstrate self-control will improve Ability to identify and develop effective coping behaviors will improve Ability to identify changes in lifestyle to reduce recurrence of condition will improve  Medication Management: Evaluate patient's response, side effects, and tolerance of medication regimen.  Therapeutic Interventions: 1 to 1 sessions, Unit Group sessions and Medication administration.  Evaluation of Outcomes: Progressing  Physician Treatment Plan for Secondary Diagnosis: Principal Problem:   Bipolar I disorder, most recent episode (or current) depressed (HCC)  Long Term Goal(s): Improvement in symptoms so as ready for discharge  Short Term Goals: Ability to demonstrate self-control will improve Ability to identify and develop effective coping behaviors will improve Ability to identify changes in lifestyle to reduce recurrence of condition will improve  Medication Management: Evaluate patient's response, side effects, and tolerance of medication regimen.  Therapeutic Interventions: 1 to 1 sessions, Unit Group sessions and Medication administration.  Evaluation of Outcomes: Progressing   RN Treatment Plan for Primary Diagnosis: Bipolar I disorder, most recent episode (or current) depressed (HCC) Long Term Goal(s): Knowledge of disease and therapeutic regimen to maintain health will improve  Short Term Goals: Ability to remain free from injury will improve,  Ability to participate in decision making will improve, Ability to disclose and discuss suicidal ideas, Ability to identify and develop effective coping behaviors will improve and Compliance with prescribed medications will improve  Medication Management: RN will administer medications as ordered by provider, will assess and evaluate patient's response and provide education to patient for prescribed medication. RN will report any adverse and/or side effects to prescribing provider.  Therapeutic Interventions: 1 on 1 counseling sessions, Psychoeducation, Medication administration, Evaluate responses to treatment, Monitor vital signs and CBGs as ordered, Perform/monitor CIWA, COWS, AIMS and Fall Risk screenings as ordered, Perform wound care treatments as ordered.  Evaluation of Outcomes: Progressing   LCSW Treatment Plan for Primary Diagnosis: Bipolar I disorder, most recent episode (or current) depressed (HCC) Long Term Goal(s): Safe transition to appropriate next level of care at discharge, Engage patient in therapeutic group addressing interpersonal concerns.  Short Term Goals: Engage patient in aftercare planning with referrals and resources, Increase social support, Facilitate acceptance of mental health diagnosis and concerns, Identify triggers associated with mental health/substance abuse issues and Increase skills for wellness and  recovery  Therapeutic Interventions: Assess for all discharge needs, 1 to 1 time with Child psychotherapist, Explore available resources and support systems, Assess for adequacy in community support network, Educate family and significant other(s) on suicide prevention, Complete Psychosocial Assessment, Interpersonal group therapy.  Evaluation of Outcomes: Progressing   Progress in Treatment: Attending groups: Yes Participating in groups: Yes Taking medication as prescribed: Yes Toleration of medication: Yes, no side effects reported at this time Family/Significant  other contact made: Karlena, [place] [phone] (Father)  Patient understands diagnosis: Yes AEB asking for help with depression and SI  Discussing patient identified problems/goals with staff: Yes Medical problems stabilized or resolved: Yes Denies suicidal/homicidal ideation: Yes Issues/concerns per patient self-inventory: None Other: N/A  New problem(s) identified: None identified at this time.   New Short Term/Long Term Goal(s): "To stop having suicidal thoughts and find somewhere to go".   Discharge Plan or Barriers:  Upon discharge pt will go to a local shelter and follow up with outpatient services in the area.   Reason for Continuation of Hospitalization:  Depression Medication stabilization Suicidal ideation   Estimated Length of Stay: 07/12/17  Attendees: Patient: Judy Graham  07/14/2017  9:33 AM  Physician: Jolyne Loa, MD 07/14/2017  9:33 AM  Nursing: Estella Husk, RN 07/14/2017  9:33 AM  RN Care Manager: Onnie Boer, RN 07/14/2017  9:33 AM  Social Worker: Richelle Ito, LCSW; Melba Coon, Social Work Intern 07/14/2017  9:33 AM  Recreational Therapist: Caroll Rancher, LRT 07/14/2017  9:33 AM  Other: Tomasita Morrow, P4CC 07/14/2017  9:33 AM  Other:  07/14/2017  9:33 AM  Other: 07/14/2017  9:33 AM    Scribe for Treatment Team: Aram Beecham, Student-Social Work 07/14/2017 9:33 AM

## 2017-07-14 NOTE — Progress Notes (Signed)
Adult Psychoeducational Group Note  Date:  07/14/2017 Time:  8:30 PM  Group Topic/Focus:  Wrap-Up Group:   The focus of this group is to help patients review their daily goal of treatment and discuss progress on daily workbooks.  Participation Level:  Active  Participation Quality:  Appropriate  Affect:  Appropriate  Cognitive:  Appropriate  Insight: Appropriate  Engagement in Group:  Engaged  Modes of Intervention:  Discussion  Additional Comments: The patient expressed that she rates today a 5 because of medication changes.The patient also said that she enjoyed Hope groups.  Octavio Mannshigpen, Ilanna Deihl Lee 07/14/2017, 8:30 PM

## 2017-07-14 NOTE — [facility] Group Notes (Signed)
[facility] LCSW Group Therapy  07/14/2017  1:05 PM  Type of Therapy:  Group therapy  Participation Level:  Invited.  Chose to not attend.  Participation Quality:  Attentive  Affect:  Flat  Cognitive:  Oriented  Insight:  Limited  Engagement in Therapy:  Limited  Modes of Intervention:  Discussion, Socialization  Summary of Progress/Problems:  Chaplain was here to lead a group on themes of hope and courage.  Daryel Geraldorth, Cleaster Shiffer B 07/14/2017 1:29 PM

## 2017-07-14 NOTE — Progress Notes (Signed)
D:  Judy Graham was in her room much of the morning.  She didn't attend groups even after much encouragement and was encouraged to attend at least one group today.  She did finally get up and go outside x 2 for recreational time.  She stated she had suicidal ideation earlier in the morning but currently denies and will contract for safety on the unit.  She denies A/V hallucinations.  She reported that she has been very anxious and depressed today.  She went to lunch today but stated that she didn't eat much because the food was terrible.  She has been taking her ensures as ordered.  She continues to report pain, and hydrocodone was given with minimal relief.  She completed her self inventory and reported that her depression and hopelessness are 6 out of 10 and her anxiety is 8 out of 10.  She stated her goal for today was "plan for home and coping skills" and she will accomplish this goal by "be present to all groups, talk to docs."  She was able to provide a urine sample today.   A:  1:1 interaction for support and encouragement.  Medications as ordered.  Q 15 minute checks maintained for safety.  Encouraged participation in group and unit activities. R:  Judy Graham remains safe on the unit.  We will continue to monitor the progress towards her goals.

## 2017-07-15 MED ORDER — BUTALBITAL-APAP-CAFFEINE 50-325-40 MG PO TABS
1.0000 | ORAL_TABLET | Freq: Once | ORAL | Status: AC
  Administered 2017-07-15: 1 via ORAL
  Filled 2017-07-15: qty 1

## 2017-07-15 MED ORDER — LORAZEPAM 1 MG PO TABS
1.0000 mg | ORAL_TABLET | Freq: Four times a day (QID) | ORAL | Status: DC | PRN
  Administered 2017-07-15 – 2017-07-17 (×5): 1 mg via ORAL
  Filled 2017-07-15 (×4): qty 1

## 2017-07-15 MED ORDER — OLANZAPINE 5 MG PO TBDP
5.0000 mg | ORAL_TABLET | Freq: Once | ORAL | Status: AC
  Administered 2017-07-15: 5 mg via ORAL

## 2017-07-15 MED ORDER — OLANZAPINE 5 MG PO TBDP
ORAL_TABLET | ORAL | Status: AC
  Filled 2017-07-15: qty 1

## 2017-07-15 MED ORDER — OLANZAPINE 5 MG PO TBDP: 5.0000 mg | ORAL_TABLET | Freq: Every day | ORAL | Status: DC

## 2017-07-15 MED ORDER — LORAZEPAM 1 MG PO TABS
ORAL_TABLET | ORAL | Status: AC
  Administered 2017-07-15: 1 mg via ORAL
  Filled 2017-07-15: qty 1

## 2017-07-15 NOTE — Progress Notes (Signed)
Writer has observed patient lying in bed asleep since shift change. She did not attend group nor get up to get her medications at bedtime. Writer took medications to her room, awakened her and asked if she was in any pain and she reported that she just needed sleep. She was offered a snack and she requested gold fish.It was reported by day shift nurse that patient had gotten upset in the afternoon after getting off the phone and was throwing crayons in the dayroom. She was medicated after this incident and has been asleep since then. Safety maintained on unit with 15 min checks.

## 2017-07-15 NOTE — Progress Notes (Signed)
[facility] MD Progress Note  07/15/2017 1:57 PM Rodney Cruiseicole Blacksher  MRN:  [id number]   Subjective:  Joni Reiningicole reports "I am doing better, I guess, I just have a headache and I need my medication that starts with a F."  Objective: Patient is awake alert and oriented x3.  Denies suicidal homicidal ideations during this assessment.  Patient reports she is taking her medications as prescribed and tolerating them well.  Roughly about 1330 patient received a phone call become agitated after the phone call.  Reports she was upset because she wasn't  unable to speak to her children.  Patient is requesting Valium for anxiety. Patient reports suicidal ideations after the phone call. Patient to be placed on 1:1 for safely.  Reports  ibuprofen, imtrex or Excedrin has never helped her symptoms. (report heart arrhythmias and states she hasn't followed up with cardiologist because she was able to afford follow up visits)   Discussed with patient Fioricet is a one time order, due to Narco PRN on chart. Support, encouragement and reassurances was provided.   History:Tagen Hamed is a 27 y/o F with history of Bipolar I who was admitted voluntarily with worsening symptoms of depression, restrictive eating pattern, AH, and SI with multiple plans in the context of discovering that her husband has been cheating on her. [facility]. She reported that she has been adherent to her outpatient medication regimen. She was restarted on previous medication of olanzapine at higher dose than previous discharge, and she was also started on trial of remeron for mood symptoms and to stimulate appetite.    Principal Problem: Bipolar I disorder, most recent episode (or current) depressed (HCC) Diagnosis:   Patient Active Problem List   Diagnosis Date Noted  . Bipolar I disorder, most recent episode (or current) depressed (HCC) [F31.30] 07/12/2017  . Bipolar disorder (HCC) [F31.9]  07/02/2017  . Suicidal overdose (HCC) [T50.902A] 07/01/2017  . Depression [F32.9]   . Tricyclic overdose, intentional self-harm, initial encounter (HCC) [T43.012A] 06/25/2017  . Acute respiratory failure (HCC) [J96.00] 06/25/2017  . Chronic hepatitis C without hepatic coma (HCC) [B18.2] 04/28/2017  . 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 Abdominal pain [R10.9] 07/01/2016  . Chronic headache [R51] 06/17/2016  . Bipolar and related disorder (HCC) [F31.9] 06/05/2015  . IUGR (intrauterine growth restriction) affecting care of mother [O36.5990] 08/14/2014  . Cigarette smoker [F17.210]   . MRSA (methicillin resistant staph aureus) culture positive [Z22.322] 05/26/2014  . Abnormal findings on antenatal screening [O28.9]   . Elevated AFP [R77.2]   . Choroid plexus cyst of fetus [O35.0XX0]   . Abnormal MSAFP (maternal serum alpha-fetoprotein), elevated [O28.0]   . High risk pregnancy with high inhibin [O28.8, O09.899]   . Choroid plexus cyst [G93.0]   . UTI (urinary tract infection) in pregnancy in second trimester [O23.42] 03/08/2014  . Marijuana use [F12.90] 03/08/2014  . Abnormal quad screen [O28.0] 03/08/2014  . Late prenatal care affecting pregnancy in second trimester, antepartum [O09.32] 03/04/2014  . Scoliosis [M41.9] 03/04/2014  . Anxiety disorder [F41.9] 09/05/2013  . Adjustment disorder with mixed emotional features [F43.29] 09/01/2013  . S/P spinal fusion [Z98.1] 08/29/2011   Total Time spent with patient: 30 minutes  Past Psychiatric History: see H&P  Past Medical History:  Past Medical History:  Diagnosis Date  . Headache   . Scoliosis   . Seizures (HCC)     Past Surgical History:  Procedure  Laterality Date  . BACK SURGERY    . KNEE SURGERY     Family History:  Family History  Problem Relation Age of Onset  . Cancer Mother   . Cancer Maternal Grandmother   . Diabetes Paternal Grandmother   . Stroke Paternal  Grandmother   . Hypertension Father    Family Psychiatric  History: See H&P Social History:  Social History   Substance and Sexual Activity  Alcohol Use Yes   Comment: occ     Social History   Substance and Sexual Activity  Drug Use No    Social History   Socioeconomic History  . Marital status: Single    Spouse name: Not on file  . Number of children: Not on file  . Years of education: Not on file  . Highest education level: Not on file  Occupational History  . Not on file  Social Needs  . Financial resource strain: Not on file  . Food insecurity:    Worry: Not on file    Inability: Not on file  . Transportation needs:    Medical: Not on file    Non-medical: Not on file  Tobacco Use  . Smoking status: Current Every Day Smoker    Packs/day: 0.50    Types: Cigarettes  . Smokeless tobacco: Never Used  Substance and Sexual Activity  . Alcohol use: Yes    Comment: occ  . Drug use: No  . Sexual activity: Yes    Birth control/protection: None  Lifestyle  . Physical activity:    Days per week: Not on file    Minutes per session: Not on file  . Stress: Not on file  Relationships  . Social connections:    Talks on phone: Not on file    Gets together: Not on file    Attends religious service: Not on file    Active member of club or organization: Not on file    Attends meetings of clubs or organizations: Not on file    Relationship status: Not on file  Other Topics Concern  . Not on file  Social History Narrative  . Not on file   Additional Social History:    Pain Medications: See MAR Prescriptions: See MAR Over the Counter: See MAR History of alcohol / drug use?: No history of alcohol / drug abuse Longest period of sobriety (when/how long): NA                    Sleep: Good  Appetite:  Fair  Current Medications: Current Facility-Administered Medications  Medication Dose Route Frequency Provider Last Rate Last Dose  . acetaminophen (TYLENOL)  tablet 650 mg  650 mg Oral Q6H PRN Rankin, Shuvon B, NP      . alum & mag hydroxide-simeth (MAALOX/MYLANTA) 200-200-20 MG/5ML suspension 30 mL  30 mL Oral Q4H PRN Rankin, Shuvon B, NP      . famotidine (PEPCID) tablet 20 mg  20 mg Oral BID Rankin, Shuvon B, NP   20 mg at 07/15/17 0841  . feeding supplement (ENSURE ENLIVE) (ENSURE ENLIVE) liquid 237 mL  237 mL Oral BID BM Micheal Likens, MD   237 mL at 07/15/17 0842  . ferrous sulfate tablet 325 mg  325 mg Oral Q breakfast Micheal Likens, MD   325 mg at 07/15/17 0841  . HYDROcodone-acetaminophen (NORCO/VICODIN) 5-325 MG per tablet 1 tablet  1 tablet Oral Q6H PRN Kerry Hough, PA-C   1 tablet at 07/15/17  1610  . hydrOXYzine (ATARAX/VISTARIL) tablet 50 mg  50 mg Oral Q6H PRN Micheal Likens, MD   50 mg at 07/15/17 0841  . ibuprofen (ADVIL,MOTRIN) tablet 600 mg  600 mg Oral Q6H PRN Kerry Hough, PA-C   600 mg at 07/12/17 2127  . Influenza vac split quadrivalent PF (FLUARIX) injection 0.5 mL  0.5 mL Intramuscular Tomorrow-1000 [place], Christopher T, MD      . magnesium hydroxide (MILK OF MAGNESIA) suspension 30 mL  30 mL Oral Daily PRN Rankin, Shuvon B, NP      . mirtazapine (REMERON) tablet 15 mg  15 mg Oral QHS Micheal Likens, MD   15 mg at 07/14/17 2112  . multivitamin with minerals tablet 1 tablet  1 tablet Oral Daily Nelly Rout, MD   1 tablet at 07/13/17 1208  . nicotine polacrilex (NICORETTE) gum 2 mg  2 mg Oral PRN Micheal Likens, MD   2 mg at 07/14/17 1818  . OLANZapine (ZYPREXA) tablet 10 mg  10 mg Oral QHS Micheal Likens, MD   10 mg at 07/14/17 2112  . OLANZapine zydis (ZYPREXA) disintegrating tablet 5 mg  5 mg Oral QHS Oneta Rack, NP      . ondansetron (ZOFRAN) tablet 4 mg  4 mg Oral Q8H PRN Micheal Likens, MD   4 mg at 07/14/17 1037  . traZODone (DESYREL) tablet 50 mg  50 mg Oral QHS PRN Rankin, Shuvon B, NP   50 mg at 07/14/17 2112    Lab Results:   Results for orders placed or performed during the hospital encounter of 07/12/17 (from the past 48 hour(s))  Urinalysis, Complete w Microscopic     Status: Abnormal   Collection Time: 07/14/17  2:42 PM  Result Value Ref Range   Color, Urine YELLOW YELLOW   APPearance CLEAR CLEAR   Specific Gravity, Urine 1.004 (L) 1.005 - 1.030   pH 6.0 5.0 - 8.0   Glucose, UA NEGATIVE NEGATIVE mg/dL   Hgb urine dipstick SMALL (A) NEGATIVE   Bilirubin Urine NEGATIVE NEGATIVE   Ketones, ur NEGATIVE NEGATIVE mg/dL   Protein, ur NEGATIVE NEGATIVE mg/dL   Nitrite NEGATIVE NEGATIVE   Leukocytes, UA MODERATE (A) NEGATIVE   RBC / HPF TOO NUMEROUS TO COUNT 0 - 5 RBC/hpf   WBC, UA 6-30 0 - 5 WBC/hpf   Bacteria, UA RARE (A) NONE SEEN   Squamous Epithelial / LPF 6-30 (A) NONE SEEN    Comment: Performed at [facility], [address], [place], [place] [address]  Pregnancy, urine     Status: None   Collection Time: 07/14/17  2:42 PM  Result Value Ref Range   Preg Test, Ur NEGATIVE NEGATIVE    Comment: Performed at [facility], [address], [place], [place] [address]  Urine rapid drug screen (hosp performed)not at [facility]     Status: Abnormal   Collection Time: 07/14/17  2:42 PM  Result Value Ref Range   Opiates POSITIVE (A) NONE DETECTED   Cocaine NONE DETECTED NONE DETECTED   Benzodiazepines NONE DETECTED NONE DETECTED   Amphetamines NONE DETECTED NONE DETECTED   Tetrahydrocannabinol NONE DETECTED NONE DETECTED   Barbiturates NONE DETECTED NONE DETECTED    Comment: (NOTE) DRUG SCREEN FOR MEDICAL PURPOSES ONLY.  IF CONFIRMATION IS NEEDED FOR ANY PURPOSE, NOTIFY LAB WITHIN 5 DAYS. LOWEST DETECTABLE LIMITS FOR URINE DRUG SCREEN Drug Class  Cutoff (ng/mL) Amphetamine and metabolites    1000 Barbiturate and metabolites    200 Benzodiazepine                 200 Tricyclics and metabolites     300 Opiates and metabolites        300 Cocaine and  metabolites        300 THC                            50 Performed at [facility], [address] [place], [place] [address]     Blood Alcohol level:  Lab Results  Component Value Date   ETH <10 07/13/2017   ETH <10 06/25/2017    Metabolic Disorder Labs: Lab Results  Component Value Date   HGBA1C 5.1 07/13/2017   MPG 99.67 07/13/2017   Lab Results  Component Value Date   PROLACTIN 56.2 (H) 07/13/2017   Lab Results  Component Value Date   CHOL 167 07/13/2017   TRIG 59 07/13/2017   HDL 73 07/13/2017   CHOLHDL 2.3 07/13/2017   VLDL 12 07/13/2017   LDLCALC 82 07/13/2017    Physical Findings: AIMS: Facial and Oral Movements Muscles of Facial Expression: None, normal Lips and Perioral Area: None, normal Jaw: None, normal Tongue: None, normal,Extremity Movements Upper (arms, wrists, hands, fingers): None, normal Lower (legs, knees, ankles, toes): None, normal, Trunk Movements Neck, shoulders, hips: None, normal, Overall Severity Severity of abnormal movements (highest score from questions above): None, normal Incapacitation due to abnormal movements: None, normal Patient's awareness of abnormal movements (rate only patient's report): No Awareness, Dental Status Current problems with teeth and/or dentures?: No Does patient usually wear dentures?: No  CIWA:    COWS:     Musculoskeletal: Strength & Muscle Tone: within normal limits Gait & Station: normal Patient leans: N/A  Psychiatric Specialty Exam: Physical Exam  Nursing note and vitals reviewed. Constitutional: She appears well-developed.  Neurological: She is alert.  Psychiatric: She has a normal mood and affect. Her behavior is normal.    Review of Systems  Psychiatric/Behavioral: Positive for depression, hallucinations and suicidal ideas. The patient is nervous/anxious. The patient does not have insomnia.   All other systems reviewed and are negative.   Blood pressure 115/79, pulse  100, temperature 98.5 F (36.9 C), temperature source Oral, resp. rate 16, height 5\' 3"  (1.6 m), weight 36.3 kg (80 lb), SpO2 100 %, unknown if currently breastfeeding.Body mass index is 14.17 kg/m.  General Appearance: Casual and Fairly Groomed  Eye Contact:  Good  Speech:  Clear and Coherent and Normal Rate  Volume:  Decreased  Mood:  Depressed  Affect:  Congruent, Constricted, Depressed and Labile  Thought Process:  Coherent and Goal Directed  Orientation:  Full (Time, Place, and Person)  Thought Content:  Hallucinations: Auditory  Suicidal Thoughts:  Yes.  without intent/plan  Homicidal Thoughts:  No  Memory:  Immediate;   Fair Recent;   Fair Remote;   Fair  Judgement:  Fair  Insight:  Lacking  Psychomotor Activity:  Normal  Concentration:  Concentration: Fair  Recall:  [employer] of Knowledge:  Fair  Language:  Fair  Akathisia:  No  Handed:    AIMS (if indicated):     Assets:  Communication Skills Resilience Social Support  ADL's:  Intact  Cognition:  WNL  Sleep:  Number of Hours: 6.75   Treatment Plan Summary: Daily contact with  patient to assess and evaluate symptoms and progress in treatment and Medication management   -Continue with current treatment plan on 07/15/2017 except where noted  -Bipolar I, current episode depressed   - Continue olanzapine 10mg  po qhs   - Continue remeron 15mg  po qhs  - Anxiety   - Continue atarax 50mg  po q6h prn anxiety  - Anemia    - Continue ferrous sulfate 325mg  po qAM with breakfast  -Poor oral intake   - Continue Ensure BID between meals  -GERD   - Continue famotidine 20mg  po BID  - Insomnia   - Continue trazodone 50mg  po qhs prn insomnia  - Nausea   - Continue zofran 4mg  po q8h prn nausea  - Chronic back pain   - Continue home medication of Vicodin 5-325mg  1 tablet q6h moderate pain  -Encourage participation in groups and therapeutic milieu  -Disposition planning will be ongoing  Oneta Rack,  NP 07/15/2017, 1:57 PM

## 2017-07-15 NOTE — Progress Notes (Signed)
Did not attend group 

## 2017-07-15 NOTE — [facility] Group Notes (Addendum)
[facility] Group Notes:  (Nursing/MHT/Case Management/Adjunct)  Date:  07/15/2017  Time:  9:19 AM  Type of Therapy:  Psychoeducational Skills  Participation Level:  Active  Participation Quality:  Appropriate  Affect:  Appropriate  Cognitive:  Appropriate  Insight:  Appropriate  Engagement in Group:  Engaged  Modes of Intervention:  Problem-solving  Summary of Progress/Problems: Pt attended Psychoeducational group with top topic anger management.   Jacquelyne BalintForrest, Tionne Carelli Shanta 07/15/2017, 9:19 AM

## 2017-07-15 NOTE — [facility] Group Notes (Addendum)
[facility] Group Notes:  (Nursing/MHT/Case Management/Adjunct)  Date:  07/15/2017  Time:  9:07 AM  Type of Therapy:  Goals/Orientation Group.  Participation Level:  Active  Participation Quality:  Appropriate  Affect:  Appropriate  Cognitive:  Appropriate  Insight:  Appropriate  Engagement in Group:  Engaged  Modes of Intervention:  Discussion  Summary of Progress/Problems: Pt attended goals/orientation group, pt was receptive.  Jacquelyne BalintForrest, Johaan Ryser Shanta 07/15/2017, 9:07 AM

## 2017-07-15 NOTE — [facility] Group Notes (Signed)
  [facility] LCSW Group Therapy Note  Date/Time:  07/15/2017 11:15AM-12:00PM  Type of Therapy and Topic:  Group Therapy:  Feelings About Hospitalization  Participation Level:  Active   Description of Group This process group involved patients discussing their feelings related to being hospitalized, as well as the benefits they see to being in the hospital.  These feelings and benefits were itemized.  The group then brainstormed specific ways in which they could seek those same benefits when they discharge and return home.  Therapeutic Goals 1. Patient will identify and describe positive and negative feelings related to hospitalization 2. Patient will verbalize benefits of hospitalization to themselves personally 3. Patients will brainstorm together ways they can obtain similar benefits in the outpatient setting, identify barriers to wellness and possible solutions  Summary of Patient Progress:  The patient expressed her primary feelings about being hospitalized are "glad" because it is providing her with stability especially with regard to getting her medications on time.  She stated that outside the hospital she tends to get busy and forget to take her medicine.  She was able to acknowledge the importance of medicine in keeping her well, and said she plans to ask her father to remind her when he takes his meds to take her own.  She said the hospital is boring sometimes but she is happy to get the help.  Therapeutic Modalities Cognitive Behavioral Therapy Motivational Interviewing    Ambrose MantleMareida Grossman-Orr, LCSW 07/15/2017, 1:14 PM

## 2017-07-15 NOTE — Progress Notes (Signed)
Patient ID: Judy Graham, female   DOB: 1991-03-16, 27 y.o.   MRN: [id number]    D: Pt has been very flat and depressed on the unit today. Pt has also remained very tearful, she reported that she could not talk to her children. Pt reported that her boyfriend would not let her. Pt became upset on the unit, and started throwing crayons. Tanika NP was made aware of situation, new orders noted. Pt has consistently requested medication all day on the unit, seems to be very med seeking. Pt reported that her depression was a 5, her anxiety was a 6, and her hopelessness was a 5. Pt reported that her goal for today was to find coping skills stress. Pt reported that she was positive SI, but could contract for safety. Pt reported being negative HI, no AH/VH noted. A: 15 min checks continued for patient safety. R: Pt safety maintained.

## 2017-07-16 MED ORDER — TRAZODONE HCL 50 MG PO TABS
50.0000 mg | ORAL_TABLET | Freq: Once | ORAL | Status: AC
  Administered 2017-07-16: 50 mg via ORAL

## 2017-07-16 NOTE — [facility] Group Notes (Signed)
[facility]  Date/Time:  07/16/2017  11:00AM-12:00PM  Type of Therapy and Topic:  Group Therapy:  Music and Mood  Participation Level:  Active   Description of Group: In this process group, members listened to a variety of genres of music and identified that different types of music evoke different responses.  Patients were encouraged to identify music that was soothing for them and music that was energizing for them.  Patients discussed how this knowledge can help with wellness and recovery in various ways including managing depression and anxiety as well as encouraging healthy sleep habits.    Therapeutic Goals: 1. Patients will explore the impact of different varieties of music on mood 2. Patients will verbalize the thoughts they have when listening to different types of music 3. Patients will identify music that is soothing to them as well as music that is energizing to them 4. Patients will discuss how to use this knowledge to assist in maintaining wellness and recovery 5. Patients will explore the use of music as a coping skill  Summary of Patient Progress:  At the beginning of group, patient expressed that she felt peaceful and a little tired.  She stated at the end of group that music is a significant coping skill for her, and she was still tired, but "it helped a lot."  Therapeutic Modalities: Solution Focused Brief Therapy Activity   Ambrose MantleMareida Grossman-Orr, LCSW

## 2017-07-16 NOTE — Progress Notes (Signed)
D:  Patient's self inventory sheet, patient has fair sleep, no sleep medication given.  Good appetite, low energy level, good concentration.  Rated depression and anxiety 5, hopeless 6.  Denied withdrawals.  Denied SI.  Physical problems, lightheaded, pain, headaches.  Physical pain, worst pain in past 24 hours is #7, back and head.  Pain medication helpful.  Goal is discharge.  Does have discharge plans.  Plans to attend groups. A:  Medications administered per MD orders.  Emotional support and encouragement given patient. R:  Denied SI and HI, contracts for safety  Denied A/V hallucinations.  Safety maintained with 15 minute checks.

## 2017-07-16 NOTE — Plan of Care (Signed)
Nurse discussed depression, anxiety, coping skills with patient.  

## 2017-07-16 NOTE — [facility] Group Notes (Signed)
[facility] Group Notes:  (Nursing/MHT/Case Management/Adjunct)  Date:  07/16/2017  Time:  4:05 PM  Type of Therapy:  Psychoeducational Skills  Participation Level:  Active  Participation Quality:  Appropriate  Affect:  Appropriate  Cognitive:  Appropriate  Insight:  Appropriate  Engagement in Group:  Engaged  Modes of Intervention:  Problem-solving  Summary of Progress/Problems: Pt attended Psychoeducational group with top topic healthy support systems.   Jacquelyne BalintForrest, Amiliana Foutz Shanta 07/16/2017, 4:05 PM

## 2017-07-16 NOTE — Progress Notes (Signed)
Patient's husband came to visit her tonight.  Message was given to nurse that husband wanted to use her card to get money to use.  Nurse went to 500 hall, patient and husband standing in hallway.  Nurse had been told that patient's dad would be coming to get money.  But patient's husband came.  Nurse told patient and her husband that we don't normally get cards out of the locker for family members to use.  Patient's husband left in a rush, using cuss words.  Patient stated she and her husband had not been living together.  That patient had called her husband earlier today and husband would not let her children talk to her.  Patient stated her husband told her that he did not come to see her, that he only came to get the money off her card.  Patient sitting in dayroom, watching television.  Patient stated she did not know why her husband acts this way. AC, charge nurse informed of patient's husband's behavior.

## 2017-07-16 NOTE — Progress Notes (Signed)
[facility]  07/16/2017 1:52 PM Judy Graham  MRN:  [id number]   Subjective:  Judy Graham reports "I am doing okay just in a lot of pain this morning."  Objective: Judy Graham seen sitting in day room interacting with peers.  Presents flat guarded at this morning.  Reports her depression is a 4 out of 10.  Denies that she is homicidal or suicidal during this assessment.  Denies auditory or visual hallucinations.  Reports she is taking her medications as prescribed and tolerating them well.  Patient seen attending group session with active participation.  Reports she slept okay last night.  Patient reports she is going back and forth between discharging.  Reports some days is better than others and she really misses her children however she wants to be  "clear headed before discharging." Support, encouragement and reassurances was provided.   History:Judy Graham is a 27 y/o F with history of Bipolar I who was admitted voluntarily with worsening symptoms of depression, restrictive eating pattern, AH, and SI with multiple plans in the context of discovering that her husband has been cheating on her. Pt [facility]. She reported that she has been adherent to her outpatient medication regimen. She was restarted on previous medication of olanzapine at higher dose than previous discharge, and she was also started on trial of remeron for mood symptoms and to stimulate appetite.    Principal Problem: Bipolar I disorder, most recent episode (or current) depressed (HCC) Diagnosis:   Patient Active Problem List   Diagnosis Date Noted  . Bipolar I disorder, most recent episode (or current) depressed (HCC) [F31.30] 07/12/2017  . Bipolar disorder (HCC) [F31.9] 07/02/2017  . Suicidal overdose (HCC) [T50.902A] 07/01/2017  . Depression [F32.9]   . Tricyclic overdose, intentional self-harm, initial encounter (HCC) [T43.012A] 06/25/2017  . Acute  respiratory failure (HCC) [J96.00] 06/25/2017  . Chronic hepatitis C without hepatic coma (HCC) [B18.2] 04/28/2017  . 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 Abdominal pain [R10.9] 07/01/2016  . Chronic headache [R51] 06/17/2016  . Bipolar and related disorder (HCC) [F31.9] 06/05/2015  . IUGR (intrauterine growth restriction) affecting care of mother [O36.5990] 08/14/2014  . Cigarette smoker [F17.210]   . MRSA (methicillin resistant staph aureus) culture positive [Z22.322] 05/26/2014  . Abnormal findings on antenatal screening [O28.9]   . Elevated AFP [R77.2]   . Choroid plexus cyst of fetus [O35.0XX0]   . Abnormal MSAFP (maternal serum alpha-fetoprotein), elevated [O28.0]   . High risk pregnancy with high inhibin [O28.8, O09.899]   . Choroid plexus cyst [G93.0]   . UTI (urinary tract infection) in pregnancy in second trimester [O23.42] 03/08/2014  . Marijuana use [F12.90] 03/08/2014  . Abnormal quad screen [O28.0] 03/08/2014  . Late prenatal care affecting pregnancy in second trimester, antepartum [O09.32] 03/04/2014  . Scoliosis [M41.9] 03/04/2014  . Anxiety disorder [F41.9] 09/05/2013  . Adjustment disorder with mixed emotional features [F43.29] 09/01/2013  . S/P spinal fusion [Z98.1] 08/29/2011   Total Time spent with patient: 30 minutes  Past Psychiatric History: see H&P  Past Medical History:  Past Medical History:  Diagnosis Date  . Headache   . Scoliosis   . Seizures (HCC)     Past Surgical History:  Procedure Laterality Date  . BACK SURGERY    . KNEE SURGERY     Family History:  Family History  Problem Relation Age of Onset  . Cancer Mother   .  Cancer Maternal Grandmother   . Diabetes Paternal Grandmother   . Stroke Paternal Grandmother   . Hypertension Father    Family Psychiatric  History: See H&P Social History:  Social History   Substance and Sexual Activity  Alcohol Use Yes   Comment: occ      Social History   Substance and Sexual Activity  Drug Use No    Social History   Socioeconomic History  . Marital status: Single    Spouse name: Not on file  . Number of children: Not on file  . Years of education: Not on file  . Highest education level: Not on file  Occupational History  . Not on file  Social Needs  . Financial resource strain: Not on file  . Food insecurity:    Worry: Not on file    Inability: Not on file  . Transportation needs:    Medical: Not on file    Non-medical: Not on file  Tobacco Use  . Smoking status: Current Every Day Smoker    Packs/day: 0.50    Types: Cigarettes  . Smokeless tobacco: Never Used  Substance and Sexual Activity  . Alcohol use: Yes    Comment: occ  . Drug use: No  . Sexual activity: Yes    Birth control/protection: None  Lifestyle  . Physical activity:    Days per week: Not on file    Minutes per session: Not on file  . Stress: Not on file  Relationships  . Social connections:    Talks on phone: Not on file    Gets together: Not on file    Attends religious service: Not on file    Active member of club or organization: Not on file    Attends meetings of clubs or organizations: Not on file    Relationship status: Not on file  Other Topics Concern  . Not on file  Social History Narrative  . Not on file   Additional Social History:    Pain Medications: See MAR Prescriptions: See MAR Over the Counter: See MAR History of alcohol / drug use?: No history of alcohol / drug abuse Longest period of sobriety (when/how long): NA                    Sleep: Good  Appetite:  Fair  Current Medications: Current Facility-Administered Medications  Medication Dose Route Frequency Provider Last Rate Last Dose  . acetaminophen (TYLENOL) tablet 650 mg  650 mg Oral Q6H PRN Rankin, Shuvon B, NP      . alum & mag hydroxide-simeth (MAALOX/MYLANTA) 200-200-20 MG/5ML suspension 30 mL  30 mL Oral Q4H PRN Rankin, Shuvon B, NP       . famotidine (PEPCID) tablet 20 mg  20 mg Oral BID Rankin, Shuvon B, NP   20 mg at 07/16/17 0844  . feeding supplement (ENSURE ENLIVE) (ENSURE ENLIVE) liquid 237 mL  237 mL Oral BID BM Micheal Likens, MD   237 mL at 07/16/17 0920  . ferrous sulfate tablet 325 mg  325 mg Oral Q breakfast Micheal Likens, MD   325 mg at 07/16/17 0844  . HYDROcodone-acetaminophen (NORCO/VICODIN) 5-325 MG per tablet 1 tablet  1 tablet Oral Q6H PRN Kerry Hough, PA-C   1 tablet at 07/16/17 0850  . hydrOXYzine (ATARAX/VISTARIL) tablet 50 mg  50 mg Oral Q6H PRN Micheal Likens, MD   50 mg at 07/15/17 1401  . ibuprofen (ADVIL,MOTRIN) tablet 600 mg  600  mg Oral Q6H PRN Kerry Hough, PA-C   600 mg at 07/12/17 2127  . LORazepam (ATIVAN) tablet 1 mg  1 mg Oral Q6H PRN Oneta Rack, NP   1 mg at 07/16/17 1256  . magnesium hydroxide (MILK OF MAGNESIA) suspension 30 mL  30 mL Oral Daily PRN Rankin, Shuvon B, NP      . mirtazapine (REMERON) tablet 15 mg  15 mg Oral QHS Micheal Likens, MD   15 mg at 07/15/17 2243  . multivitamin with minerals tablet 1 tablet  1 tablet Oral Daily Nelly Rout, MD   1 tablet at 07/13/17 1208  . nicotine polacrilex (NICORETTE) gum 2 mg  2 mg Oral PRN Micheal Likens, MD   2 mg at 07/16/17 1006  . OLANZapine (ZYPREXA) tablet 10 mg  10 mg Oral QHS Micheal Likens, MD   10 mg at 07/15/17 2243  . ondansetron (ZOFRAN) tablet 4 mg  4 mg Oral Q8H PRN Micheal Likens, MD   4 mg at 07/14/17 1037  . traZODone (DESYREL) tablet 50 mg  50 mg Oral QHS PRN Rankin, Shuvon B, NP   50 mg at 07/14/17 2112    Lab Results:  Results for orders placed or performed during the hospital encounter of 07/12/17 (from the past 48 hour(s))  Urinalysis, Complete w Microscopic     Status: Abnormal   Collection Time: 07/14/17  2:42 PM  Result Value Ref Range   Color, Urine YELLOW YELLOW   APPearance CLEAR CLEAR   Specific Gravity, Urine 1.004 (L)  1.005 - 1.030   pH 6.0 5.0 - 8.0   Glucose, UA NEGATIVE NEGATIVE mg/dL   Hgb urine dipstick SMALL (A) NEGATIVE   Bilirubin Urine NEGATIVE NEGATIVE   Ketones, ur NEGATIVE NEGATIVE mg/dL   Protein, ur NEGATIVE NEGATIVE mg/dL   Nitrite NEGATIVE NEGATIVE   Leukocytes, UA MODERATE (A) NEGATIVE   RBC / HPF TOO NUMEROUS TO COUNT 0 - 5 RBC/hpf   WBC, UA 6-30 0 - 5 WBC/hpf   Bacteria, UA RARE (A) NONE SEEN   Squamous Epithelial / LPF 6-30 (A) NONE SEEN    Comment: Performed at [facility], [address], [place], [place] [address]  Pregnancy, urine     Status: None   Collection Time: 07/14/17  2:42 PM  Result Value Ref Range   Preg Test, Ur NEGATIVE NEGATIVE    Comment: Performed at [facility], [address] [place], [place] [address]  Urine rapid drug screen (hosp performed)not at [facility]     Status: Abnormal   Collection Time: 07/14/17  2:42 PM  Result Value Ref Range   Opiates POSITIVE (A) NONE DETECTED   Cocaine NONE DETECTED NONE DETECTED   Benzodiazepines NONE DETECTED NONE DETECTED   Amphetamines NONE DETECTED NONE DETECTED   Tetrahydrocannabinol NONE DETECTED NONE DETECTED   Barbiturates NONE DETECTED NONE DETECTED    Comment: (NOTE) DRUG SCREEN FOR MEDICAL PURPOSES ONLY.  IF CONFIRMATION IS NEEDED FOR ANY PURPOSE, NOTIFY LAB WITHIN 5 DAYS. LOWEST DETECTABLE LIMITS FOR URINE DRUG SCREEN Drug Class                     Cutoff (ng/mL) Amphetamine and metabolites    1000 Barbiturate and metabolites    200 Benzodiazepine                 200 Tricyclics and metabolites     300 Opiates and metabolites  300 Cocaine and metabolites        300 THC                            50 Performed at [facility], [address] [place], [place] [address]     Blood Alcohol level:  Lab Results  Component Value Date   ETH <10 07/13/2017   ETH <10 06/25/2017    Metabolic Disorder Labs: Lab Results  Component Value Date    HGBA1C 5.1 07/13/2017   MPG 99.67 07/13/2017   Lab Results  Component Value Date   PROLACTIN 56.2 (H) 07/13/2017   Lab Results  Component Value Date   CHOL 167 07/13/2017   TRIG 59 07/13/2017   HDL 73 07/13/2017   CHOLHDL 2.3 07/13/2017   VLDL 12 07/13/2017   LDLCALC 82 07/13/2017    Physical Findings: AIMS: Facial and Oral Movements Muscles of Facial Expression: None, normal Lips and Perioral Area: None, normal Jaw: None, normal Tongue: None, normal,Extremity Movements Upper (arms, wrists, hands, fingers): None, normal Lower (legs, knees, ankles, toes): None, normal, Trunk Movements Neck, shoulders, hips: None, normal, Overall Severity Severity of abnormal movements (highest score from questions above): None, normal Incapacitation due to abnormal movements: None, normal Patient's awareness of abnormal movements (rate only patient's report): No Awareness, Dental Status Current problems with teeth and/or dentures?: No Does patient usually wear dentures?: No  CIWA:    COWS:     Musculoskeletal: Strength & Muscle Tone: within normal limits Gait & Station: normal Patient leans: N/A  Psychiatric Specialty Exam: Physical Exam  Nursing note and vitals reviewed. Constitutional: She appears well-developed.  Neurological: She is alert.  Psychiatric: She has a normal mood and affect. Her behavior is normal.    Review of Systems  Psychiatric/Behavioral: Positive for depression, hallucinations and suicidal ideas. The patient is nervous/anxious. The patient does not have insomnia.   All other systems reviewed and are negative.   Blood pressure 113/72, pulse (!) 106, temperature 97.7 F (36.5 C), temperature source Oral, resp. rate 18, height 5\' 3"  (1.6 m), weight 36.3 kg (80 lb), SpO2 100 %, unknown if currently breastfeeding.Body mass index is 14.17 kg/m.  General Appearance: Casual and Fairly Groomed  Eye Contact:  Good  Speech:  Clear and Coherent and Normal Rate   Volume:  Decreased  Mood:  Depressed  Affect:  Congruent, Constricted, Depressed and Labile  Thought Process:  Coherent and Goal Directed  Orientation:  Full (Time, Place, and Person)  Thought Content:  Hallucinations: Auditory  Suicidal Thoughts:  Yes.  without intent/plan  Homicidal Thoughts:  No  Memory:  Immediate;   Fair Recent;   Fair Remote;   Fair  Judgement:  Fair  Insight:  Lacking  Psychomotor Activity:  Normal  Concentration:  Concentration: Fair  Recall:  [employer] of Knowledge:  Fair  Language:  Fair  Akathisia:  No  Handed:    AIMS (if indicated):     Assets:  Communication Skills Resilience Social Support  ADL's:  Intact  Cognition:  WNL  Sleep:  Number of Hours: 6.75   Treatment Plan Summary: Daily contact with patient to assess and evaluate symptoms and progress in treatment and Medication management   -Continue with current treatment plan on 07/16/2017 except where noted  -Bipolar I, current episode depressed   - Continue olanzapine 10mg  po qhs   - Continue remeron 15mg  po qhs  - Anxiety   -  Continue atarax 50mg  po q6h prn anxiety  - Anemia    - Continue ferrous sulfate 325mg  po qAM with breakfast  -Poor oral intake   - Continue Ensure BID between meals  -GERD   - Continue famotidine 20mg  po BID  - Insomnia   - Continue trazodone 50mg  po qhs prn insomnia  - Nausea   - Continue zofran 4mg  po q8h prn nausea  - Chronic back pain   - Continue home medication of Vicodin 5-325mg  1 tablet q6h moderate pain  -Encourage participation in groups and therapeutic milieu -Disposition planning will be ongoing  Oneta Rack, NP 07/16/2017, 1:52 PM

## 2017-07-16 NOTE — [facility] Group Notes (Signed)
[facility] Group Notes:  (Nursing/MHT/Case Management/Adjunct)  Date:  07/16/2017  Time:  9:35 AM  Type of Therapy:  Goals/Orientation Group.  Participation Level:  Active  Participation Quality:  Appropriate  Affect:  Appropriate  Cognitive:  Appropriate  Insight:  Appropriate  Engagement in Group:  Engaged  Modes of Intervention:  Discussion  Summary of Progress/Problems: Pt attended goals/orientation group, pt was receptive.   Jacquelyne BalintForrest, Sary Bogie Shanta 07/16/2017, 9:35 AM

## 2017-07-17 MED ORDER — HYDROXYZINE HCL 50 MG PO TABS: 50.0000 mg | ORAL_TABLET | Freq: Four times a day (QID) | ORAL | 0 refills | Status: AC | PRN

## 2017-07-17 MED ORDER — FERROUS SULFATE 325 (65 FE) MG PO TABS: 325.0000 mg | ORAL_TABLET | Freq: Every day | ORAL | 3 refills | Status: AC

## 2017-07-17 MED ORDER — OLANZAPINE 10 MG PO TABS: 10.0000 mg | ORAL_TABLET | Freq: Every day | ORAL | 0 refills | Status: AC

## 2017-07-17 MED ORDER — NICOTINE POLACRILEX 2 MG MT GUM: 2.0000 mg | CHEWING_GUM | OROMUCOSAL | 0 refills | Status: AC | PRN

## 2017-07-17 MED ORDER — MIRTAZAPINE 15 MG PO TABS: 15.0000 mg | ORAL_TABLET | Freq: Every day | ORAL | 0 refills | Status: AC

## 2017-07-17 MED ORDER — TRAZODONE HCL 50 MG PO TABS: 50.0000 mg | ORAL_TABLET | Freq: Every evening | ORAL | 0 refills | Status: AC | PRN

## 2017-07-17 NOTE — Discharge Summary (Addendum)
Physician Discharge Summary Note  Patient:  Judy Graham is an 27 y.o., female MRN:  [id number] DOB:  [birth year] Patient phone:  [phone] (home)  Patient address:   [address] [place] [id number],  Total Time spent with patient: 20 minutes  Date of Admission:  07/12/2017 Date of Discharge: 07/17/2017  Reason for Admission: Per assessment note-  Judy Graham is a 27 y/o F with history of Bipolar I who was admitted voluntarily with worsening symptoms of depression and SI with multiple plans in the context of discovering that her husband has been cheating on her. Pt has recent relevant history of [facility] with discharge on 07/05/17. She reports that she has been adherent to her outpatient medication regimen.Upon initial evaluation, pt shares, "I walked in. I caught my husband sleeping with my best friend and I started having flashbacks. I had suicidal thoughts." Pt shares that she thought about multiple plans, but she did not have specific intent to follow through, and [facility]. She denies HI. She reports AH of "static." She denies VH. She reports depressive symptoms of depressed mood, anhedonia, guilty feelings, low energy, poor concentration, and poor appetite. She is significantly underweight, and she endorses restricting herself from food but she denies purging behaviors. She denies negative self image and instead endorses poor appetite. She denies symptoms of mania, OCD, and PTSD. She denies recent illicit substance use.    Principal Problem: Bipolar I disorder, most recent episode (or current) depressed [facility](HCC) Discharge Diagnoses: Patient Active Problem List   Diagnosis Date Noted  . Bipolar I disorder, most recent episode (or current) depressed (HCC) [F31.30] 07/12/2017  . Bipolar disorder (HCC) [F31.9] 07/02/2017  . Suicidal overdose (HCC) [T50.902A] 07/01/2017  . Depression [F32.9]   . Tricyclic overdose, intentional  self-harm, initial encounter (HCC) [T43.012A] 06/25/2017  . Acute respiratory failure (HCC) [J96.00] 06/25/2017  . Chronic hepatitis C without hepatic coma (HCC) [B18.2] 04/28/2017  . 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 Abdominal pain [R10.9] 07/01/2016  . Chronic headache [R51] 06/17/2016  . Bipolar and related disorder (HCC) [F31.9] 06/05/2015  . IUGR (intrauterine growth restriction) affecting care of mother [O36.5990] 08/14/2014  . Cigarette smoker [F17.210]   . MRSA (methicillin resistant staph aureus) culture positive [Z22.322] 05/26/2014  . Abnormal findings on antenatal screening [O28.9]   . Elevated AFP [R77.2]   . Choroid plexus cyst of fetus [O35.0XX0]   . Abnormal MSAFP (maternal serum alpha-fetoprotein), elevated [O28.0]   . High risk pregnancy with high inhibin [O28.8, O09.899]   . Choroid plexus cyst [G93.0]   . UTI (urinary tract infection) in pregnancy in second trimester [O23.42] 03/08/2014  . Marijuana use [F12.90] 03/08/2014  . Abnormal quad screen [O28.0] 03/08/2014  . Late prenatal care affecting pregnancy in second trimester, antepartum [O09.32] 03/04/2014  . Scoliosis [M41.9] 03/04/2014  . Anxiety disorder [F41.9] 09/05/2013  . Adjustment disorder with mixed emotional features [F43.29] 09/01/2013  . S/P spinal fusion [Z98.1] 08/29/2011    Past Psychiatric History:  Past Medical History:  Past Medical History:  Diagnosis Date  . Headache   . Scoliosis   . Seizures (HCC)     Past Surgical History:  Procedure Laterality Date  . BACK SURGERY    . KNEE SURGERY     Family History:  Family History  Problem Relation Age of Onset  . Cancer Mother   . Cancer Maternal Grandmother   .  Diabetes Paternal Grandmother   . Stroke Paternal Grandmother   . Hypertension Father    Family Psychiatric  History:  Social History:  Social History   Substance and Sexual Activity  Alcohol Use Yes   Comment: occ      Social History   Substance and Sexual Activity  Drug Use No    Social History   Socioeconomic History  . Marital status: Single    Spouse name: Not on file  . Number of children: Not on file  . Years of education: Not on file  . Highest education level: Not on file  Occupational History  . Not on file  Social Needs  . Financial resource strain: Not on file  . Food insecurity:    Worry: Not on file    Inability: Not on file  . Transportation needs:    Medical: Not on file    Non-medical: Not on file  Tobacco Use  . Smoking status: Current Every Day Smoker    Packs/day: 0.50    Types: Cigarettes  . Smokeless tobacco: Never Used  Substance and Sexual Activity  . Alcohol use: Yes    Comment: occ  . Drug use: No  . Sexual activity: Yes    Birth control/protection: None  Lifestyle  . Physical activity:    Days per week: Not on file    Minutes per session: Not on file  . Stress: Not on file  Relationships  . Social connections:    Talks on phone: Not on file    Gets together: Not on file    Attends religious service: Not on file    Active member of club or organization: Not on file    Attends meetings of clubs or organizations: Not on file    Relationship status: Not on file  Other Topics Concern  . Not on file  Social History Narrative  . Not on file    Hospital Course:  Judy Graham was admitted for Bipolar I disorder, most recent episode (or current) depressed (HCC)  and crisis management.  Pt was treated discharged with the medications listed below under Medication List.  Medical problems were identified and treated as needed.  Home medications were restarted as appropriate.  Improvement was monitored by observation and Judy Graham 's daily report of symptom reduction.  Emotional and mental status was monitored by daily self-inventory reports completed by Judy Graham and clinical staff.         Judy Graham was evaluated by the treatment team for  stability and plans for continued recovery upon discharge. Judy Graham 's motivation was an integral factor for scheduling further treatment. Employment, transportation, bed availability, health status, family support, and any pending legal issues were also considered during hospital stay. Pt was offered further treatment options upon discharge including but not limited to Residential, Intensive Outpatient, and Outpatient treatment.  Kashayla Ungerer will follow up with the services as listed below under Follow Up Information.     Upon completion of this admission the patient was both mentally and medically stable for discharge denying suicidal/homicidal ideation, auditory/visual/tactile hallucinations, delusional thoughts and paranoia. Patient reports she is going to stay with her father at discharge.   Florestine Reznick responded well to treatment with Remeron 15 mg, Trazsdone and Vistaril 25 mg   without adverse effects.Pt demonstrated improvement without reported or observed adverse effects to the point of stability appropriate for outpatient management. Pertinent labs include: UDS + opiates   for which outpatient follow-up is  necessary for lab recheck as mentioned below. Reviewed CBC, CMP, BAL, and UDS; all unremarkable aside from noted exceptions.   Physical Findings: AIMS: Facial and Oral Movements Muscles of Facial Expression: None, normal Lips and Perioral Area: None, normal Jaw: None, normal Tongue: None, normal,Extremity Movements Upper (arms, wrists, hands, fingers): None, normal Lower (legs, knees, ankles, toes): None, normal, Trunk Movements Neck, shoulders, hips: None, normal, Overall Severity Severity of abnormal movements (highest score from questions above): None, normal Incapacitation due to abnormal movements: None, normal Patient's awareness of abnormal movements (rate only patient's report): No Awareness, Dental Status Current problems with teeth and/or dentures?: No Does  patient usually wear dentures?: No  CIWA:  CIWA-Ar Total: 1 COWS:  COWS Total Score: 2  Musculoskeletal: Strength & Muscle Tone: within normal limits Gait & Station: normal Patient leans: N/A  Psychiatric Specialty Exam: See SRA by MD Physical Exam  Vitals reviewed. Constitutional: She appears well-developed.  Neurological: She is alert.  Psychiatric: She has a normal mood and affect.    Review of Systems  Psychiatric/Behavioral: Negative for depression (improving ) and suicidal ideas. The patient is nervous/anxious. The patient does not have insomnia.     Blood pressure 101/69, pulse (!) 120, temperature 98.4 F (36.9 C), temperature source Oral, resp. rate 16, height 5\' 3"  (1.6 m), weight 36.3 kg (80 lb), SpO2 100 %, unknown if currently breastfeeding.Body mass index is 14.17 kg/m.   Have you used any form of tobacco in the last 30 days? (Cigarettes, Smokeless Tobacco, Cigars, and/or Pipes): Yes  Has this patient used any form of tobacco in the last 30 days? (Cigarettes, Smokeless Tobacco, Cigars, and/or Pipes) Yes, No  Blood Alcohol level:  Lab Results  Component Value Date   ETH <10 07/13/2017   ETH <10 06/25/2017    Metabolic Disorder Labs:  Lab Results  Component Value Date   HGBA1C 5.1 07/13/2017   MPG 99.67 07/13/2017   Lab Results  Component Value Date   PROLACTIN 56.2 (H) 07/13/2017   Lab Results  Component Value Date   CHOL 167 07/13/2017   TRIG 59 07/13/2017   HDL 73 07/13/2017   CHOLHDL 2.3 07/13/2017   VLDL 12 07/13/2017   LDLCALC 82 07/13/2017    See Psychiatric Specialty Exam and Suicide Risk Assessment completed by Attending Physician prior to discharge.  Discharge destination:  Home  Is patient on multiple antipsychotic therapies at discharge:  No   Has Patient had three or more failed trials of antipsychotic monotherapy by history:  No  Recommended Plan for Multiple Antipsychotic Therapies: NA  Discharge Instructions    Diet - low  sodium heart healthy   Complete by:  As directed    Discharge instructions   Complete by:  As directed    Take all medications as prescribed. Keep all follow-up appointments as scheduled.  Do not consume alcohol or use illegal drugs while on prescription medications. Report any adverse effects from your medications to your primary care provider promptly.  In the event of recurrent symptoms or worsening symptoms, call 911, a crisis hotline, or go to the nearest emergency department for evaluation.   Increase activity slowly   Complete by:  As directed      Allergies as of 07/17/2017      Reactions   Clindamycin/lincomycin Anaphylaxis, Swelling   Gabapentin Shortness Of Breath, Swelling   Flexeril [cyclobenzaprine] Other (See Comments)   Restless leg syndrome, numbness      Medication List    STOP  taking these medications   butalbital-acetaminophen-caffeine 50-325-40 MG tablet Commonly known as:  FIORICET, ESGIC   cycloSPORINE 0.05 % ophthalmic emulsion Commonly known as:  RESTASIS   diazepam 5 MG tablet Commonly known as:  VALIUM   famotidine 20 MG tablet Commonly known as:  PEPCID   HYDROcodone-acetaminophen 5-325 MG tablet Commonly known as:  NORCO/VICODIN   lidocaine 5 % Commonly known as:  LIDODERM   promethazine 25 MG tablet Commonly known as:  PHENERGAN     TAKE these medications     Indication  ferrous sulfate 325 (65 FE) MG tablet Take 1 tablet (325 mg total) by mouth daily with breakfast. Start taking on:  07/18/2017  Indication:  Iron Deficiency   FLINTSTONES GUMMIES PO Take 1 tablet by mouth daily.  Indication:  muitlvit   hydrOXYzine 50 MG tablet Commonly known as:  ATARAX/VISTARIL Take 1 tablet (50 mg total) by mouth every 6 (six) hours as needed (mild/moderate anxiety). What changed:    medication strength  how much to take  when to take this  Indication:  Feeling Anxious   mirtazapine 15 MG tablet Commonly known as:  REMERON Take 1  tablet (15 mg total) by mouth at bedtime.  Indication:  Major Depressive Disorder   nicotine polacrilex 2 MG gum Commonly known as:  NICORETTE Take 1 each (2 mg total) by mouth as needed for smoking cessation.  Indication:  Nicotine Addiction   OLANZapine 10 MG tablet Commonly known as:  ZYPREXA Take 1 tablet (10 mg total) by mouth at bedtime. What changed:    medication strength  how much to take  additional instructions  Indication:  Mood control   traZODone 50 MG tablet Commonly known as:  DESYREL Take 1 tablet (50 mg total) by mouth at bedtime as needed for sleep. What changed:    how much to take  how to take this  when to take this  reasons to take this  additional instructions  Indication:  Trouble Sleeping      Follow-up Information    [facility] Follow up on 07/25/2017.   Why:  Next Tuesday at 11:00 for your hospital follow up appointment.  Bring ID, MCD card and hospital d/c paperwork Contact information: [address] [place] [phone] F: [phone]          Follow-up recommendations:  Activity:  as tolerated  Diet:  heart healthy  Comments:Take all medications as prescribed. Keep all follow-up appointments as scheduled.  Do not consume alcohol or use illegal drugs while on prescription medications. Report any adverse effects from your medications to your primary care provider promptly.  In the event of recurrent symptoms or worsening symptoms, call 911, a crisis hotline, or go to the nearest emergency department for evaluation.    Signed: Oneta Rack, NP 07/17/2017, 9:47 AM   Patient seen, Suicide Assessment Completed.  Disposition Plan Reviewed

## 2017-07-17 NOTE — Progress Notes (Signed)
Recreation Therapy Notes  Date: 4.1.19 Time: 10:00 a.m Location: [address] Dayroom   Group Topic: Triggers   Goal Area(s) Addresses:  Goal 1.1: To identify triggers and coping skills  - Group will identify at least three triggers for anxiety  - Group will identify at least three coping skill for anxiety   - Group will identify at least three thoughts and physical symptoms for anxiety  - Group will participate in Recreation Therapy tx.   Behavioral Response: Appropriate   Intervention: Worksheet    Activity: Patients completed an anxiety worksheet, identifying triggers for anxiety, physical symptoms, thoughts, and ways to cope from triggers   Education: Triggers, Coping Skills   Education Outcome: Acknowledges Education  Clinical Observations/Feedback: Patient attended and participated appropriately during Recreation Therapy group session successfully identifying three triggers for anxiety. Patient was able to identify three physical symptoms and thoughts they have when they feel anxious. Patient was able to identify three positive coping skills to use when she feel anxious. Patient participated during opening and closing discussion. Patient successfully met Goal 1.1 (see above).   Ranell Patrick, Recreation Therapy Intern   Ranell Patrick 07/17/2017 10:47 AM

## 2017-07-17 NOTE — Progress Notes (Signed)
Discharge Note:  Patient discharged home and had her own car in parking lot.  Patient denied SI and HI.  Denied A/V hallucinations.  Suicide prevention information given and discussed with patient who stated she understood and had no questions.  Patient stated she received all her belongings, clothing, toiletries, misc items, prescriptions, medications, etc.  Patient stated she [facility] all [facility].  All required discharge information given to patient at discharge.

## 2017-07-17 NOTE — Progress Notes (Signed)
Recreation Therapy Notes  INPATIENT RECREATION TR PLAN  Patient Details Name: Judy Graham MRN: [id number] DOB: [birth year] Today's Date: 07/17/2017  Rec Therapy Plan Is patient appropriate for Therapeutic Recreation?: Yes Treatment times per week: At least three  Estimated Length of Stay: 5-7 days  TR Treatment/Interventions: Group participation (Appropriate participation in Recreation Therapy group tx.)  Discharge Criteria Pt will be discharged from therapy if:: Discharged Treatment plan/goals/alternatives discussed and agreed upon by:: Patient/family  Discharge Summary Short term goals set: See Care Plan  Short term goals met: Complete Progress toward goals comments: Groups attended Which groups?: Wellness, Leisure education, Triggers Therapeutic equipment acquired: None  Reason patient discharged from therapy: Discharge from hospital Pt/family agrees with progress & goals achieved: Yes Date patient discharged from therapy: 07/17/17  Ranell Patrick, Recreation Therapy Intern   Ranell Patrick 07/17/2017, 2:15 PM

## 2017-07-17 NOTE — Progress Notes (Signed)
D:  Patient denied SI and HI, contracts for safety.  Denied A/V hallucinations. A:  Medications administered per MD orders.  Patient refused multivitamin this morning.  Emotional support and encouragement given patient. R:  Safety maintained with 15 minute checks.   Patient looking forward to discharge today.

## 2017-07-17 NOTE — Plan of Care (Signed)
  Problem: Education: Goal: Mental status will improve Outcome: Progressing   Problem: Activity: Goal: Sleeping patterns will improve Outcome: Progressing   Problem: Coping: Goal: Ability to demonstrate self-control will improve Outcome: Progressing   Problem: Safety: Goal: Periods of time without injury will increase Outcome: Progressing   

## 2017-07-17 NOTE — [facility] Suicide Risk Assessment (Signed)
[facility] Discharge Suicide Risk Assessment   Principal Problem: Bipolar I disorder, most recent episode (or current) depressed [facility]) Discharge Diagnoses:  Patient Active Problem List   Diagnosis Date Noted  . Bipolar I disorder, most recent episode (or current) depressed (HCC) [F31.30] 07/12/2017  . Bipolar disorder (HCC) [F31.9] 07/02/2017  . Suicidal overdose (HCC) [T50.902A] 07/01/2017  . Depression [F32.9]   . Tricyclic overdose, intentional self-harm, initial encounter (HCC) [T43.012A] 06/25/2017  . Acute respiratory failure (HCC) [J96.00] 06/25/2017  . Chronic hepatitis C without hepatic coma (HCC) [B18.2] 04/28/2017  . 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 Abdominal pain [R10.9] 07/01/2016  . Chronic headache [R51] 06/17/2016  . Bipolar and related disorder (HCC) [F31.9] 06/05/2015  . IUGR (intrauterine growth restriction) affecting care of mother [O36.5990] 08/14/2014  . Cigarette smoker [F17.210]   . MRSA (methicillin resistant staph aureus) culture positive [Z22.322] 05/26/2014  . Abnormal findings on antenatal screening [O28.9]   . Elevated AFP [R77.2]   . Choroid plexus cyst of fetus [O35.0XX0]   . Abnormal MSAFP (maternal serum alpha-fetoprotein), elevated [O28.0]   . High risk pregnancy with high inhibin [O28.8, O09.899]   . Choroid plexus cyst [G93.0]   . UTI (urinary tract infection) in pregnancy in second trimester [O23.42] 03/08/2014  . Marijuana use [F12.90] 03/08/2014  . Abnormal quad screen [O28.0] 03/08/2014  . Late prenatal care affecting pregnancy in second trimester, antepartum [O09.32] 03/04/2014  . Scoliosis [M41.9] 03/04/2014  . Anxiety disorder [F41.9] 09/05/2013  . Adjustment disorder with mixed emotional features [F43.29] 09/01/2013  . S/P spinal fusion [Z98.1] 08/29/2011    Total Time spent with patient: 30 minutes  Musculoskeletal: Strength & Muscle Tone: within normal limits Gait & Station:  normal Patient leans: N/A  Psychiatric Specialty Exam: Review of Systems  Constitutional: Negative for chills and fever.  Respiratory: Negative for cough and shortness of breath.   Cardiovascular: Negative for chest pain.  Gastrointestinal: Negative for abdominal pain, heartburn, nausea and vomiting.  Psychiatric/Behavioral: Negative for depression, hallucinations, substance abuse and suicidal ideas.    Blood pressure 101/69, pulse (!) 120, temperature 98.4 F (36.9 C), temperature source Oral, resp. rate 16, height 5\' 3"  (1.6 m), weight 36.3 kg (80 lb), SpO2 100 %, unknown if currently breastfeeding.Body mass index is 14.17 kg/m.  General Appearance: Casual and Fairly Groomed  Patent attorney::  Good  Speech:  Clear and Coherent and Normal Rate  Volume:  Normal  Mood:  Euthymic  Affect:  Appropriate, Congruent and Constricted  Thought Process:  Coherent and Goal Directed  Orientation:  Full (Time, Place, and Person)  Thought Content:  Logical  Suicidal Thoughts:  No  Homicidal Thoughts:  No  Memory:  Immediate;   Fair Recent;   Fair Remote;   Fair  Judgement:  Fair  Insight:  Fair  Psychomotor Activity:  Normal  Concentration:  Fair  Recall:  [employer] of Knowledge:Fair  Language: Fair  Akathisia:  No  Handed:    AIMS (if indicated):     Assets:  Communication Skills Resilience Social Support  Sleep:  Number of Hours: 6  Cognition: WNL  ADL's:  Intact   Mental Status Per Nursing Assessment::   On Admission:     Demographic Factors:  Caucasian, Low socioeconomic status and Unemployed  Loss Factors: Financial problems/change in socioeconomic status  Historical Factors: Impulsivity  Risk Reduction Factors:   Positive social support, Positive therapeutic relationship and Positive coping skills or problem solving skills  Continued Clinical Symptoms:  Bipolar Disorder:   Depressive phase Alcohol/Substance Abuse/Dependencies More than one psychiatric  diagnosis Unstable or Poor Therapeutic Relationship  Cognitive Features That Contribute To Risk:  None    Suicide Risk:  Minimal: No identifiable suicidal ideation.  Patients presenting with no risk factors but with morbid ruminations; may be classified as minimal risk based on the severity of the depressive symptoms  Follow-up Information    Please follow up.         Subjective Data: Rodney Cruiseicole Kelley is a 27 y/o F with history of Bipolar I who was admitted voluntarily with worsening symptoms of depression, restrictive eating pattern, AH, and SI with multiple plans in the context of discovering that her husband has been cheating on her. Pt has recent relevant history of [facility] with discharge on 07/05/17. She reported that she has been adherent to her outpatient medication regimen. She was restarted on previous medication of olanzapine at higher dose than previous discharge, and she was also started on trial of remeron for mood symptoms and to stimulate appetite.  Upon evaluation today, pt shares, "I'm alright - still having some anxiety." She notes that overall her mood symptoms have improved during her stay. She denies SI/HI/AH/VH. She is sleeping well. Her appetite is fair. She denies any physical complaints. She is in agreement to continue her current regimen without changes. She is in agreement to have outpatient follow up. She plans to stay with her father after discharge rather than a shelter. She was able to engage in safety planning including plan to [facility] or contact emergency services if she feels unable to maintain her own safety or the safety of others. Pt had no further questions, comments, or concerns.   Plan Of Care/Follow-up recommendations:   -Discharge to outpatient level of care  -Bipolar I, current episode depressed             - Continue olanzapine 10mg  po qhs             - Continue remeron 15mg  po qhs  - Anxiety             - Continue atarax 50mg  po  q6h prn anxiety  - Anemia             - Continue ferrous sulfate 325mg  po qAM with breakfast  -Poor oral intake             - Continue Ensure 237mL BID between meals  -GERD             - Continue famotidine 20mg  po BID  - Insomnia             - Continue trazodone 50mg  po qhs prn insomnia  - Nausea             - Continue zofran 4mg  po q8h prn nausea  - Chronic back pain             - Continue home medication of Vicodin 5-325mg  1 tablet q6h moderate pain   Activity:  as tolerated Diet:  normal Tests:  NA Other:  See above for DC plan  Micheal Likenshristopher T Lanell Dubie, MD 07/17/2017, 9:34 AM

## 2017-07-17 NOTE — Progress Notes (Signed)
  [facility] :  Will you be returning to the same living situation after discharge:  No. Going to stay with father At discharge, do you have transportation home?: Yes,  car in parking lot Do you have the ability to pay for your medications: Yes,  MCD  Release of information consent forms completed and in the chart;  Patient's signature needed at discharge.  Patient to Follow up at: Follow-up Information    [facility] Follow up on 07/25/2017.   Why:  Next Tuesday at 11:00 for your hospital follow up appointment.  Bring ID, MCD card and hospital d/c paperwork Contact information: [address], [place] [phone] F: [phone]          Next level of care provider [facility]:no  Safety Planning and Suicide Prevention discussed: Yes,  yes  Have you used any form of tobacco in the last 30 days? (Cigarettes, Smokeless Tobacco, Cigars, and/or Pipes): Yes  Has patient been referred to the Quitline?: Patient refused referral  Patient has been referred for addiction treatment: N/A  Ida RogueRodney B Dung Prien, LCSW 07/17/2017, 9:37 AM

## 2017-07-17 NOTE — Plan of Care (Signed)
4.1.19. Patient attended an participated appropriately during Recreation Therapy group treatment successfully engaging in groups with a calm and appropriate mood at least 2x within 5 recreation therapy group sessions
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
## 2019-04-26 IMAGING — CR DG CHEST 1V PORT
1 series · 1 of 1 positions shown · non-contrast
Comparison: 10/08/2011

CLINICAL DATA: Overdose

EXAM:
PORTABLE CHEST 1 VIEW

[portable]
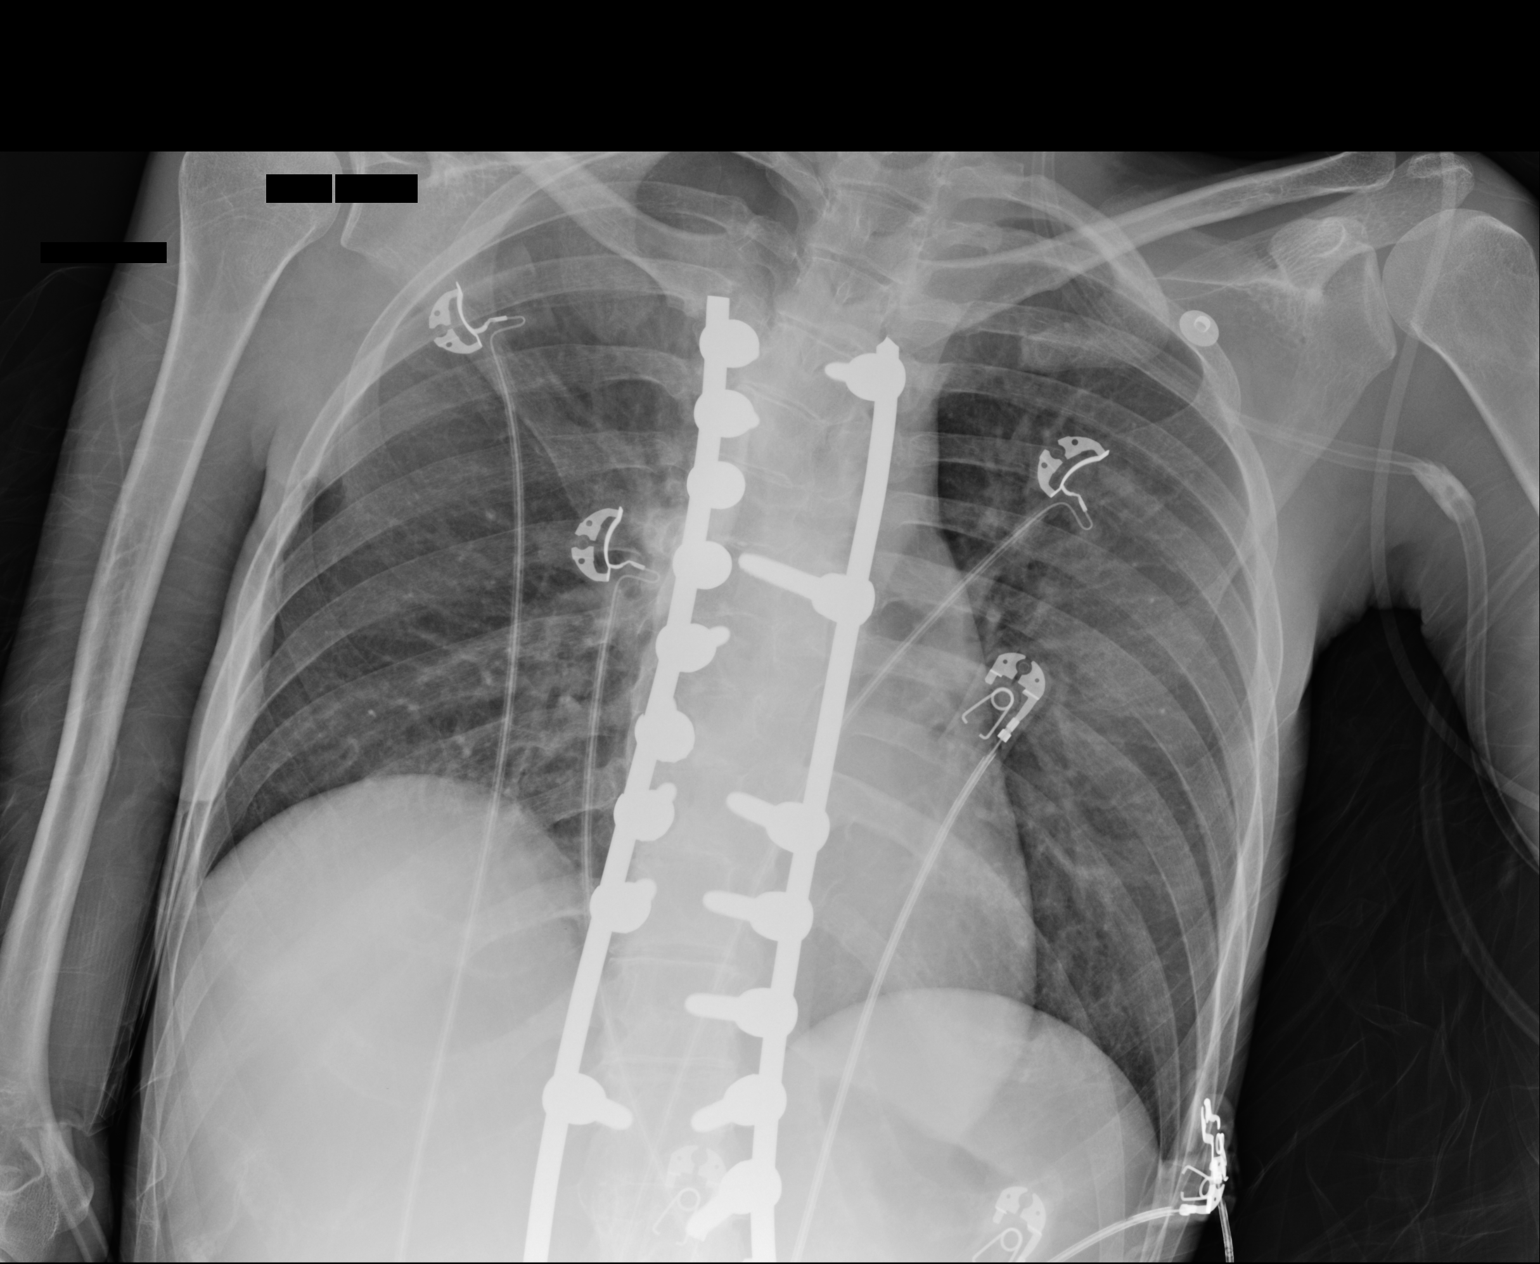

[1 of 1 positions shown; findings below may reference images not displayed]

FINDINGS: Normal heart size and mediastinal contours. Scoliosis with extensive
fixation hardware. Mildly low lung volumes. There is no edema,
consolidation, effusion, or pneumothorax.
IMPRESSION: No evidence of active disease.

## 2019-04-26 IMAGING — CR DG CHEST 1V PORT
1 series · 1 of 1 positions shown · non-contrast
Comparison: 06/25/2017 at 3333 hours

CLINICAL DATA: Status post endotracheal and orogastric tube
placement.

EXAM:
PORTABLE CHEST 1 VIEW

[portable]
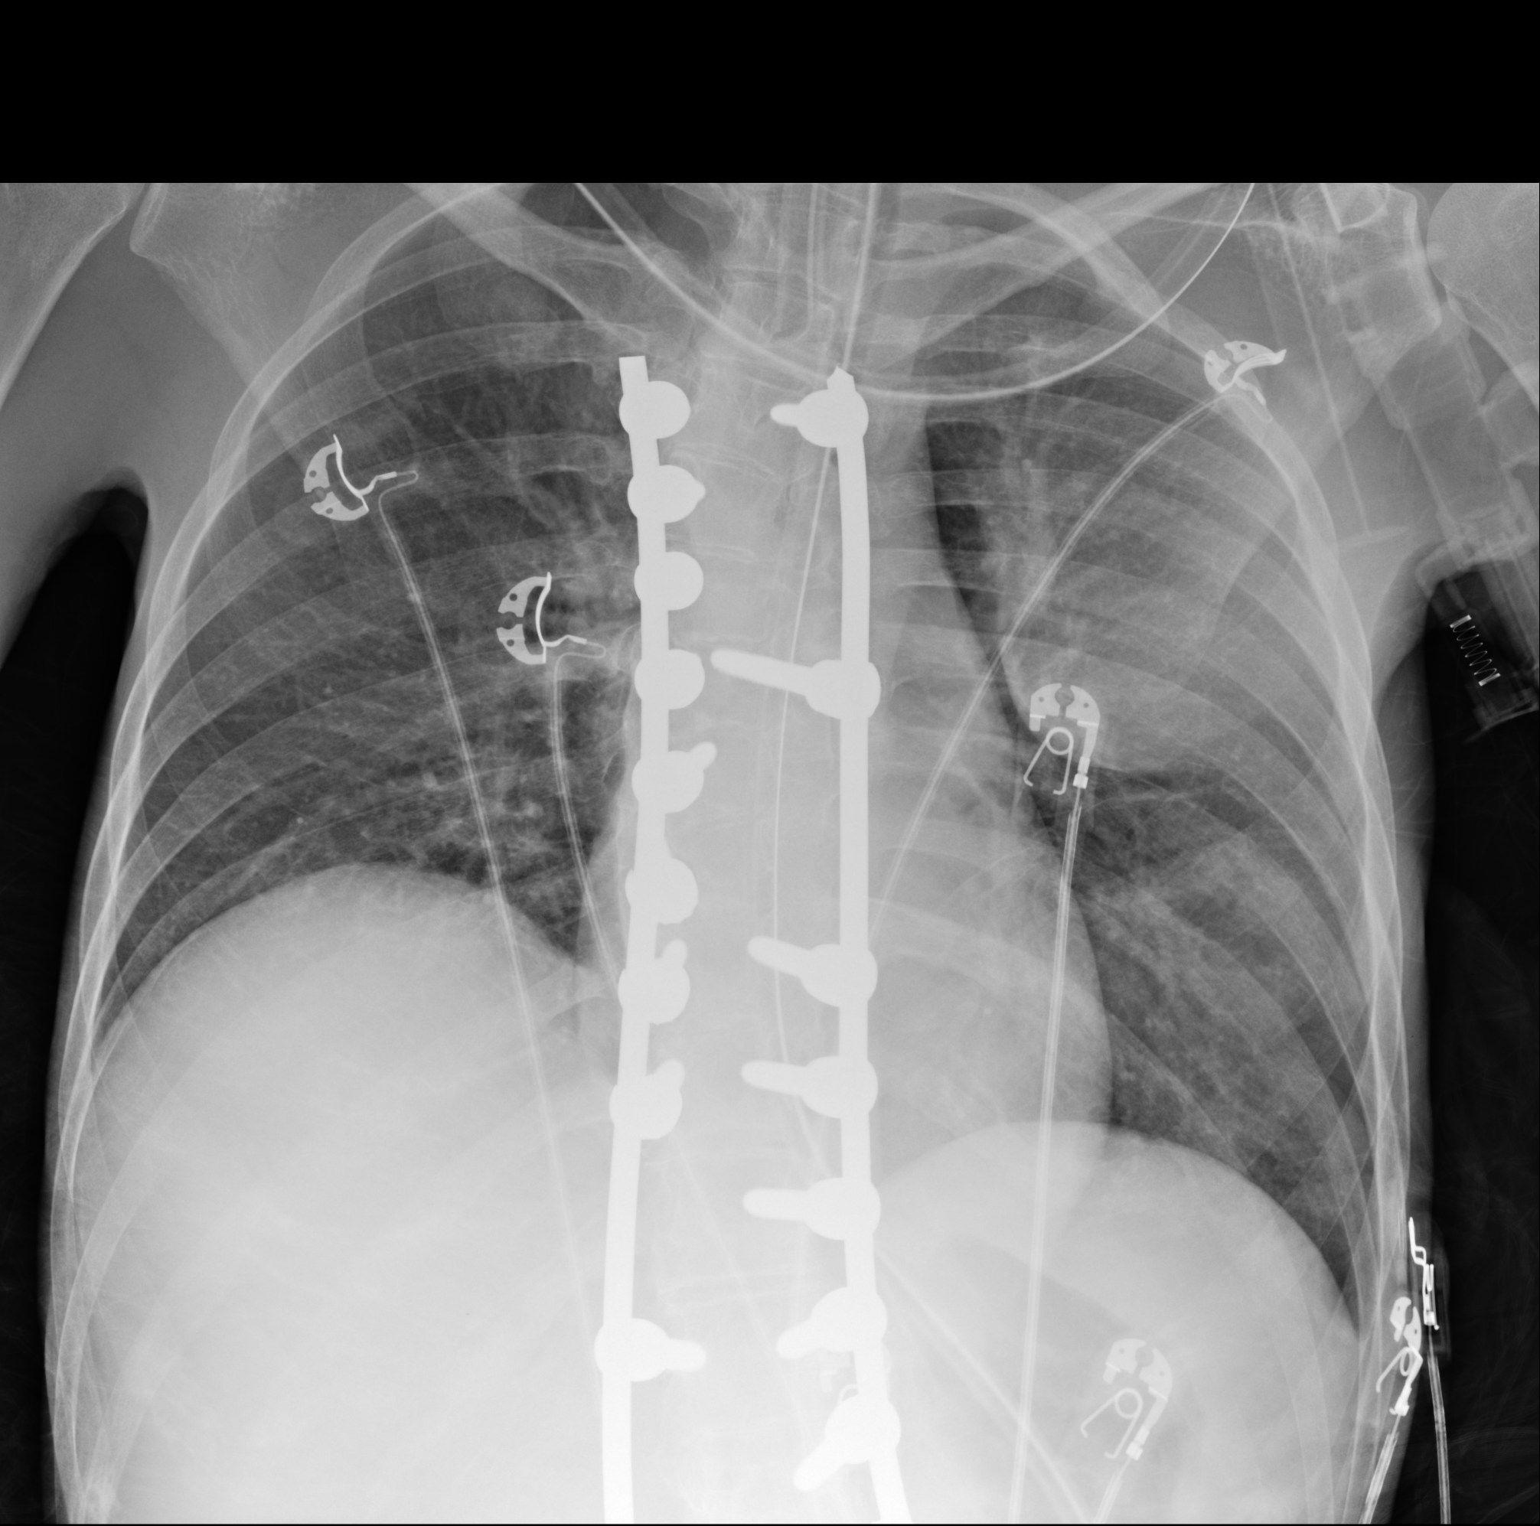

[1 of 1 positions shown; findings below may reference images not displayed]

FINDINGS: The endotracheal tube tip projects 4 cm above the carina.

Nasal/orogastric tube passes below the diaphragm well into the
stomach.

There is hazy opacity projecting in the left mid to upper lung,
which has developed since the prior study. Remainder of the lungs is
clear.

No pneumothorax or pleural effusion.
IMPRESSION: 1. Endotracheal tube tip 4 cm above the carinal. Nasal/orogastric
tube well positioned passing well below the diaphragm into the
stomach.
2. Patient has developed an area of airspace opacity in the left mid
to upper lung consistent with pneumonia in the proper clinical
setting.

## 2019-04-28 IMAGING — DX DG CHEST 1V PORT
1 series · 1 of 1 positions shown · non-contrast
Comparison: Portable chest x-ray of 06/26/2017

CLINICAL DATA: Acute respiratory failure, shortness of breath

EXAM:
PORTABLE CHEST 1 VIEW

[chest]
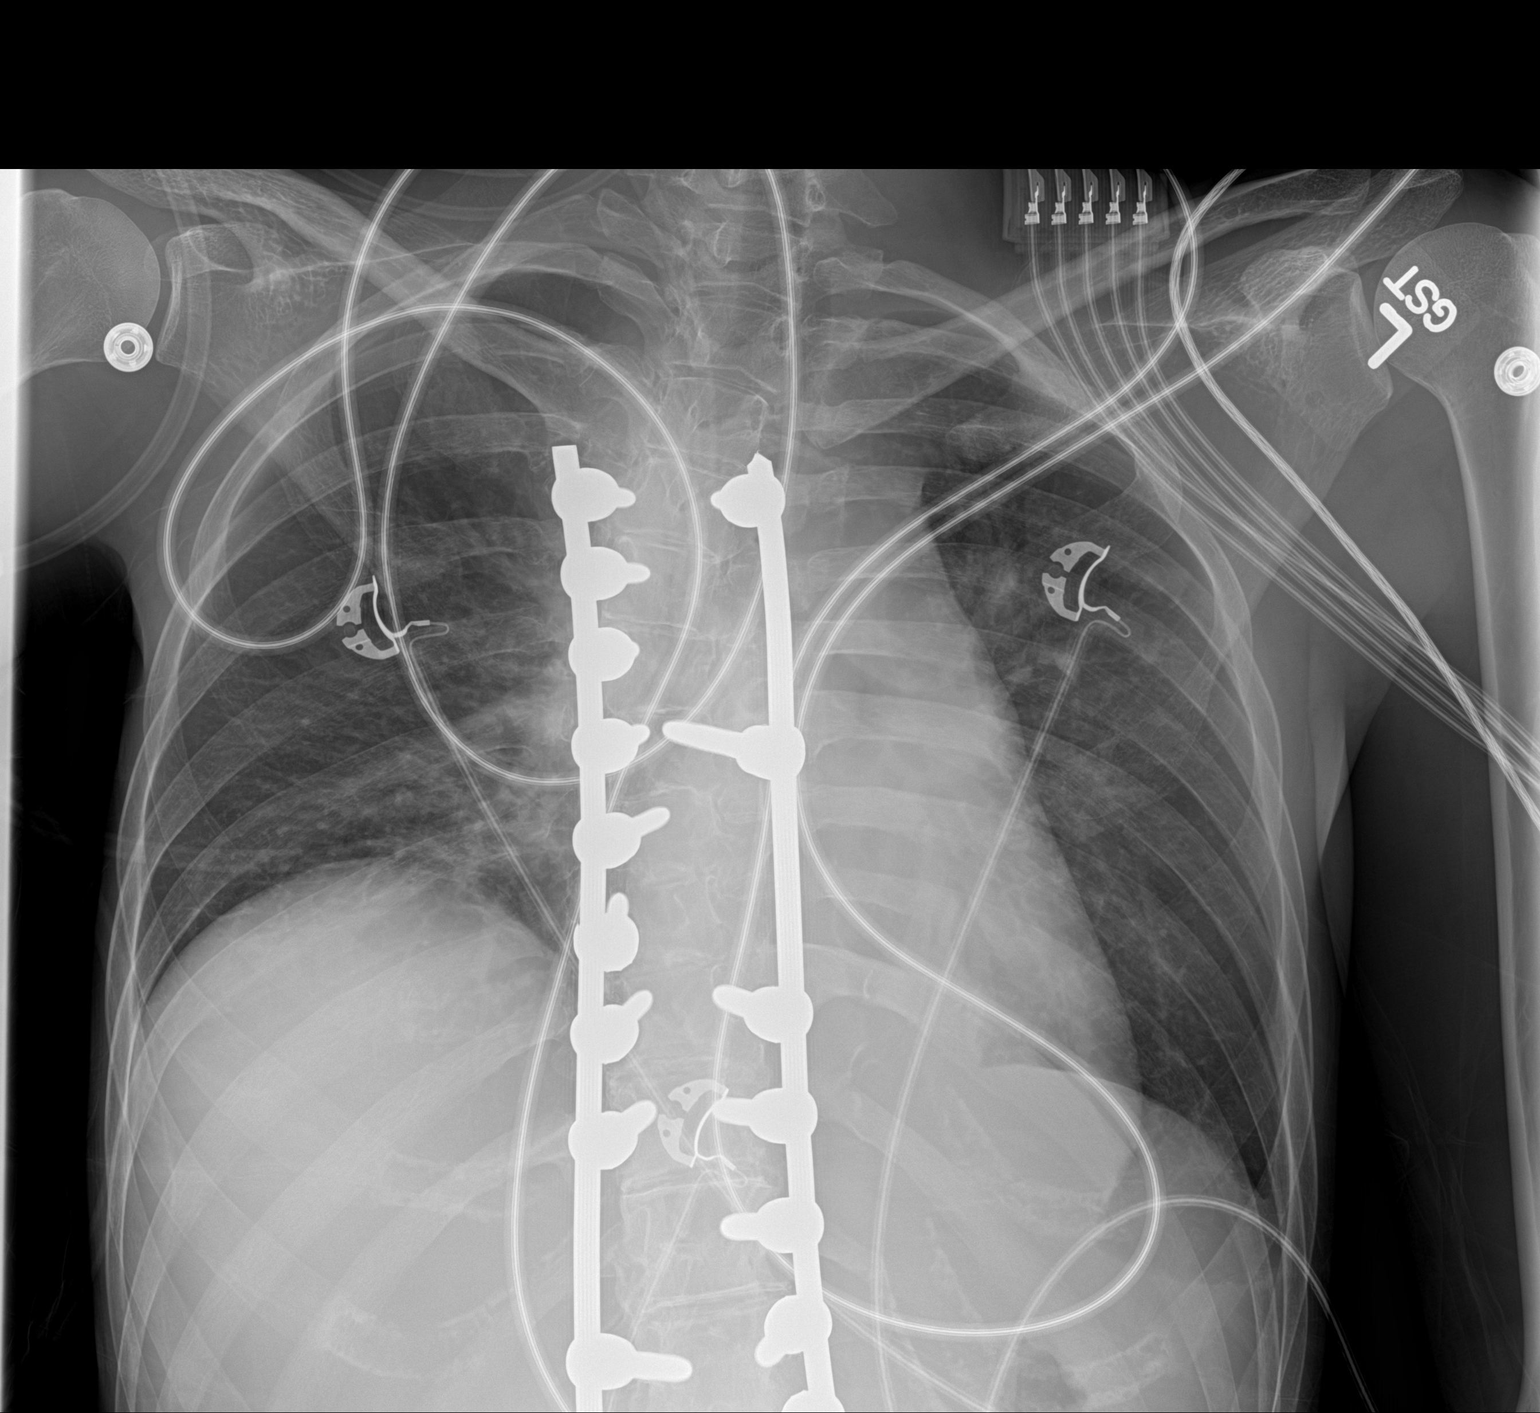

[1 of 1 positions shown; findings below may reference images not displayed]

FINDINGS: The opacity at the left lung base remains medially and is worrisome
for a focus of pneumonia. Mild volume loss remains at the right lung
base. No definite pleural effusion is seen. Harrington rods are
noted overlying the thoracolumbar spine.
IMPRESSION: Persistent medial left basilar opacity most consistent with
pneumonia. Recommend continued follow-up.

## 2019-04-29 IMAGING — DX DG CHEST 1V PORT
1 series · 1 of 1 positions shown · non-contrast
Comparison: June 27, 2017

CLINICAL DATA: Respiratory failure

EXAM:
PORTABLE CHEST 1 VIEW

[chest]
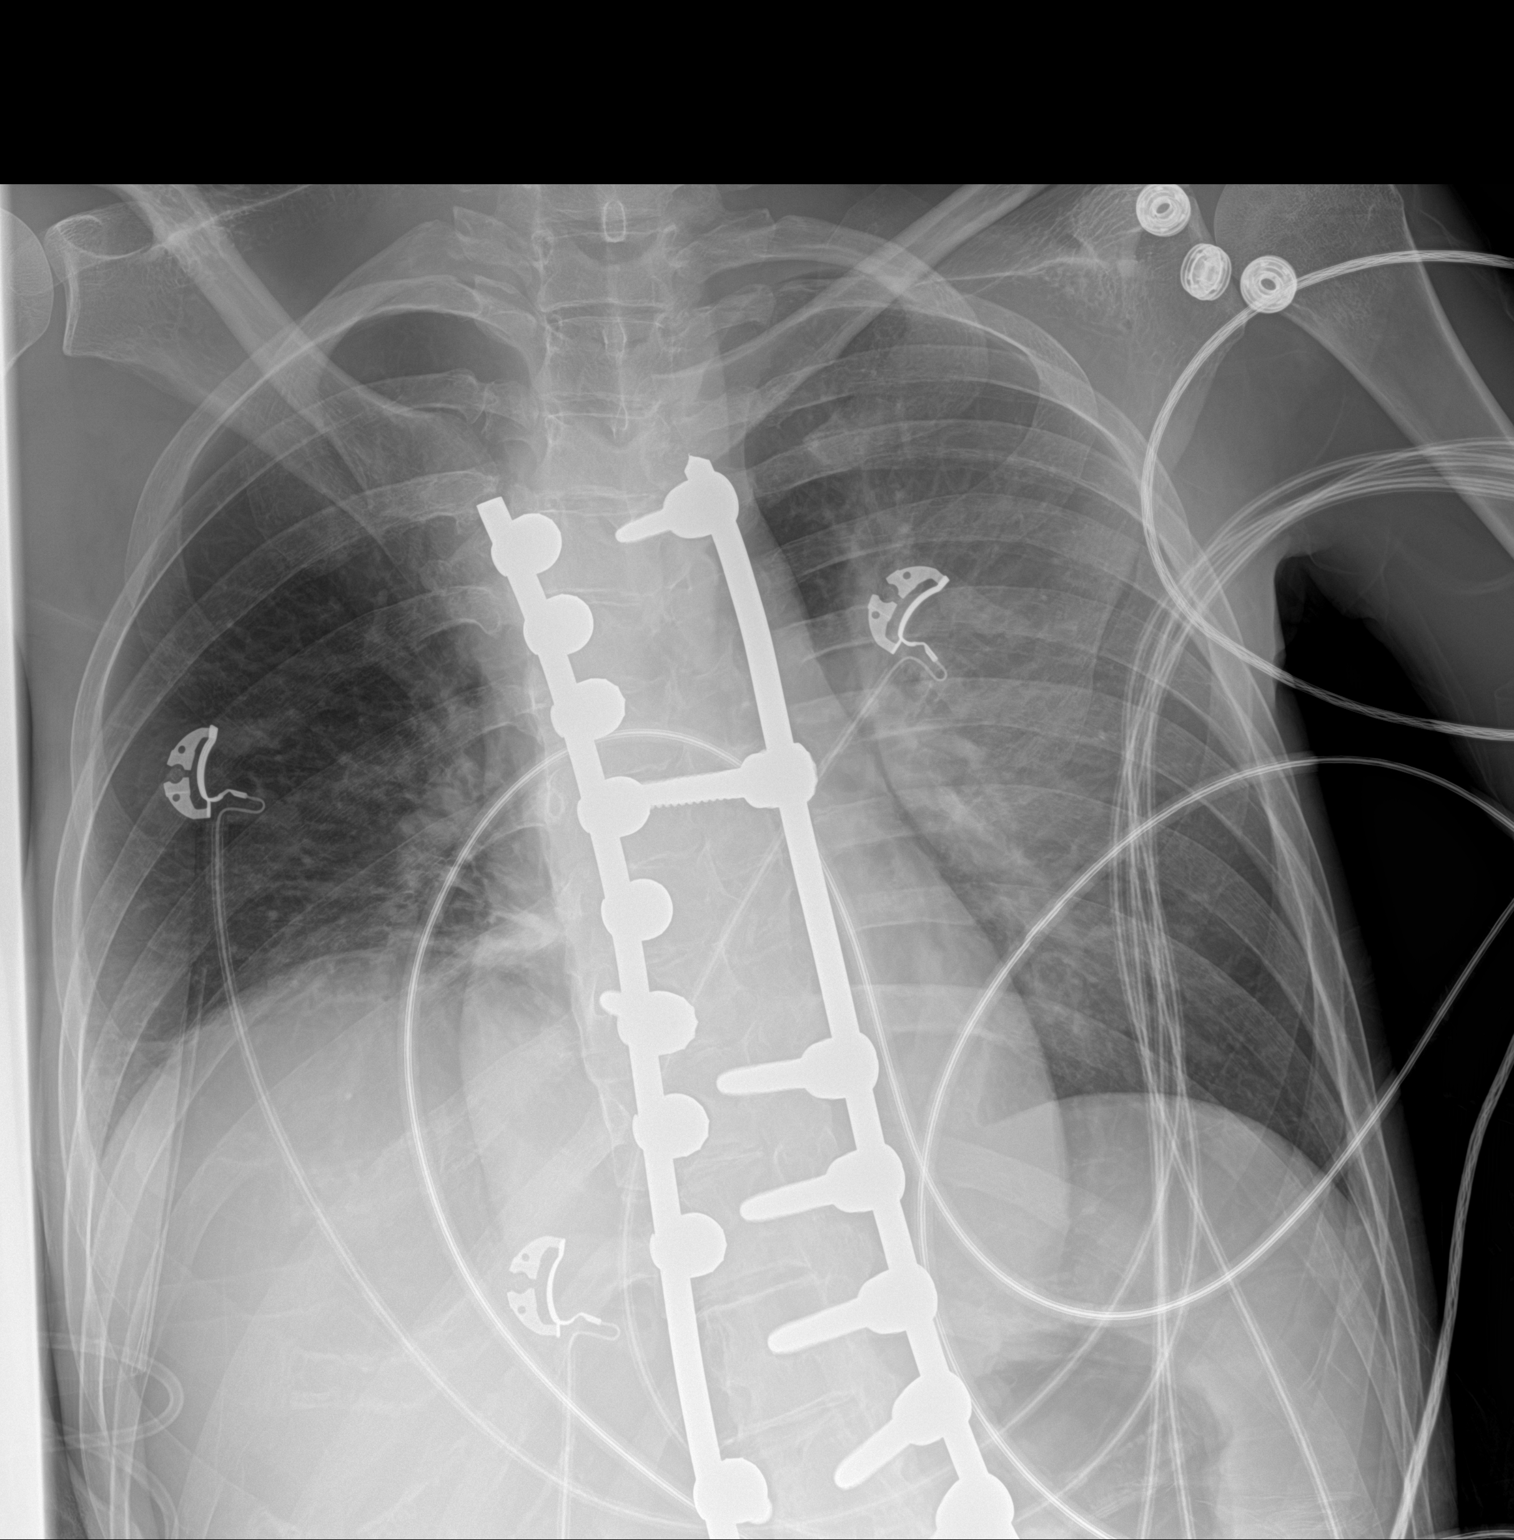

[1 of 1 positions shown; findings below may reference images not displayed]

FINDINGS: Opacity in the medial left base is stable and likely due to focal
consolidation/pneumonia. There is atelectatic change in the right
base with small right pleural effusion. Lungs elsewhere are clear.
Heart is upper normal in size with pulmonary vascularity within
normal limits. No adenopathy. Postoperative change noted in the
thoracic and upper lumbar spine regions.
IMPRESSION: Persistent opacity medial left base concerning for focal pneumonia.
Right base atelectasis. No new opacity. Stable cardiac silhouette.
